# Patient Record
Sex: Male | Born: 1950 | Race: Black or African American | Hispanic: No | Marital: Married | Smoking: Former smoker
Health system: Southern US, Community
[De-identification: ages and names within clinical notes are randomized; demographics above are authoritative.]

## PROBLEM LIST (undated history)

## (undated) DIAGNOSIS — G959 Disease of spinal cord, unspecified: Secondary | ICD-10-CM

## (undated) DIAGNOSIS — I10 Essential (primary) hypertension: Secondary | ICD-10-CM

## (undated) DIAGNOSIS — N200 Calculus of kidney: Secondary | ICD-10-CM

## (undated) DIAGNOSIS — T783XXA Angioneurotic edema, initial encounter: Secondary | ICD-10-CM

## (undated) DIAGNOSIS — G245 Blepharospasm: Secondary | ICD-10-CM

## (undated) DIAGNOSIS — N400 Enlarged prostate without lower urinary tract symptoms: Secondary | ICD-10-CM

## (undated) DIAGNOSIS — H469 Unspecified optic neuritis: Secondary | ICD-10-CM

## (undated) DIAGNOSIS — E785 Hyperlipidemia, unspecified: Secondary | ICD-10-CM

## (undated) DIAGNOSIS — L509 Urticaria, unspecified: Secondary | ICD-10-CM

## (undated) HISTORY — DX: Disease of spinal cord, unspecified: G95.9

## (undated) HISTORY — DX: Benign prostatic hyperplasia without lower urinary tract symptoms: N40.0

## (undated) HISTORY — DX: Angioneurotic edema, initial encounter: T78.3XXA

## (undated) HISTORY — DX: Urticaria, unspecified: L50.9

## (undated) HISTORY — DX: Blepharospasm: G24.5

## (undated) HISTORY — DX: Calculus of kidney: N20.0

## (undated) HISTORY — DX: Unspecified optic neuritis: H46.9

## (undated) HISTORY — DX: Hyperlipidemia, unspecified: E78.5

## (undated) HISTORY — PX: TONSILLECTOMY: SUR1361

---

## 2013-04-08 HISTORY — PX: NECK SURGERY: SHX720

## 2013-06-03 ENCOUNTER — Ambulatory Visit (HOSPITAL_COMMUNITY): Payer: Self-pay | Admitting: Physical Therapy

## 2013-06-07 ENCOUNTER — Ambulatory Visit (HOSPITAL_COMMUNITY)
Admission: RE | Admit: 2013-06-07 | Discharge: 2013-06-07 | Disposition: A | Discharge status: Home / Self Care | Payer: Non-veteran care | Source: Ambulatory Visit | Attending: Nurse Practitioner | Admitting: Nurse Practitioner

## 2013-06-07 DIAGNOSIS — IMO0001 Reserved for inherently not codable concepts without codable children: Secondary | ICD-10-CM | POA: Insufficient documentation

## 2013-06-07 DIAGNOSIS — M2569 Stiffness of other specified joint, not elsewhere classified: Secondary | ICD-10-CM | POA: Insufficient documentation

## 2013-06-07 DIAGNOSIS — M62838 Other muscle spasm: Secondary | ICD-10-CM

## 2013-06-07 DIAGNOSIS — M5380 Other specified dorsopathies, site unspecified: Secondary | ICD-10-CM | POA: Insufficient documentation

## 2013-06-07 DIAGNOSIS — M2560 Stiffness of unspecified joint, not elsewhere classified: Secondary | ICD-10-CM

## 2013-06-07 DIAGNOSIS — M542 Cervicalgia: Secondary | ICD-10-CM | POA: Insufficient documentation

## 2013-06-07 DIAGNOSIS — R209 Unspecified disturbances of skin sensation: Secondary | ICD-10-CM | POA: Insufficient documentation

## 2013-06-07 NOTE — Evaluation (Signed)
Physical Therapy Evaluation  Patient Details  Name: Randy [name] MRN: [id number] Date of Birth: 1950/04/18  Today's Date: 06/07/2013 Time: 1015-1100 PT Time Calculation (min): 45 min Charge: Evaluation 1015-1100             Visit#: 1 of 9  Re-eval: 07/07/13 Assessment Diagnosis: cervical laminectomy Surgical Date: 04/15/13 Next MD Visit: 06/16/2013  [facility]      Authorization Visit#: 1 of 9 (8 visits after initial evaluation approved.)   Past Medical History: No past medical history on file. Past Surgical History: No past surgical history on file.  Subjective Symptoms/Limitations Symptoms: Randy [name] states that he initially had numbness in his fingers and they thought he had carpal tunnel.  He was not improving so they completed an MRI which showed severe spinal stenosis.  He had surgery on 1/8/205 for this and is now being referred for theapy.  Randy [name] states that he fells like his neck is stiff and his hands feel like hard plastic.  He is taking pain medication and mm relaxors on a daily basis  when he does not take his medication the pain is severe but with the medication the pain is tolerable.  He states he has quite a bit of restriction in his arms as far as motion and strength the right being greater than the left.  (Pt does have a referral for OT for B UE ROM and strengthening.).  He staes he has shooting pains going down his arms a couple of times a day.   Pertinent History: C3-C7 posterior laminectomy on 04/15/2013.  Pt is to have only gentle ROM for Cervical spine to prevent further loss of ROM.    How long can you sit comfortably?: Pt begins to shift his weight after about 20-30 minutes. How long can you stand comfortably?: Pt has increased pain after standing for 5-10 minutes  How long can you walk comfortably?: Pt states he is walking ten minutes three times a day.  Pain Assessment Currently in Pain?: Yes Pain Score: 2  (worst pain is as high as  5/10) Pain Location: Neck Pain Orientation: Right Pain Type: Surgical pain Pain Radiating Towards: B UE Pain Relieving Factors: lying down  Effect of Pain on Daily Activities: increases pain    Balance Screening Balance Screen Has the patient fallen in the past 6 months: No  Prior Functioning  Prior Function Driving: Yes (limited) Vocation: Retired Leisure: Hobbies-yes (Comment) Comments: dancing,shopping    Sensation/Coordination/Flexibility/Functional Tests Functional Tests Functional Tests: FS 41  Assessment Cervical AROM Cervical Flexion: 35 degrees Cervical Extension: 55 degrees Cervical - Right Side Bend: 18 Cervical - Left Side Bend: 20 Cervical - Right Rotation: 40 Cervical - Left Rotation: 35 Palpation Palpation: mm spasm  B mid trap area   Exercise/Treatments Mobility/Balance  Posture/Postural Control Posture/Postural Control: Postural limitations Postural Limitations: Pt stands with 20 degrees forward bend; forward head and rounded shoulders   Seated Exercises Shoulder Shrugs: 5 reps Other Seated Exercise: Cervical ROM x 5 for all; scapular retraction x 10     Physical Therapy Assessment and Plan PT Assessment and Plan Clinical Impression Statement: Randy [name] is a 63 yo male who had cervical laminectomies C 3-C7 on 04/15/2013.  He is being referred for gentle ROM for his cevical spine.  Pt eval demonstrates decreased ROM, decreased posture, and marked mm spasms in the mid trapezius B.  Mr Sampson [name] will benefit from skilled PT to educate on the importance of posture; improve his ROM and pain  and decrease his mm spasm to allow him to decrease the use of pain medication amd mm relaxors.   Pt will benefit from skilled therapeutic intervention in order to improve on the following deficits: Decreased activity tolerance;Decreased strength;Pain;Decreased range of motion;Increased fascial restricitons;Improper spinal/pelvic alignment Rehab Potential: Good PT  Frequency: Min 2X/week PT Duration: 4 weeks PT Treatment/Interventions: Therapeutic exercise;Patient/family education;Manual techniques;Modalities PT Plan: Pt to continue ROM exercises as well as postural stabillity exercises; manual and modalities to cervical area to reduce mm spasm and pain.      Goals Home Exercise Program Pt/caregiver will Perform Home Exercise Program: For increased ROM PT Goal: Perform Home Exercise Program - Progress: Goal set today PT Short Term Goals Time to Complete Short Term Goals: 2 weeks PT Short Term Goal 1: Pt to be comfortable sitting x 30 minutes in order to eat a meal  PT Short Term Goal 2: Pt to be able to stand for 20 minutes to be able to make a small meal and/or socialize. PT Short Term Goal 3: Pt mm spasm to be moderate to allow improved ROM in cervical spine PT Short Term Goal 4: Pt increase rotation to 45 degree to allow safer driving PT Long Term Goals Time to Complete Long Term Goals: 4 weeks PT Long Term Goal 1: I in advance HEP PT Long Term Goal 2: Pt to be able to stand for 40 mintues to make an indepth meal/socialize Long Term Goal 3: Pt standing posture to not to be forward bent any greater than five degrees Long Term Goal 4: Pt to be able to complete functional tasks with UE while stabilizing his spine to allow pain level to decrease to the point pt is not taking pain medication on a regular basis. PT Long Term Goal 5: ROM of cervical spine to be to 50 degrees to allow safer driving.  Problem List Patient Active Problem List   Diagnosis Date Noted  . Stiffness of joints, not elsewhere classified, multiple sites 06/07/2013  . Cervical pain (neck) 06/07/2013  . Spasm of muscle 06/07/2013    PT Plan of Care PT Home Exercise Plan: given  GP Functional Assessment Tool Used: foto Functional Limitation: Other PT primary Other PT Primary Current Status (R6045): At least 40 percent but less than 60 percent impaired, limited or  restricted Other PT Primary Goal Status (W0981): At least 20 percent but less than 40 percent impaired, limited or restricted  [name],CINDY 06/07/2013, 1:27 PM  Physician Documentation Your signature is required to indicate approval of the treatment plan as stated above.  Please sign and either send electronically or make a copy of this report for your files and return this physician signed original.   Please mark one 1.__approve of plan  2. ___approve of plan with the following conditions.   ______________________________                                                          _____________________ Physician Signature  Date  

## 2013-06-09 ENCOUNTER — Ambulatory Visit (HOSPITAL_COMMUNITY)
Admission: RE | Admit: 2013-06-09 | Discharge: 2013-06-09 | Disposition: A | Discharge status: Home / Self Care | Payer: Non-veteran care | Source: Ambulatory Visit | Attending: Nurse Practitioner | Admitting: Nurse Practitioner

## 2013-06-09 NOTE — Progress Notes (Signed)
Physical Therapy Treatment Patient Details  Name: Randy [name] MRN: [id number] Date of Birth: [birth year]  Today's Date: 06/09/2013 Time: 0932-1018 PT Time Calculation (min): 46 min Charges: 30min, 9:32- 10:02, 2 units Therapeutic exercise; 16min, 10:03-10:18, 1 unit Manual therapy  Visit#: 2 of 9  Re-eval: 07/07/13  [facility]   Authorization Visit#: 2 of 9 (8 visits after initial evaluation approved.)   Subjective: Pain Assessment Pain Score: 5  Pain Location: Neck Pain Type: Surgical pain Pain Radiating Towards: B UE, Numbness and tingling  Exercise/Treatments Stretches Upper Trapezius Stretch: 3 reps;30 seconds (pain at end range) Standing Exercises Neck Retraction: 10 reps;5 secs Other Standing Exercises: At wall: Cervical retraction in to towel, with abdominal contraction, and Bilateral UE flexion x10 Seated Exercises Other Seated Exercise: Cervical ROM x 5 for all; scapular retraction x 10 (pain with c-xpine extension)  Manual Therapy Manual Therapy: Myofascial release Myofascial Release: MFR completed to bilateral mid and upper trapezius to decrease fascial restrictions; scar massage completed to incision area to improve mobility and decrease pain.  Physical Therapy Assessment and Plan PT Assessment and Plan Clinical Impression Statement: PTA facilitated strengthening and stretching activities to improve postural strength and cervical mobility. Pt requires multimodal cueing to improve form with scapular strengthening. Significant tightness and fascial restrictions noted throughout bilateral mid and upper trap, right > left. Restrictions also noted throughout scalenes. Pt reports pain decrease to 3-4/10 at end of session. Pt will benefit from skilled therapeutic intervention in order to improve on the following deficits: Decreased activity tolerance;Decreased strength;Pain;Decreased range of motion;Increased fascial restricitons;Improper spinal/pelvic  alignment Rehab Potential: Good PT Frequency: Min 2X/week PT Duration: 4 weeks PT Treatment/Interventions: Therapeutic exercise;Patient/family education;Manual techniques;Modalities PT Plan: Pt to continue ROM exercises as well as postural stabillity exercises specifically addre4ssing limited Pectoral, upper trapezius and scalene muscle mobility; manual and modalities to cervical area to reduce mm spasm and pain. Attempt ground up AROM exercises to imrpove pain free cervical ROM.      Problem List Patient Active Problem List   Diagnosis Date Noted  . Stiffness of joints, not elsewhere classified, multiple sites 06/07/2013  . Cervical pain (neck) 06/07/2013  . Spasm of muscle 06/07/2013    PT - End of Session Activity Tolerance: Patient tolerated treatment well General Behavior During Therapy: [facility] SWFL for tasks assessed/performed  DeWitt, Cash DPT, PT  Seth Bakeebekah Carnisha Feltz, PTA 06/09/2013, 10:29 AM

## 2013-06-14 ENCOUNTER — Ambulatory Visit (HOSPITAL_COMMUNITY)
Admission: RE | Admit: 2013-06-14 | Discharge: 2013-06-14 | Disposition: A | Discharge status: Home / Self Care | Payer: Non-veteran care | Source: Ambulatory Visit | Attending: Nurse Practitioner | Admitting: Nurse Practitioner

## 2013-06-14 NOTE — Progress Notes (Signed)
Physical Therapy Treatment Patient Details  Name: Randy [name] MRN: [id number] Date of Birth: 01-27-1951  Today's Date: 06/14/2013 Time: 0930-1015 PT Time Calculation (min): 45 min Charges: Therex x 40'([id number]'(0934-1004) Manual x 10'(1005-1010)  Visit#: 3 of 9  Re-eval: 07/07/13  [facility]   Authorization Visit#: 3 of 9 (8 visits after initial evaluation approved.)   Subjective: Symptoms/Limitations Symptoms: Pt reports HEP compliance. Pt states that he is having more pain in right hip that anywhere. Pain Assessment Currently in Pain?: Yes Pain Score: 5  Pain Location: Neck Pain Orientation: Right Pain Radiating Towards: R hand Multiple Pain Sites: Yes   Exercise/Treatments Stretches Corner Stretch: 3 reps;Limitations Corner Stretch Limitations: 15" holds Standing Exercises Upper Extremity Flexion with Stabilization: 10 reps Other Standing Exercises: Angel wings x 10 Seated Exercises Cervical Rotation: 10 reps Lateral Flexion: 10 reps X to V: 15 reps W Back: 10 reps Other Seated Exercise: Cervical extension/flexion x 10   Manual Therapy Manual Therapy: Myofascial release Myofascial Release: MFR completed to bilateral mid and upper trapezius to decrease fascial restrictions; scar massage completed to incision area to improve mobility and decrease pain.  Physical Therapy Assessment and Plan PT Assessment and Plan Clinical Impression Statement: Pt completes therex well after initial cueing and demo. Pt displays improved cervical and shoulder mobility this session. Continued with manual techniques to decrease fascial restrictions and pain. Pt reports decrease cervical pain at end of session. Pt will benefit from skilled therapeutic intervention in order to improve on the following deficits: Decreased activity tolerance;Decreased strength;Pain;Decreased range of motion;Increased fascial restricitons;Improper spinal/pelvic alignment Rehab Potential: Good PT Frequency:  Min 2X/week PT Duration: 4 weeks PT Treatment/Interventions: Therapeutic exercise;Patient/family education;Manual techniques;Modalities PT Plan: Continue to progress ROM exercises as well as postural stability exercises specifically addressing limited pectoral, upper trapezius and scalene muscle mobility.     Problem List Patient Active Problem List   Diagnosis Date Noted  . Stiffness of joints, not elsewhere classified, multiple sites 06/07/2013  . Cervical pain (neck) 06/07/2013  . Spasm of muscle 06/07/2013    PT - End of Session Activity Tolerance: Patient tolerated treatment well General Behavior During Therapy: [facility] tasks assessed/performed  Seth Bakeebekah Keison Glendinning, PTA  06/14/2013, 10:30 AM

## 2013-06-16 ENCOUNTER — Ambulatory Visit (HOSPITAL_COMMUNITY): Payer: Non-veteran care | Admitting: *Deleted

## 2013-06-18 ENCOUNTER — Ambulatory Visit (HOSPITAL_COMMUNITY)
Admission: RE | Admit: 2013-06-18 | Discharge: 2013-06-18 | Disposition: A | Discharge status: Home / Self Care | Payer: Non-veteran care | Source: Ambulatory Visit | Attending: Nurse Practitioner | Admitting: Nurse Practitioner

## 2013-06-18 DIAGNOSIS — M2560 Stiffness of unspecified joint, not elsewhere classified: Secondary | ICD-10-CM

## 2013-06-18 DIAGNOSIS — M542 Cervicalgia: Secondary | ICD-10-CM

## 2013-06-18 DIAGNOSIS — M62838 Other muscle spasm: Secondary | ICD-10-CM

## 2013-06-18 NOTE — Progress Notes (Signed)
Physical Therapy Treatment Patient Details  Name: Randy [name] MRN: [id number] Date of Birth: 28-Mar-1951  Today's Date: 06/18/2013 Time: 1515-1600 PT Time Calculation (min): 45 min 1515- 1545 manual  0174 - 1600 TE  Visit#: 4 of 9  Re-eval: 07/07/13 Assessment Diagnosis: cervical laminectomy Surgical Date: 04/15/13 Next MD Visit: 06/16/2013  [facility]: [facility]   Authorization Time Period:    Authorization Visit#: 4 of 9   Subjective: Symptoms/Limitations Symptoms: denies neck pain but reports ongoing right hip pain, reports UE use is improving  Pertinent History: C3-C7 posterior laminectomy on 04/15/2013.  Pt is to have only gentle ROM for Cervical spine to prevent further loss of ROM.        Exercise/Treatment    Environmental education officer: 3 reps;Limitations Warehouse manager Limitations: 15" holds, verbal cues for form  Machines for Strengthening UBE (Upper Arm Bike): 3 min forward, 3 min retro    Standing Exercises Other Standing Exercises: Angel wings x 10 Other Standing Exercises: standing shoulder extension at wall with scap retraction 10x  Seated Exercises Cervical Rotation: 10 reps Lateral Flexion: 10 reps W Back: 10 reps Shoulder Shrugs: 10 reps;Limitations Shoulder Shrugs Limitations: supine scap elevation and depression  Other Seated Exercise: Cervical extension/flexion x 10  Supine Exercises Other Supine Exercise: supine shoulder protraction/retractions 10x,      Manual Therapy Manual Therapy: Myofascial release Myofascial Release: MFR to Bil upper trap to decrease fascial restrictions, scar massage, gentle MFR to pectoral muscles, PROM supine rotations, gentle PROM side bending x 30 min   Physical Therapy Assessment and Plan PT Assessment and Plan Clinical Impression Statement: patient has notable spasm in B UE , however cervical ROM  Is mprovnig along with functional use of his UE, he did need min assist to doff shirt, abel to place hands behind and on  top of head , PT Plan: Continue to progress ROM exercises as well as postural stability exercises specifically addressing limited pectoral, upper trapezius and scalene muscle mobility.    Goals PT Short Term Goals Time to Complete Short Term Goals: 2 weeks PT Short Term Goal 1: Pt to be comfortable sitting x 30 minutes in order to eat a meal  PT Short Term Goal 1 - Progress: Progressing toward goal PT Short Term Goal 2: Pt to be able to stand for 20 minutes to be able to make a small meal and/or socialize. PT Short Term Goal 2 - Progress: Progressing toward goal PT Short Term Goal 3: Pt mm spasm to be moderate to allow improved ROM in cervical spine PT Short Term Goal 3 - Progress: Progressing toward goal PT Short Term Goal 4 - Progress: Met PT Long Term Goals Time to Complete Long Term Goals: 4 weeks PT Long Term Goal 1: I in advance HEP PT Long Term Goal 1 - Progress: Progressing toward goal PT Long Term Goal 2: Pt to be able to stand for 40 mintues to make an indepth meal/socialize PT Long Term Goal 2 - Progress: Progressing toward goal Long Term Goal 3: Pt standing posture to not to be forward bent any greater than five degrees Long Term Goal 3 Progress: Progressing toward goal Long Term Goal 4: Pt to be able to complete functional tasks with UE while stabilizing his spine to allow pain level to decrease to the point pt is not taking pain medication on a regular basis. Long Term Goal 4 Progress: Progressing toward goal PT Long Term Goal 5: ROM of cervical spine to be to 50 degrees  to allow safer driving. Long Term Goal 5 Progress: Progressing toward goal  Problem List Patient Active Problem List   Diagnosis Date Noted  . Stiffness of joints, not elsewhere classified, multiple sites 06/07/2013  . Cervical pain (neck) 06/07/2013  . Spasm of muscle 06/07/2013    PT - End of Session Activity Tolerance: Patient tolerated treatment well PT Plan of Care Consulted and Agree with Plan  of Care: Patient  GP    Yakir Wenke 06/18/2013, 5:05 PM

## 2013-06-21 ENCOUNTER — Ambulatory Visit (HOSPITAL_COMMUNITY)
Admission: RE | Admit: 2013-06-21 | Discharge: 2013-06-21 | Disposition: A | Discharge status: Home / Self Care | Payer: Non-veteran care | Source: Ambulatory Visit | Attending: Nurse Practitioner | Admitting: Nurse Practitioner

## 2013-06-21 NOTE — Progress Notes (Signed)
Physical Therapy Treatment Patient Details  Name: Randy MarkerJohn [name] MRN: [id number] Date of Birth: 09/19/1950  Today's Date: 06/21/2013 Time: 1020-1102 PT Time Calculation (min): 42 min  Visit#: 5 of 9  Re-eval: 07/07/13 [facility]ization[facility]   Authorization Visit#: 5 of 9  Charges:  therex 1020-1048 (28') manual 1050-1102 (12')  Subjective: Symptoms/Limitations Symptoms: Pt states his Rt hip hurts but hasn't gotten treatment approved for that yet.  States his Rt shoulder hurts and has stiffness in his neck. Pain Assessment Currently in Pain?: Yes Pain Score: 3  Pain Location: Shoulder Pain Orientation: Right   Exercise/Treatments Stretches Corner Stretch: 3 reps;Limitations Research officer, political partyCorner Stretch Limitations: 15" holds Machines for Strengthening UBE (Upper Arm Bike): 6' total: 3 min forward, 3 min retro  Standing Exercises Neck Retraction: 10 reps;5 secs Upper Extremity Flexion with Stabilization: 10 reps Other Standing Exercises: cerv retr against towel on wall 10X5" holds Other Standing Exercises: elbow pressess in corner 10X5" holds Seated Exercises W Back: 10 reps Shoulder Shrugs: 10 reps;Limitations Other Seated Exercise: Cervical extension/flexion x 10    Manual Therapy Manual Therapy: Myofascial release Myofascial Release: MFR to Bil upper trap to decrease fascial restrictions, scar massage, gentle MFR to pectoral muscles, PROM supine rotations, gentle PROM side bending  Physical Therapy Assessment and Plan PT Assessment and Plan Clinical Impression Statement: Pt continues to have extreme tightness around cervical scar extending into bilateral upper traps.  Able to decrease fascial restrictions with manual techniques and maintain good posture with minimal cues during therex.  Major concern continues to be his hip/lumbar regions, which he is still awaiting approval to begin therapy for. PT Plan: Continue to progress ROM exercises as well as postural stability exercises  specifically addressing limited pectoral, upper trapezius and scalene muscle mobility.  Add wall push ups and wall wash/thumb tacks next visit.     Problem List Patient Active Problem List   Diagnosis Date Noted  . Stiffness of joints, not elsewhere classified, multiple sites 06/07/2013  . Cervical pain (neck) 06/07/2013  . Spasm of muscle 06/07/2013    PT - End of Session Activity Tolerance: Patient tolerated treatment well General Behavior During Therapy: [facility]WFL for tasks assessed/performed PT Plan of Care Consulted and Agree with Plan of Care: Patient   Lurena Nidamy B Amiah Frohlich, PTA/CLT 06/21/2013, 12:08 PM

## 2013-06-23 ENCOUNTER — Ambulatory Visit (HOSPITAL_COMMUNITY)
Admission: RE | Admit: 2013-06-23 | Discharge: 2013-06-23 | Disposition: A | Discharge status: Home / Self Care | Payer: Non-veteran care | Source: Ambulatory Visit | Attending: Nurse Practitioner | Admitting: Nurse Practitioner

## 2013-06-23 NOTE — Progress Notes (Signed)
Physical Therapy Treatment Patient Details  Name: Randy [name] MRN: [id number] Date of Birth: 06-09-1950  Today's Date: 06/23/2013 Time: [id number] PT Time Calculation (min): 43 min  Visit#: 6 of 9  Re-eval: 07/07/13 [facility]ization: [facility]   Authorization Visit#: 6 of 9  Charges:  therex [phone] (28'), manual 1001-1015 (14')  Subjective: Symptoms/Limitations Symptoms: Pt states pain in his neck/shoulder is no greater than 3/10.  States he's trying to take less of his oxycotin.  Reports he went to MD yesterday regarding his hip and thinks may be coming from his back.  MD is ordering xrays prior to deciding to begin therapy on lumbar/hip. Pain Assessment Currently in Pain?: Yes Pain Score: 3  Pain Location: Shoulder   Exercise/Treatments Stretches Corner Stretch: 3 reps;Limitations Research officer, political partyCorner Stretch Limitations: 15" holds Machines for Strengthening UBE (Upper Arm Bike): 6' total: 3 min forward, 3 min retro  Standing Exercises Neck Retraction: 10 reps;5 secs Upper Extremity Flexion with Stabilization: 10 reps Wall Wash: 1 minute cw/ccw Thumb Tacks: 1 minute Other Standing Exercises: elbow pressess in corner 10X5" holds Seated Exercises W Back: 10 reps Shoulder Shrugs: 10 reps;Limitations Other Seated Exercise: Cervical extension/flexion x 10      Manual Therapy Manual Therapy: Myofascial release Myofascial Release: MFR to Bil upper trap to decrease fascial restrictions, scar massage, gentle MFR to pectoral muscles, PROM supine rotations, gentle PROM side bending  Physical Therapy Assessment and Plan PT Assessment and Plan Clinical Impression Statement: Began Rt UE stab exercises including wall wash and thumb tack activity.  Pt able to tolerate 1 minute without c/o pain or difficulty.  Continued fascial restrictions and large spasm in Rt deltoid decreased with MFR techniques.  No pain at end of session. PT Plan: Continue to progress ROM exercises as well as postural stability  exercises specifically addressing limited pectoral, upper trapezius and scalene muscle mobility.       Problem List Patient Active Problem List   Diagnosis Date Noted  . Stiffness of joints, not elsewhere classified, multiple sites 06/07/2013  . Cervical pain (neck) 06/07/2013  . Spasm of muscle 06/07/2013    PT - End of Session Activity Tolerance: Patient tolerated treatment well General Behavior During Therapy: WFL for tasks assessed/performed   Lurena NidaAmy B Frazier, PTA/CLT 06/23/2013, 10:18 AM

## 2013-06-28 ENCOUNTER — Ambulatory Visit (HOSPITAL_COMMUNITY)
Admission: RE | Admit: 2013-06-28 | Discharge: 2013-06-28 | Disposition: A | Discharge status: Home / Self Care | Payer: Non-veteran care | Source: Ambulatory Visit | Attending: Nurse Practitioner | Admitting: Nurse Practitioner

## 2013-06-28 NOTE — Progress Notes (Signed)
Physical Therapy Re-evaluation/ Discharge  Patient Details  Name: Jeanluc Wegman MRN: [id number] Date of Birth: 1950/12/13  Today's Date: 06/28/2013 Time: 8182-9937 PT Time Calculation (min): 44 min              Visit#: 7 of 9  Re-eval: 07/07/13 Diagnosis: cervical laminectomy Surgical Date: 04/15/13 Next MD Visit: 06/16/2013 [facility]ization: [facility]     Authorization Visit#: 7 of 9  Charges:  Therex [phone] (23'), ROM testing [phone], self care [phone] (12')   Subjective Symptoms/Limitations Symptoms: Pt states that the pain in his neck is minimal. He is most limited by radicular hip pain. He states that he is scheduled for an MRI regarding nerve pain. Pain Assessment Currently in Pain?: Yes Pain Score: 1  Pain Location: Neck Pain Orientation: Posterior   Objective: Sensation/Coordination/Flexibility/Functional Tests Functional Tests Functional Tests: FS 56 (was 41)  Cervical AROM Cervical Flexion: 35 degrees (was 35) Cervical Extension: 55 degrees (was 55) Cervical - Right Side Bend: 25 (was 18 degrees) Cervical - Left Side Bend: 25 (was 20 degrees) Cervical - Right Rotation: 55 (was 40 degrees) Cervical - Left Rotation: 55 (was 35 degrees)  Exercise/Treatments Stretches Corner Stretch: 3 reps;Limitations Warehouse manager Limitations: 15" holds Machines for Strengthening UBE (Upper Arm Bike): 6' total: 3 min forward, 3 min retro  Standing Exercises Neck Retraction: 10 reps;5 secs Upper Extremity Flexion with Stabilization: 10 reps Wall Wash: 2 minute cw/ccw Thumb Tacks: 2 minutes Other Standing Exercises: wall push ups 10 reps Other Standing Exercises: elbow pressess in corner 10X5" holds    Physical Therapy Assessment and Plan PT Assessment:  Pt has completed 7 visits following cervical laminectomy.  Pt has progressed well overall, meeting all STG's and 4/5 LTG's.  Lumbar radiculopathy is mostly limiting patient function with none related to cervical.  Pt is  independent with HEP and feels he is ready for discharge. Pt hopes to begin therapy for his back as soon as xrays are completed.  PT Plan: Recommend discharge to HEP.    Goals Home Exercise Program Pt/caregiver will Perform Home Exercise Program: For increased ROM PT Goal: Perform Home Exercise Program - Progress: Met  PT Short Term Goals Time to Complete Short Term Goals: 2 weeks PT Short Term Goal 1: Pt to be comfortable sitting x 30 minutes in order to eat a meal  PT Short Term Goal 1 - Progress: Met PT Short Term Goal 2: Pt to be able to stand for 20 minutes to be able to make a small meal and/or socialize. PT Short Term Goal 2 - Progress: Met  PT Short Term Goal 3: Pt mm spasm to be moderate to allow improved ROM in cervical spine PT Short Term Goal 3 - Progress: Met PT Short Term Goal 4 - Progress: Met  PT Long Term Goals Time to Complete Long Term Goals: 4 weeks PT Long Term Goal 1: I in advance HEP PT Long Term Goal 1 - Progress: Met PT Long Term Goal 2: Pt to be able to stand for 40 mintues to make an indepth meal/socialize PT Long Term Goal 2 - Progress: not Met (unable due to lumbar/Rt hip pain) Long Term Goal 3: Pt standing posture to not to be forward bent any greater than five degrees Long Term Goal 3 Progress: Met Long Term Goal 4: Pt to be able to complete functional tasks with UE while stabilizing his spine to allow pain level to decrease to the point pt is not taking pain medication on a regular  basis. Long Term Goal 4 Progress: Met PT Long Term Goal 5: ROM of cervical spine rotation to be to 50 degrees to allow safer driving. Long Term Goal 5 Progress: Met  Problem List Patient Active Problem List   Diagnosis Date Noted  . Stiffness of joints, not elsewhere classified, multiple sites 06/07/2013  . Cervical pain (neck) 06/07/2013  . Spasm of muscle 06/07/2013    PT - End of Session Activity Tolerance: Patient tolerated treatment well General Behavior During  Therapy: WFL for tasks assessed/performed  GP Functional Assessment Tool Used: FOTO 56 (was 41) Functional Limitation: Other PT primary Other PT Primary Goal Status (Z1696): At least 20 percent but less than 40 percent impaired, limited or restricted Other PT Primary Discharge Status [phone]): At least 20 percent but less than 40 percent impaired, limited or restricted  Teena Irani, PTA/CLT 06/28/2013, 10:18 AM

## 2013-08-31 NOTE — Addendum Note (Signed)
Encounter addended by: Jeanene Erb, OT on: 08/31/2013 12:16 PM<BR>     Documentation filed: Episodes

## 2016-06-04 ENCOUNTER — Encounter (HOSPITAL_COMMUNITY): Payer: Self-pay | Admitting: Physical Therapy

## 2016-06-04 ENCOUNTER — Ambulatory Visit (HOSPITAL_COMMUNITY): Payer: Non-veteran care | Attending: Nurse Practitioner | Admitting: Physical Therapy

## 2016-06-04 DIAGNOSIS — R2681 Unsteadiness on feet: Secondary | ICD-10-CM | POA: Diagnosis present

## 2016-06-04 NOTE — Therapy (Signed)
[facility] [address] [place], [place], [address] Phone: [phone]   Fax:  [phone]  Physical Therapy Evaluation (one time visit)  Patient Details  Name: Arlester MarkerJohn Muralles MRN: [id number] Date of Birth: September 21, 1950 Referring Provider: Steward RosPeggy Cassade, MD  Encounter Date: 06/04/2016      PT End of Session - 06/04/16 1728    Visit Number 1   Number of Visits 1   [facility]ization Type [facility]   Authorization - Visit Number 1   Authorization - Number of Visits 14   PT Start Time 1402   PT Stop Time 1434   PT Time Calculation (min) 32 min   Activity Tolerance Patient tolerated treatment well;No increased pain   Behavior During Therapy [facility] for tasks assessed/performed      History reviewed. No pertinent past medical history.  History reviewed. No pertinent surgical history.  There were no vitals filed for this visit.       Subjective Assessment - 06/04/16 1404    Subjective Pt reports that he has arthritis throughout his body. Sometimes he has numbness in his legs and his buttock will ache so bad that he can't sleep. He does not feel that he has many issues with his balance, but he was sent because he failed a balance test with his physical therapist.    Pertinent History HTN, high cholesterol, Cx fusion surgery 2015,    How long can you sit comfortably? unlimited    How long can you stand comfortably? unlimited   How long can you walk comfortably? unlimited   Currently in Pain? No/denies            [facility] - 06/04/16 0001      Assessment   Medical Diagnosis Impaired balance    Referring Provider Steward RosPeggy Cassade, MD   Next MD Visit April   Prior Therapy OPPT for his neck      Balance Screen   Has the patient fallen in the past 6 months No  1 close fall    Has the patient had a decrease in activity level because of a fear of falling?  No   Is the patient reluctant to leave their home because of a fear of falling?   No     Prior Function   Level of Independence Independent     Cognition   Overall Cognitive Status Within Functional Limits for tasks assessed     Sensation   Additional Comments Reports residual numbness in his RUE, BLE numbness along anterior thigh     ROM / Strength   AROM / PROM / Strength Strength     Strength   Overall Strength Within functional limits for tasks performed     Transfers   Five time sit to stand comments  8 sec, no UE     Standardized Balance Assessment   Standardized Balance Assessment Berg Balance Test;Dynamic Gait Index     Berg Balance Test   Sit to Stand Able to stand without using hands and stabilize independently   Standing Unsupported Able to stand safely 2 minutes   Sitting with Back Unsupported but Feet Supported on Floor or Stool Able to sit safely and securely 2 minutes   Stand to Sit Sits safely with minimal use of hands   Transfers Able to transfer safely, minor use of hands   Standing Unsupported with Eyes Closed Able to stand 10 seconds safely   Standing Ubsupported with Feet Together Able to place  feet together independently and stand 1 minute safely   From Standing, Reach Forward with Outstretched Arm Can reach confidently >25 cm (10")   From Standing Position, Pick up Object from Floor Able to pick up shoe safely and easily   From Standing Position, Turn to Look Behind Over each Shoulder Looks behind from both sides and weight shifts well   Turn 360 Degrees Able to turn 360 degrees safely in 4 seconds or less   Standing Unsupported, Alternately Place Feet on Step/Stool Able to stand independently and safely and complete 8 steps in 20 seconds   Standing Unsupported, One Foot in Front Able to place foot tandem independently and hold 30 seconds   Standing on One Leg Able to lift leg independently and hold equal to or more than 3 seconds  Rt 3 sec, Lt 9 sec    Total Score 54     High Level Balance   High Level Balance Comments Portions of  DGI completed and within normal limits: x50 ft trials each with head turns Lt/Rt/center, Up/Down/center, stop and pivot. No signs of change in gait speed, LOB or unsteadiness                           PT Education - 06/04/16 1727    Education provided Yes   Education Details eval findings/POC; results of balance testing and encouraged him to pick back up with his daily walking and working on SLS at home.    Person(s) Educated Patient   Methods Explanation;Demonstration;Handout   Comprehension Verbalized understanding;Returned demonstration          PT Short Term Goals - 06/04/16 1737      PT SHORT TERM GOAL #1   Title Pt will verbalize understanding of his HEP to allow him to address minor limitations independently at home.    Time 1   Period Days   Status Achieved           PT Long Term Goals - 06/04/16 1738      PT LONG TERM GOAL #1   Title N/A due to one time visit                Plan - 06/04/16 1729    Clinical Impression Statement Pt is a 66 yo M referred to OPPT for evaluation and management of impaired balance. He arrived late to his appointment and was agreeable with the fact that his evaluation would be limited due to this. He reports history of cervical surgery and low back pain however he denies any unsteadiness or LOB for the past several months. With time allowed, he was able to score 54/56 on the BERG balance test and was able to complete dynamic balance with cognitive challenges without unsteadiness or change in gait speed. Functionally, there are no signs of significant LE weakness. At this time, he shows no significant signs of unsteadiness, per standardized testing, and does not require skilled PT to address impaired balance. Eval findings were discussed and the pt was encouraged to resume daily routines of walking and the provided HEP. He verbalized understanding and agreement with one time visit at this time.    Rehab Potential Good    PT Frequency One time visit   PT Treatment/Interventions ADLs/Self Care Home Management;Therapeutic activities;Balance training   PT Home Exercise Plan Single leg stance, resume walking several days a week    Recommended Other Services none    Consulted and Agree  with Plan of Care Patient      Patient will benefit from skilled therapeutic intervention in order to improve the following deficits and impairments:  Decreased balance  Visit Diagnosis: Unsteadiness on feet     Problem List Patient Active Problem List   Diagnosis Date Noted  . Stiffness of joints, not elsewhere classified, multiple sites 06/07/2013  . Cervical pain (neck) 06/07/2013  . Spasm of muscle 06/07/2013   5:43 PM,06/04/16 Marylyn Ishihara PT, DPT [facility] [phone]  [facility] [address] [place], [place], [address] Phone: [phone]   Fax:  [phone]  Name: Kevyn Boquet MRN: [id number] Date of Birth: [birth year]

## 2016-10-01 ENCOUNTER — Ambulatory Visit (INDEPENDENT_AMBULATORY_CARE_PROVIDER_SITE_OTHER): Payer: Non-veteran care | Admitting: Allergy & Immunology

## 2016-10-01 ENCOUNTER — Encounter (INDEPENDENT_AMBULATORY_CARE_PROVIDER_SITE_OTHER): Payer: Self-pay

## 2016-10-01 ENCOUNTER — Encounter: Payer: Self-pay | Admitting: Allergy & Immunology

## 2016-10-01 VITALS — BP 110/70 | HR 96 | Temp 98.3°F | Resp 16 | Ht 69.88 in | Wt 213.4 lb

## 2016-10-01 DIAGNOSIS — J3089 Other allergic rhinitis: Secondary | ICD-10-CM | POA: Diagnosis not present

## 2016-10-01 DIAGNOSIS — L500 Allergic urticaria: Secondary | ICD-10-CM

## 2016-10-01 DIAGNOSIS — T63481D Toxic effect of venom of other arthropod, accidental (unintentional), subsequent encounter: Secondary | ICD-10-CM

## 2016-10-01 DIAGNOSIS — L508 Other urticaria: Secondary | ICD-10-CM | POA: Diagnosis not present

## 2016-10-01 MED ORDER — RANITIDINE HCL 300 MG PO TABS: 300.0000 mg | ORAL_TABLET | Freq: Every day | ORAL | 5 refills | Status: DC

## 2016-10-01 MED ORDER — CETIRIZINE HCL 10 MG PO TABS: 20.0000 mg | ORAL_TABLET | Freq: Two times a day (BID) | ORAL | 5 refills | Status: DC

## 2016-10-01 NOTE — Patient Instructions (Addendum)
1. Chronic urticaria - Testing today was negative to the most common food allergies (peanut, tree nut, soy, fish mix, shellfish mix, wheat, milk, egg), except for soy which was mildly reactive. - We will get blood work to confirm the soy result.  - Testing for environmental allergies was positive for molds, dust mites, cat and dog  - Your history does not have any "red flags" such as fevers, joint pains, or permanent skin changes that would be concerning for a more serious cause of hives.  - We will get some labs to rule out serious causes of hives: complete blood count, tryptase level, chronic urticaria panel, and a complete metabolic panel. - Chronic hives are often times a self limited process and will "burn themselves out" over 6-12 months, although this is not always the case.  - In the meantime, start suppressive dosing of antihistamines:   - Morning: Zyrtec (cetirizine) 20mg  (two tablets)  - Evening: Zyrtec (cetirizine) 20mg  (two tablets)  - If the above is not working, try adding: Zantac (ranitidine) 300mg  (two tablets) - You can change this dosing at home, decreasing the dose as needed or increasing the dosing as needed.  - If you are not tolerating the medications or are tired of taking them every day, we can start treatment with a monthly injectable medication called Xolair.   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 - EpiPen is up to date.   3. Return in about 3 months (around 01/01/2017).   Please inform us of any Emergency Department visits, hospitalizations, or changes in symptoms. Call us before going to the ED for breathing or allergy symptoms since we might be able to fit you in for a sick visit. Feel free to contact us anytime with any questions, problems, or concerns.  It was a pleasure to meet you today! Happy summer!   Websites that have reliable patient information: 1. American Academy of Asthma, Allergy,  and Immunology: www.aaaai.org 2. Food Allergy Research and Education (FARE): foodallergy.org 3. Mothers of Asthmatics: http://www.asthmacommunitynetwork.org 4. American College of Allergy, Asthma, and Immunology: www.acaai.org  Control of Mold Allergen  Mold and fungi can grow on a variety of surfaces provided certain temperature and moisture conditions exist.  Outdoor molds grow on plants, decaying vegetation and soil.  The major outdoor mold, Alternaria and Cladosporium, are found in very high numbers during hot and dry conditions.  Generally, a late Summer - Fall peak is seen for common outdoor fungal spores.  Rain will temporarily lower outdoor mold spore count, but counts rise rapidly when the rainy period ends.  The most important indoor molds are Aspergillus and Penicillium.  Dark, humid and poorly ventilated basements are ideal sites for mold growth.  The next most common sites of mold growth are the bathroom and the kitchen.  Outdoor [employer] 1. Use air conditioning and keep windows closed 2. Avoid exposure to decaying vegetation. 3. Avoid leaf raking. 4. Avoid grain handling. 5. Consider wearing a face mask if working in moldy areas.  Indoor Mold Control 1. Maintain humidity below 50%. 2. Clean washable surfaces with 5% bleach solution. 3. Remove sources e.g. contaminated carpets.  Control of House Dust Mite Allergen    House dust mites play a major role in allergic asthma and rhinitis.  They occur in environments with high humidity wherever human skin, the food for dust mites is found. High levels have been detected in dust obtained  from mattresses, pillows, carpets, upholstered furniture, bed covers, clothes and soft toys.  The principal allergen of the house dust mite is found in its feces.  A gram of dust may contain 1,000 mites and 250,000 fecal particles.  Mite antigen is easily measured in the air during house cleaning activities.    1. Encase mattresses, including  the box spring, and pillow, in an air tight cover.  Seal the zipper end of the encased mattresses with wide adhesive tape. 2. Wash the bedding in water of 130 degrees Farenheit weekly.  Avoid cotton comforters/quilts and flannel bedding: the most ideal bed covering is the dacron comforter. 3. Remove all upholstered furniture from the bedroom. 4. Remove carpets, carpet padding, rugs, and non-washable window drapes from the bedroom.  Wash drapes weekly or use plastic window coverings. 5. Remove all non-washable stuffed toys from the bedroom.  Wash stuffed toys weekly. 6. Have the room cleaned frequently with a vacuum cleaner and a damp dust-mop.  The patient should not be in a room which is being cleaned and should wait 1 hour after cleaning before going into the room. 7. Close and seal all heating outlets in the bedroom.  Otherwise, the room will become filled with dust-laden air.  An electric heater can be used to heat the room. 8. Reduce indoor humidity to less than 50%.  Do not use a humidifier.  Control of Dog or Cat Allergen  Avoidance is the best way to manage a dog or cat allergy. If you have a dog or cat and are allergic to dog or cats, consider removing the dog or cat from the home. If you have a dog or cat but don't want to find it a new home, or if your family wants a pet even though someone in the household is allergic, here are some strategies that may help keep symptoms at bay:  1. Keep the pet out of your bedroom and restrict it to only a few rooms. Be advised that keeping the dog or cat in only one room will not limit the allergens to that room. 2. Don't pet, hug or kiss the dog or cat; if you do, wash your hands with soap and water. 3. High-efficiency particulate air (HEPA) cleaners run continuously in a bedroom or living room can reduce allergen levels over time. 4. Regular use of a high-efficiency vacuum cleaner or a central vacuum can reduce allergen levels. 5. Giving your dog or  cat a bath at least once a week can reduce airborne allergen.

## 2016-10-01 NOTE — Progress Notes (Signed)
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 Chronic urticaria - Testing today was negative to the most common food allergies (peanut, tree nut, soy, fish mix, shellfish mix, wheat, milk, egg), except for soy which was mildly reactive. - We will get blood work to confirm the soy result.  - Testing for environmental allergies was positive for molds, dust mites, cat and dog  - Your history does not have any "red flags" such as fevers, joint pains, or permanent skin changes that would be concerning for a more serious cause of hives.  - We will get some labs to rule out serious causes of hives: complete blood count, tryptase level, chronic urticaria panel, and a complete metabolic panel. - Chronic hives are often times a self limited process and will "burn themselves out" over 6-12 months, although this is not always the case.  - In the meantime, start suppressive dosing of antihistamines:   - Morning: Zyrtec (cetirizine) 20mg  (two tablets)  - Evening: Zyrtec (cetirizine) 20mg  (two tablets)  - If the above is not working, try adding: Zantac (ranitidine) 300mg  (two tablets) - You can change this dosing at home, decreasing the dose as needed or increasing the dosing as needed.  - If you are not tolerating the medications or are tired of taking them every day, we can start treatment with a monthly injectable medication called Xolair.   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 - EpiPen is up to date.   3. Return in about 3 months (around 01/01/2017).   Subjective:   Randy [name] is a 66 y.o. male presenting today for evaluation of  Chief Complaint  Patient presents with  . Urticaria    Randy MarkerJohn  [name] has a history of the following: Patient Active Problem List   Diagnosis Date Noted  . Stiffness of joints, not elsewhere classified, multiple sites 06/07/2013  . Cervical pain (neck) 06/07/2013  . Spasm of muscle 06/07/2013    History obtained from: chart review and patient.  Randy [name] was referred by Garey [name], Peggy, MD.     Randy [name] is a 66 y.o. male presenting for evaluation of hives. He reports that around three months ago, he started having "whelps" on his hands. Then they started coming out on his head. He was given a prescription for EpiPen and told to use the EpiPen if his lips or tongue swell. The hives have been all over. They are very pruritic. They tend to stick around very briefly. They leave normal skin upon resolution. He has not had involvement of his lip or tongue. eE took some Benadryl and it went away quickly. Total duration has been three months. He has not had any kind of workup. He does not think that it is associated with any type of food. There are no new exposures including animals. He lives in an apartment complex with pets all over the place. He has tolerated all of the major food allergens without a problem.    Around 20 years ago, he had a bout of hives. The details are unclear, but he was given a steroid taper with improvement in the symptoms. He had hives on the top of his head at that time. He will have  occasional watery eyes and runny nose, but otherwise no problems. He does not get sinus infections very often. He does have a history of smoking, and has smoked for around 30 years.   He was stung by two yellow jackets when he was 40 and had a anaphylactic reaction to this. He has not carried any EpiPen during that time. He has never been tested for stinging insects. He has just avoided them since that episode.   Otherwise, there is no history of other atopic diseases, including asthma or drug allergies. There is no significant infectious history.  Vaccinations are up to date.    Past Medical History: Patient Active Problem List   Diagnosis Date Noted  . Stiffness of joints, not elsewhere classified, multiple sites 06/07/2013  . Cervical pain (neck) 06/07/2013  . Spasm of muscle 06/07/2013    Medication List:  Allergies as of 10/01/2016   No Known Allergies     Medication List       Accurate as of 10/01/16  3:14 PM. Always use your most recent med list.          atorvastatin 80 MG tablet Commonly known as:  LIPITOR Take 80 mg by mouth daily.   cetirizine 10 MG tablet Commonly known as:  ZYRTEC Take 2 tablets (20 mg total) by mouth 2 (two) times daily.   EPIPEN 2-PAK 0.3 mg/0.3 mL Soaj injection Generic drug:  EPINEPHrine Inject 0.3 mg into the muscle once.   lisinopril 40 MG tablet Commonly known as:  PRINIVIL,ZESTRIL Take 40 mg by mouth daily.   naproxen sodium 275 MG tablet Commonly known as:  ANAPROX Take 275 mg by mouth 2 (two) times daily with a meal.   omeprazole 20 MG capsule Commonly known as:  PRILOSEC Take 20 mg by mouth daily.   ranitidine 300 MG tablet Commonly known as:  ZANTAC Take 1 tablet (300 mg total) by mouth at bedtime.   sildenafil 100 MG tablet Commonly known as:  VIAGRA Take 100 mg by mouth daily as needed for erectile dysfunction.   terazosin 2 MG capsule Commonly known as:  HYTRIN Take 2 mg by mouth at bedtime.       Birth History: non-contributory.   Developmental History: non-contributory.   Past Surgical History: Past Surgical History:  Procedure Laterality Date  . NECK SURGERY  2015  . TONSILLECTOMY       Family History: Family History  Problem Relation Age of Onset  . Allergic rhinitis Neg Hx   . Angioedema Neg Hx   . Asthma Neg Hx   . Atopy Neg Hx   . Eczema Neg Hx   . Immunodeficiency Neg Hx   . Urticaria Neg Hx      Social History: Randy [name] lives at home.      Review of Systems: a 14-point review of systems is pertinent for what is mentioned  in HPI.  Otherwise, all other systems were negative. Constitutional: negative other than that listed in the HPI Eyes: negative other than that listed in the HPI Ears, nose, mouth, throat, and face: negative other than that listed in the HPI Respiratory: negative other than that listed in the HPI Cardiovascular: negative other than that listed in the HPI Gastrointestinal: negative other than that listed in the HPI Genitourinary: negative other than that listed in the HPI Integument: negative other than that listed in the HPI Hematologic: negative other than that listed in the HPI Musculoskeletal: negative other than that listed in the HPI Neurological: negative  other than that listed in the HPI Allergy/Immunologic: negative other than that listed in the HPI    Objective:   Blood pressure 110/70, pulse 96, temperature 98.3 F (36.8 C), temperature source Oral, resp. rate 16, height 5' 9.88" (1.775 m), weight 213 lb 6.4 oz (96.8 kg), SpO2 95 %. Body mass index is 30.72 kg/m.   Physical Exam:  General: Alert, interactive, in no acute distress. Pleasant.  Eyes: No conjunctival injection present on the right, No conjunctival injection present on the left, PERRL bilaterally, No discharge on the right, No discharge on the left and No Horner-Trantas dots present Ears: Right TM pearly gray with normal light reflex, Left TM pearly gray with normal light reflex, Right TM intact without perforation and Left TM intact without perforation.  Nose/Throat: External nose within normal limits and septum midline, turbinates edematous and pale with clear discharge, post-pharynx erythematous with cobblestoning in the posterior oropharynx. Tonsils 2+ without exudates Neck: Supple without thyromegaly.  Adenopathy: No enlarged lymph nodes appreciated in the anterior cervical, occipital, axillary, epitrochlear, inguinal, or popliteal regions. Lungs: Clear to auscultation without wheezing, rhonchi or rales. No  increased work of breathing. CV: Normal S1/S2, no murmurs. Capillary refill <2 seconds.  Abdomen: Nondistended, nontender. No guarding or rebound tenderness. Bowel sounds faint and present in all fields  Skin: Warm and dry, without lesions or rashes. Extremities:  No clubbing, cyanosis or edema. Neuro:   Grossly intact. No focal deficits appreciated. Responsive to questions.  Diagnostic studies:  Allergy Studies:   Indoor/Outdoor Percutaneous Adult Environmental Panel: positive to Candida, Df mite, Dp mites, cat and dog. Otherwise negative with adequate controls.  Most Common Foods Panel (peanut, cashew, soy, fish mix, shellfish mix, wheat, milk, egg): mildly reactive to soy, otherwise negative with adequate controls     Malachi Bonds, MD FAAAAI [facility]

## 2016-10-02 ENCOUNTER — Telehealth: Payer: Self-pay | Admitting: Allergy & Immunology

## 2016-10-02 NOTE — Telephone Encounter (Signed)
Pt called and said that [facility] and they said it was very expensive and need to talk with someone about it is for. [phone]

## 2016-10-02 NOTE — Telephone Encounter (Signed)
Call[place] patient. [place]. Patient wanted to know where to go and get it done at. I informed patient that he can go to the [facility] in [place] across from [facility] and I gave him the address.

## 2016-10-07 LAB — CBC WITH DIFFERENTIAL/PLATELET
BASOS ABS: 0 {cells}/uL (ref 0–200)
Basophils Relative: 0 %
EOS ABS: 168 {cells}/uL (ref 15–500)
Eosinophils Relative: 3 %
HCT: 50.8 % — ABNORMAL HIGH (ref 38.5–50.0)
Hemoglobin: 17.1 g/dL (ref 13.2–17.1)
LYMPHS PCT: 34 %
Lymphs Abs: 1904 cells/uL (ref 850–3900)
MCH: 31.5 pg (ref 27.0–33.0)
MCHC: 33.7 g/dL (ref 32.0–36.0)
MCV: 93.6 fL (ref 80.0–100.0)
MONOS PCT: 9 %
MPV: 9.8 fL (ref 7.5–12.5)
Monocytes Absolute: 504 cells/uL (ref 200–950)
NEUTROS ABS: 3024 {cells}/uL (ref 1500–7800)
Neutrophils Relative %: 54 %
PLATELETS: 216 10*3/uL (ref 140–400)
RBC: 5.43 MIL/uL (ref 4.20–5.80)
RDW: 16.5 % — AB (ref 11.0–15.0)
WBC: 5.6 10*3/uL (ref 3.8–10.8)

## 2016-10-08 LAB — COMPLETE METABOLIC PANEL WITH GFR
ALT: 14 U/L (ref 9–46)
AST: 15 U/L (ref 10–35)
Albumin: 4.2 g/dL (ref 3.6–5.1)
Alkaline Phosphatase: 84 U/L (ref 40–115)
BILIRUBIN TOTAL: 0.5 mg/dL (ref 0.2–1.2)
BUN: 19 mg/dL (ref 7–25)
CHLORIDE: 100 mmol/L (ref 98–110)
CO2: 26 mmol/L (ref 20–31)
Calcium: 9.9 mg/dL (ref 8.6–10.3)
Creat: 1 mg/dL (ref 0.70–1.25)
GFR, Est Non African American: 78 mL/min (ref >=60)
GLUCOSE: 130 mg/dL — AB (ref 65–99)
POTASSIUM: 4.5 mmol/L (ref 3.5–5.3)
SODIUM: 135 mmol/L (ref 135–146)
TOTAL PROTEIN: 7.2 g/dL (ref 6.1–8.1)

## 2016-10-08 LAB — ALLERGEN HYMENOPTERA PANEL
ALLERGEN WHITE HORNET: 0.37 kU/L — AB
Paper Wasp IgE: 0.36 kU/L — ABNORMAL HIGH
Yellow Hornet IgE: 0.2 kU/L — ABNORMAL HIGH
Yellow Jacket IgE: 0.56 kU/L — ABNORMAL HIGH

## 2016-10-08 LAB — TRYPTASE: Tryptase: 8.1 ug/L (ref <11)

## 2016-10-08 LAB — ALLERGEN SOYBEAN

## 2016-10-11 ENCOUNTER — Encounter (HOSPITAL_COMMUNITY): Payer: Self-pay | Admitting: Physical Therapy

## 2016-10-11 ENCOUNTER — Ambulatory Visit (HOSPITAL_COMMUNITY): Payer: Non-veteran care | Attending: Nurse Practitioner | Admitting: Physical Therapy

## 2016-10-11 DIAGNOSIS — R29898 Other symptoms and signs involving the musculoskeletal system: Secondary | ICD-10-CM | POA: Insufficient documentation

## 2016-10-11 DIAGNOSIS — M6281 Muscle weakness (generalized): Secondary | ICD-10-CM | POA: Diagnosis present

## 2016-10-11 DIAGNOSIS — M25511 Pain in right shoulder: Secondary | ICD-10-CM

## 2016-10-11 DIAGNOSIS — R2681 Unsteadiness on feet: Secondary | ICD-10-CM | POA: Diagnosis present

## 2016-10-11 DIAGNOSIS — M25512 Pain in left shoulder: Secondary | ICD-10-CM | POA: Insufficient documentation

## 2016-10-11 DIAGNOSIS — M25612 Stiffness of left shoulder, not elsewhere classified: Secondary | ICD-10-CM | POA: Insufficient documentation

## 2016-10-11 NOTE — Therapy (Signed)
[facility] [address] [place], [place], [address] Phone: [phone]   Fax:  [phone]  Physical Therapy Evaluation  Patient Details  Name: Randy [name] MRN: [id number] Date of Birth: 10-13-1950 Referring Provider: Steward Ros, MD   Encounter Date: 10/11/2016      PT End of Session - 10/11/16 1409    Visit Number 1   Number of Visits 13   Date for PT Re-Evaluation 11/01/16   Authorization Type [facility]   Authorization Time Period 10/11/16 to 11/22/16   PT Start Time 1134  Due to pt late arrival   PT Stop Time 1204   PT Time Calculation (min) 30 min   Activity Tolerance Patient tolerated treatment well;No increased pain   Behavior During Therapy [facility] for tasks assessed/performed      History reviewed. No pertinent past medical history.  Past Surgical History:  Procedure Laterality Date  . NECK SURGERY  2015  . TONSILLECTOMY      There were no vitals filed for this visit.       Subjective Assessment - 10/11/16 1135    Subjective Pt reports that his shoulder has been bothering him for close to 6 months now. He states that the pain could've come on while trying to help lift a TV. Overall, he feels that his shoulder symptoms have remained about the same, with his biggest issue with grip and elevating his arm.    Pertinent History neck surgery 2015   Limitations Lifting   Patient Stated Goals improve strength    Currently in Pain? No/denies            [facility] PT Assessment - 10/11/16 0001      Assessment   Medical Diagnosis Lt bicep tendonitis   Referring Provider Steward Ros, MD    Onset Date/Surgical Date --  ~6 months ago   Hand Dominance Right   Prior Therapy none      Precautions   Precautions None     Balance Screen   Has the patient fallen in the past 6 months No   Has the patient had a decrease in activity level because of a fear of falling?  No   Is the patient reluctant to leave their home because of  a fear of falling?  No     Prior Function   Level of Independence Independent     Observation/Other Assessments   Observations noted atrophy on the Lt shoulder complex compared to the Rt   Other Surveys  Other Surveys   Upper Extremity Functional Index  39/80     Sensation   Additional Comments Rt hand numbness reported for a while      Posture/Postural Control   Posture Comments forward head, rounded shoulders      ROM / Strength   AROM / PROM / Strength AROM;Strength;PROM     AROM   AROM Assessment Site Shoulder   Right/Left Shoulder Right;Left   Right Shoulder Flexion 140 Degrees   Right Shoulder ABduction 140 Degrees   Left Shoulder Flexion 130 Degrees   Left Shoulder ABduction 110 Degrees     PROM   Overall PROM  Within functional limits for tasks performed   Overall PROM Comments pain with end range ABD/Flexion on the Lt      Strength   Overall Strength Comments Lt shoulder scaption 3+/5 (+) pain    Strength Assessment Site Elbow;Shoulder;Hand   Right/Left Shoulder Right;Left   Right Shoulder Flexion 5/5  Right Shoulder ABduction 5/5   Right Shoulder Internal Rotation 5/5   Right Shoulder External Rotation 5/5   Left Shoulder Flexion 3+/5  pain reported    Left Shoulder ABduction 4-/5   Left Shoulder Internal Rotation 4/5   Left Shoulder External Rotation 4/5   Right/Left Elbow Right;Left   Right Elbow Flexion 5/5   Right Elbow Extension 5/5   Left Elbow Flexion 5/5   Left Elbow Extension 5/5   Right/Left hand Right;Left   Right Hand Grip (lbs) 84.3  avg of 3    Left Hand Grip (lbs) 71.3  avg of 3      Palpation   Palpation comment tenderness along biceps tendon, head of biceps       Objective measurements completed on examination: See above findings.          [facility] Adult PT Treatment/Exercise - 10/11/16 0001      Exercises   Exercises Shoulder     Shoulder Exercises: Seated   Retraction 5 reps;Both   Retraction Limitations HEP demo       Shoulder Exercises: Lawyertretch   Corner Stretch 1 rep;30 seconds   Armed forces logistics/support/administrative officerCorner Stretch Limitations HEP demo                 PT Education - 10/11/16 1408    Education provided Yes   Education Details eval findings/POC; implemented and reviewed HEP   Person(s) Educated Patient   Methods Explanation;Demonstration;Handout   Comprehension Verbalized understanding;Returned demonstration          PT Short Term Goals - 10/11/16 1419      PT SHORT TERM GOAL #1   Title Pt will demonstrate consistency and independence with his HEP to decrease pain and improve shoulder ROM.    Time 3   Period Weeks   Status New     PT SHORT TERM GOAL #2   Title Pt will demo improved postural awareness, evident by his ability to self-correct posture during a session, with therapist cuing no more than 50% of the time.    Time 3   Period Weeks   Status New     PT SHORT TERM GOAL #3   Title Pt will demo improved Lt shoulder AROM to WNL of the RLE, to improve his ability to reach overhead during tasks.    Time 3   Period Weeks   Status New           PT Long Term Goals - 10/11/16 1420      PT LONG TERM GOAL #1   Title Pt will demo improved Lt shoulder strength to 5/5 MMT, which will increase his ability to complete daily household activities such as vacuuming and sweeping.    Time 6   Period Weeks   Status New     PT LONG TERM GOAL #2   Title Pt will demo atleast a 9 point improvement on the UEFS, to indicate a significant improvement in functional strength of the UEs.   Time 6   Period Weeks   Status New     PT LONG TERM GOAL #3   Title Pt will demo improved Lt grip strength to within 5lb of the RLE, to improve his ability to open a jar at home.    Time 6   Period Weeks   Status New                Plan - 10/11/16 1410    Clinical Impression Statement Pt is a  pleasant 66 y.o M referred to OPPT for evaluation and management of Lt shoulder pain ongoing for several months after  pt reportedly attempted to help lift a large TV. Pt was late to today's evaluation, so time was limited. He demonstrates limited Lt shoulder AROM, decreased Lt shoulder strength and poor postural control evident by his rounded shoulders and forward head posture. Pt scored 39/80 on the upper extremity functional index, indicating a significantly limited perception of his UE function throughout the day. He would benefit from skilled PT to address his postural limitations and improve his shoulder ROM and strength, to allow for full activity participation at home and in the community. Therapist initiated pt's HEP and pt was able to verbalize understanding.    History and Personal Factors relevant to plan of care: history of Lt and Rt shoulder pain.    Clinical Presentation Stable   Clinical Presentation due to: no change from onset    Clinical Decision Making Low   Rehab Potential Good   Clinical Impairments Affecting Rehab Potential (+) age and motivation to improve    PT Frequency 2x / week   PT Duration 6 weeks   PT Treatment/Interventions ADLs/Self Care Home Management;Cryotherapy;Moist Heat;Therapeutic exercise;Therapeutic activities;Neuromuscular re-education;Patient/family education;Manual techniques;Dry needling;Passive range of motion   PT Next Visit Plan Scapular control; pec stretch; thoracic mobility assess and treat; begin shoulder strengthening progression   PT Home Exercise Plan scap retraction x15 reps; doorway pec stretch 3x30 sec    Consulted and Agree with Plan of Care Patient      Patient will benefit from skilled therapeutic intervention in order to improve the following deficits and impairments:  Decreased range of motion, Decreased strength, Hypomobility, Impaired flexibility, Postural dysfunction, Increased muscle spasms, Improper body mechanics, Pain, Impaired perceived functional ability  Visit Diagnosis: Left shoulder pain, unspecified chronicity  Right shoulder pain,  unspecified chronicity  Muscle weakness (generalized)  Other symptoms and signs involving the musculoskeletal system  Stiffness of left shoulder, not elsewhere classified      G-Codes - 10/20/16 1425    Functional Assessment Tool Used (Outpatient Only) Clinical judgement based on assessment of ROM, strength and UEFS.   Functional Limitation Carrying, moving and handling objects   Carrying, Moving and Handling Objects Current Status [phone]) At least 40 percent but less than 60 percent impaired, limited or restricted   Carrying, Moving and Handling Objects Goal Status (U0454) At least 20 percent but less than 40 percent impaired, limited or restricted       Problem List Patient Active Problem List   Diagnosis Date Noted  . Perennial allergic rhinitis 10/01/2016  . Chronic urticaria 10/01/2016  . Stiffness of joints, not elsewhere classified, multiple sites 06/07/2013  . Cervical pain (neck) 06/07/2013  . Spasm of muscle 06/07/2013    3:39 PM,2016-10-20 Marylyn Ishihara PT, DPT [facility] [phone]  [facility] [address] [place], [place], [address] Phone: [phone]   Fax:  [phone]  Name: Cayman Kielbasa MRN: [id number] Date of Birth: 06/11/1950

## 2016-10-15 ENCOUNTER — Ambulatory Visit (HOSPITAL_COMMUNITY): Payer: Non-veteran care | Admitting: Physical Therapy

## 2016-10-15 DIAGNOSIS — M25511 Pain in right shoulder: Secondary | ICD-10-CM

## 2016-10-15 DIAGNOSIS — M6281 Muscle weakness (generalized): Secondary | ICD-10-CM

## 2016-10-15 DIAGNOSIS — M25512 Pain in left shoulder: Secondary | ICD-10-CM

## 2016-10-15 DIAGNOSIS — M25612 Stiffness of left shoulder, not elsewhere classified: Secondary | ICD-10-CM

## 2016-10-15 DIAGNOSIS — R29898 Other symptoms and signs involving the musculoskeletal system: Secondary | ICD-10-CM

## 2016-10-15 NOTE — Therapy (Signed)
[facility] [address] [place], [place], [address] Phone: [phone]   Fax:  [phone]  Physical Therapy Treatment  Patient Details  Name: Randy [name] MRN: [id number] Date of Birth: 01/31/1951 Referring Provider: Steward Ros, MD   Encounter Date: 10/15/2016      PT End of Session - 10/15/16 0943    Visit Number 2   Number of Visits 13   Date for PT Re-Evaluation 11/01/16   Authorization Type VA   Authorization Time Period 10/11/16 to 11/22/16   PT Start Time 0902   PT Stop Time 0941   PT Time Calculation (min) 39 min   Activity Tolerance Patient tolerated treatment well   Behavior During Therapy [facility] for tasks assessed/performed      No past medical history on file.  Past Surgical History:  Procedure Laterality Date  . NECK SURGERY  2015  . TONSILLECTOMY      There were no vitals filed for this visit.      Subjective Assessment - 10/15/16 0905    Subjective Patient arrives stating he has been working on HEP, no major changes since eval   Pertinent History neck surgery 2015   Patient Stated Goals improve strength    Currently in Pain? Yes   Pain Score 3    Pain Location Shoulder   Pain Orientation Left   Pain Descriptors / Indicators Sharp   Pain Type Chronic pain   Pain Radiating Towards none    Pain Onset More than a month ago   Pain Frequency Intermittent   Aggravating Factors  certain movements like reaching up or putting on shirt    Pain Relieving Factors stop doing aggravating motion   Effect of Pain on Daily Activities moderate impact, high dependence on R UE             [facility] PT Assessment - 10/15/16 0001      AROM   Thoracic Flexion moderate limitaion   Thoracic Extension severe limitation    Thoracic - Right Side Bend [facility]    Thoracic - Left Side Bend [facility]    Thoracic - Right Rotation moderate limitation    Thoracic - Left Rotation moderate limitation                       [facility] Adult PT Treatment/Exercise - 10/15/16 0001      Shoulder Exercises: Seated   Retraction Both;15 reps   Other Seated Exercises seated throacic excursions 1x15    Other Seated Exercises shoulder rolls- up, down back- x15      Shoulder Exercises: Prone   Other Prone Exercises [place] 6 with min assist all directions 1x10      Shoulder Exercises: Standing   Extension Both;15 reps   Theraband Level (Shoulder Extension) Level 2 (Red)   Row Limitations attempted, painful so stopped exercise    Retraction Both;15 reps   Theraband Level (Shoulder Retraction) Level 2 (Red)     Shoulder Exercises: Stretch   Corner Stretch 3 reps;30 seconds     Manual Therapy   Manual Therapy Passive ROM;Joint mobilization   Manual therapy comments performed separately from all other skilled services    Joint Mobilization shoulder depression grade III x3 rounds    Passive ROM shoulder flexion/ABD in scap plane, education provided during                 PT Education - 10/15/16 0942    Education provided Yes  Education Details throacic excurision HEP, ok to reschedule appointments as long as he lets [place] know ahead of time insetead of no-shownig; benefits of shoulder mobilizations/ROM work    [employer]) Educated Patient   Methods Explanation;Handout;Demonstration   Comprehension Verbalized understanding;Need further instruction;Returned demonstration          PT Short Term Goals - 10/11/16 1419      PT SHORT TERM GOAL #1   Title Pt will demonstrate consistency and independence with his HEP to decrease pain and improve shoulder ROM.    Time 3   Period Weeks   Status New     PT SHORT TERM GOAL #2   Title Pt will demo improved postural awareness, evident by his ability to self-correct posture during a session, with therapist cuing no more than 50% of the time.    Time 3   Period Weeks   Status New     PT SHORT TERM GOAL #3   Title Pt will demo improved Lt shoulder AROM to WNL of  the RLE, to improve his ability to reach overhead during tasks.    Time 3   Period Weeks   Status New           PT Long Term Goals - 10/11/16 1420      PT LONG TERM GOAL #1   Title Pt will demo improved Lt shoulder strength to 5/5 MMT, which will increase his ability to complete daily household activities such as vacuuming and sweeping.    Time 6   Period Weeks   Status New     PT LONG TERM GOAL #2   Title Pt will demo atleast a 9 point improvement on the UEFS, to indicate a significant improvement in functional strength of the UEs.   Time 6   Period Weeks   Status New     PT LONG TERM GOAL #3   Title Pt will demo improved Lt grip strength to within 5lb of the RLE, to improve his ability to open a jar at home.    Time 6   Period Weeks   Status New               Plan - 10/15/16 4098    Clinical Impression Statement Began session by reviewing initial eval/goals, then proceeded to perform shoulder ROM for flexion and ABD as well as grade III glenohumeral head depression mobilizations. Assessed and worked with thoracic mobility per POC and also introduced [place] 6 exercises for improve shoulder and scapular control. Assigned thoracic excursions as addition to HEP.    Rehab Potential Good   Clinical Impairments Affecting Rehab Potential (+) age and motivation to improve    PT Frequency 2x / week   PT Duration 6 weeks   PT Treatment/Interventions ADLs/Self Care Home Management;Cryotherapy;Moist Heat;Therapeutic exercise;Therapeutic activities;Neuromuscular re-education;Patient/family education;Manual techniques;Dry needling;Passive range of motion   PT Next Visit Plan continue work on scapular control, shoulder strength and ROM. Contine to work on thoracic ROM. Shoulder strength progression.    PT Home Exercise Plan scap retraction x15 reps; doorway pec stretch 3x30 sec; thoracic 3D excursions    Consulted and Agree with Plan of Care Patient      Patient will benefit  from skilled therapeutic intervention in order to improve the following deficits and impairments:  Decreased range of motion, Decreased strength, Hypomobility, Impaired flexibility, Postural dysfunction, Increased muscle spasms, Improper body mechanics, Pain, Impaired perceived functional ability  Visit Diagnosis: Left shoulder pain, unspecified chronicity  Right shoulder  pain, unspecified chronicity  Muscle weakness (generalized)  Other symptoms and signs involving the musculoskeletal system  Stiffness of left shoulder, not elsewhere classified     Problem List Patient Active Problem List   Diagnosis Date Noted  . Perennial allergic rhinitis 10/01/2016  . Chronic urticaria 10/01/2016  . Stiffness of joints, not elsewhere classified, multiple sites 06/07/2013  . Cervical pain (neck) 06/07/2013  . Spasm of muscle 06/07/2013    Nedra HaiKristen Timberlynn Kizziah PT, DPT [phone]  [facility] [address] [place], [place], [address] Phone: [phone]   Fax:  [phone]  Name: Randy MarkerJohn [name] MRN: [id number] Date of Birth: November 21, 1950

## 2016-10-16 ENCOUNTER — Ambulatory Visit (HOSPITAL_COMMUNITY): Payer: Non-veteran care

## 2016-10-16 DIAGNOSIS — M6281 Muscle weakness (generalized): Secondary | ICD-10-CM

## 2016-10-16 DIAGNOSIS — M25511 Pain in right shoulder: Secondary | ICD-10-CM

## 2016-10-16 DIAGNOSIS — M25512 Pain in left shoulder: Secondary | ICD-10-CM

## 2016-10-16 DIAGNOSIS — M25612 Stiffness of left shoulder, not elsewhere classified: Secondary | ICD-10-CM

## 2016-10-16 DIAGNOSIS — R29898 Other symptoms and signs involving the musculoskeletal system: Secondary | ICD-10-CM

## 2016-10-16 LAB — CP CHRONIC URTICARIA INDEX PANEL
Histamine Release: <16 % (ref <16)
TSH: 0.73 m[IU]/L (ref 0.40–4.50)

## 2016-10-16 NOTE — Therapy (Signed)
[facility] [address] [place], [place], [address] Phone: [phone]   Fax:  [phone]  Physical Therapy Treatment  Patient Details  Name: Randy [name] MRN: [id number] Date of Birth: June 08, 1950 Referring Provider: Steward Ros, MD   Encounter Date: 10/16/2016      PT End of Session - 10/16/16 1745    Visit Number 3   Number of Visits 13   Date for PT Re-Evaluation 11/01/16   Authorization Type VA   Authorization Time Period 10/11/16 to 11/22/16   PT Start Time 1741   PT Stop Time 1824   PT Time Calculation (min) 43 min   Activity Tolerance Patient tolerated treatment well;No increased pain   Behavior During Therapy [facility] for tasks assessed/performed      No past medical history on file.  Past Surgical History:  Procedure Laterality Date  . NECK SURGERY  2015  . TONSILLECTOMY      There were no vitals filed for this visit.      Subjective Assessment - 10/16/16 1734    Subjective Pt stated shoulder is feeling better today, reports complaince iwht HEP.  Reports shoulder pain can increase to 3/10 with certain movements   Pertinent History neck surgery 2015   Patient Stated Goals improve strength    Currently in Pain? Yes   Pain Location Shoulder   Pain Orientation Left   Pain Descriptors / Indicators Sharp   Pain Type Chronic pain   Pain Onset More than a month ago   Pain Frequency Intermittent   Aggravating Factors  certain movements like reaching up or putting on shirt   Pain Relieving Factors stop doing aggravating motion   Effect of Pain on Daily Activities moderate impact, high dependence on Rt UE                         [facility] Adult PT Treatment/Exercise - 10/16/16 0001      Shoulder Exercises: Seated   Retraction Both;15 reps   Other Seated Exercises 3D thoracic excursion with arms crossed on chest 10x each; Thoracic extension with towel at T8 5x 10"   Other Seated Exercises Educated on  importance of posture, lumbar roll; cervical and scapular retraction 15x; Wback 15x     Shoulder Exercises: Standing   Extension Both;15 reps;Theraband   Theraband Level (Shoulder Extension) Level 2 (Red)   Extension Limitations verbal/tactile cueing for form/tech   Row 10 reps;Theraband   Theraband Level (Shoulder Row) Level 2 (Red)   Row Limitations verbal/tactile cueing for form/tech   Retraction 10 reps;Both;Theraband   Theraband Level (Shoulder Retraction) Level 2 (Red)   Retraction Limitations verbal/tactile cueing for form/tech     Shoulder Exercises: Stretch   Corner Stretch 3 reps;30 seconds     Manual Therapy   Manual Therapy Passive ROM;Myofascial release   Manual therapy comments performed separately from all other skilled services    Myofascial Release anterior shoulder   Passive ROM shoulder flexion/ABD in scap plane, education provided during                 PT Education - 10/16/16 1831    Education provided Yes   Education Details Reviewed form wiht thoracic excursion.  Educated on importance of posture and lumbar roll for assistance   Person(s) Educated Patient   Methods Explanation;Tactile cues;Verbal cues   Comprehension Verbalized understanding;Returned demonstration;Verbal cues required;Tactile cues required;Need further instruction  PT Short Term Goals - 10/11/16 1419      PT SHORT TERM GOAL #1   Title Pt will demonstrate consistency and independence with his HEP to decrease pain and improve shoulder ROM.    Time 3   Period Weeks   Status New     PT SHORT TERM GOAL #2   Title Pt will demo improved postural awareness, evident by his ability to self-correct posture during a session, with therapist cuing no more than 50% of the time.    Time 3   Period Weeks   Status New     PT SHORT TERM GOAL #3   Title Pt will demo improved Lt shoulder AROM to WNL of the RLE, to improve his ability to reach overhead during tasks.    Time 3    Period Weeks   Status New           PT Long Term Goals - 10/11/16 1420      PT LONG TERM GOAL #1   Title Pt will demo improved Lt shoulder strength to 5/5 MMT, which will increase his ability to complete daily household activities such as vacuuming and sweeping.    Time 6   Period Weeks   Status New     PT LONG TERM GOAL #2   Title Pt will demo atleast a 9 point improvement on the UEFS, to indicate a significant improvement in functional strength of the UEs.   Time 6   Period Weeks   Status New     PT LONG TERM GOAL #3   Title Pt will demo improved Lt grip strength to within 5lb of the RLE, to improve his ability to open a jar at home.    Time 6   Period Weeks   Status New               Plan - 10/16/16 1826    Clinical Impression Statement Session focus on improving thoracic mobility and scapular musculature strengtheing.  Pt educated on importance of posture to improve shoulder mobiltiy and reduce pain, cueing through session to reduce forward head and rolled shoulders.  Educated benefits with towel lumbar roll to improve awareness of pain.  Noted increased tenderness anterior shoulder with palpation.  No reoprts of increased pain through session.     Rehab Potential Good   Clinical Impairments Affecting Rehab Potential (+) age and motivation to improve    PT Frequency 2x / week   PT Duration 6 weeks   PT Treatment/Interventions ADLs/Self Care Home Management;Cryotherapy;Moist Heat;Therapeutic exercise;Therapeutic activities;Neuromuscular re-education;Patient/family education;Manual techniques;Dry needling;Passive range of motion   PT Next Visit Plan Continue thoracic mobilty and posture education/strengthening to improve scapular control, shoulder strengthening and ROM.  Progress shoulder strength as able.     PT Home Exercise Plan scap retraction x15 reps; doorway pec stretch 3x30 sec; thoracic 3D excursions       Patient will benefit from skilled therapeutic  intervention in order to improve the following deficits and impairments:  Decreased range of motion, Decreased strength, Hypomobility, Impaired flexibility, Postural dysfunction, Increased muscle spasms, Improper body mechanics, Pain, Impaired perceived functional ability  Visit Diagnosis: Left shoulder pain, unspecified chronicity  Right shoulder pain, unspecified chronicity  Muscle weakness (generalized)  Other symptoms and signs involving the musculoskeletal system  Stiffness of left shoulder, not elsewhere classified     Problem List Patient Active Problem List   Diagnosis Date Noted  . Perennial allergic rhinitis 10/01/2016  . Chronic urticaria 10/01/2016  .  Stiffness of joints, not elsewhere classified, multiple sites 06/07/2013  . Cervical pain (neck) 06/07/2013  . Spasm of muscle 06/07/2013   Becky Sax, LPTA; CBIS [phone]  Juel Burrow 10/16/2016, 6:34 PM  [facility] [address] [place], [place], [address] Phone: [phone]   Fax:  [phone]  Name: Randy [name] MRN: [id number] Date of Birth: [birth year]

## 2016-10-22 ENCOUNTER — Ambulatory Visit (HOSPITAL_COMMUNITY): Payer: Non-veteran care

## 2016-10-22 DIAGNOSIS — R29898 Other symptoms and signs involving the musculoskeletal system: Secondary | ICD-10-CM

## 2016-10-22 DIAGNOSIS — R2681 Unsteadiness on feet: Secondary | ICD-10-CM

## 2016-10-22 DIAGNOSIS — M25512 Pain in left shoulder: Secondary | ICD-10-CM

## 2016-10-22 DIAGNOSIS — M25511 Pain in right shoulder: Secondary | ICD-10-CM

## 2016-10-22 DIAGNOSIS — M25612 Stiffness of left shoulder, not elsewhere classified: Secondary | ICD-10-CM

## 2016-10-22 DIAGNOSIS — M6281 Muscle weakness (generalized): Secondary | ICD-10-CM

## 2016-10-22 NOTE — Therapy (Signed)
[facility] [address] [place], [place], [address] Phone: [phone]   Fax:  [phone]  Physical Therapy Treatment  Patient Details  Name: Randy [name] MRN: [id number] Date of Birth: [birth year] Referring Provider: Steward Ros, MD   Encounter Date: 10/22/2016      PT End of Session - 10/22/16 0916    Visit Number 4   Number of Visits 13   Date for PT Re-Evaluation 11/01/16   Authorization Type VA   Authorization Time Period 10/11/16 to 11/22/16   PT Start Time 0905   PT Stop Time 0944   PT Time Calculation (min) 39 min   Activity Tolerance Patient tolerated treatment well;No increased pain   Behavior During Therapy [facility] for tasks assessed/performed      No past medical history on file.  Past Surgical History:  Procedure Laterality Date  . NECK SURGERY  2015  . TONSILLECTOMY      There were no vitals filed for this visit.      Subjective Assessment - 10/22/16 0907    Subjective Pt stated shoulder is feeling better today, has been compliant with some of HEP throughout the day.  Current pain scale 2/10 with certain movements   Pertinent History neck surgery 2015   Patient Stated Goals improve strength    Currently in Pain? Yes   Pain Score 2    Pain Location Shoulder   Pain Orientation Left   Pain Descriptors / Indicators Sharp  intermittent sharp pain wiht certain movements   Pain Type Chronic pain   Pain Onset More than a month ago   Pain Frequency Intermittent   Aggravating Factors  certain movements like reaching up or putting on shirt   Pain Relieving Factors stop doing aggravating motion   Effect of Pain on Daily Activities moderate impact, high dependence of Rt UE                         [facility] Adult PT Treatment/Exercise - 10/22/16 0001      Shoulder Exercises: Seated   Retraction Both;15 reps   Retraction Limitations cervical and scapula retraction   Other Seated Exercises 3D thoracic  excursion with arms crossed on chest 10x each; Thoracic extension with towel at T8 5x 10"   Other Seated Exercises posterior rolls up, back and down), wback x 15     Shoulder Exercises: Standing   Extension Both;15 reps;Theraband   Theraband Level (Shoulder Extension) Level 2 (Red)   Row 15 reps;Theraband   Theraband Level (Shoulder Row) Level 2 (Red)   Retraction 10 reps;Both;Theraband   Theraband Level (Shoulder Retraction) Level 2 (Red)   Retraction Limitations verbal/tactile cueing for form/tech   Other Standing Exercises wall walk with lift off 5x   Other Standing Exercises back against wall with shoulder flexion 10x 3     Shoulder Exercises: Stretch   Corner Stretch 3 reps;30 seconds     Manual Therapy   Manual Therapy Passive ROM;Myofascial release   Manual therapy comments performed separately from all other skilled services    Myofascial Release anterior shoulder   Passive ROM shoulder flexion/ABD in scap plane, education provided during                   PT Short Term Goals - 10/11/16 1419      PT SHORT TERM GOAL #1   Title Pt will demonstrate consistency and independence with his HEP to decrease pain and  improve shoulder ROM.    Time 3   Period Weeks   Status New     PT SHORT TERM GOAL #2   Title Pt will demo improved postural awareness, evident by his ability to self-correct posture during a session, with therapist cuing no more than 50% of the time.    Time 3   Period Weeks   Status New     PT SHORT TERM GOAL #3   Title Pt will demo improved Lt shoulder AROM to WNL of the RLE, to improve his ability to reach overhead during tasks.    Time 3   Period Weeks   Status New           PT Long Term Goals - 10/11/16 1420      PT LONG TERM GOAL #1   Title Pt will demo improved Lt shoulder strength to 5/5 MMT, which will increase his ability to complete daily household activities such as vacuuming and sweeping.    Time 6   Period Weeks   Status New      PT LONG TERM GOAL #2   Title Pt will demo atleast a 9 point improvement on the UEFS, to indicate a significant improvement in functional strength of the UEs.   Time 6   Period Weeks   Status New     PT LONG TERM GOAL #3   Title Pt will demo improved Lt grip strength to within 5lb of the RLE, to improve his ability to open a jar at home.    Time 6   Period Weeks   Status New               Plan - 10/22/16 1255    Clinical Impression Statement Pt stated he is making good progress with pain less intensity and frequency.  Continued session focus with shoulder mobility and posture/scapular strengthening.  Added wall walks lift off and shoulder flexion therex infront of wall for posture awarness and strengthening.  Less cueing required for form wtih theraband exercises this session.  Noted increased tenderness wiht palpation anterior shoulder, reoprts of relief following manual at [facility].     Rehab Potential Good   Clinical Impairments Affecting Rehab Potential (+) age and motivation to improve    PT Frequency 2x / week   PT Duration 6 weeks   PT Treatment/Interventions ADLs/Self Care Home Management;Cryotherapy;Moist Heat;Therapeutic exercise;Therapeutic activities;Neuromuscular re-education;Patient/family education;Manual techniques;Dry needling;Passive range of motion   PT Next Visit Plan Resume prone exercises for scapular mobility and continue thoracic mobilty and posture education/strengthening to improve scapular control, shoulder strengthening and ROM.  Progress shoulder strength as able.     PT Home Exercise Plan scap retraction x15 reps; doorway pec stretch 3x30 sec; thoracic 3D excursions       Patient will benefit from skilled therapeutic intervention in order to improve the following deficits and impairments:  Decreased range of motion, Decreased strength, Hypomobility, Impaired flexibility, Postural dysfunction, Increased muscle spasms, Improper body mechanics, Pain,  Impaired perceived functional ability  Visit Diagnosis: Left shoulder pain, unspecified chronicity  Right shoulder pain, unspecified chronicity  Muscle weakness (generalized)  Other symptoms and signs involving the musculoskeletal system  Stiffness of left shoulder, not elsewhere classified  Unsteadiness on feet     Problem List Patient Active Problem List   Diagnosis Date Noted  . Perennial allergic rhinitis 10/01/2016  . Chronic urticaria 10/01/2016  . Stiffness of joints, not elsewhere classified, multiple sites 06/07/2013  . Cervical pain (neck) 06/07/2013  .  Spasm of muscle 06/07/2013   Becky Sax, LPTA; CBIS [phone]  Juel Burrow 10/22/2016, 12:59 PM  [facility] [address] [place], [place], [address] Phone: [phone]   Fax:  [phone]  Name: Randy [name] MRN: [id number] Date of Birth: January 05, 1951

## 2016-10-25 ENCOUNTER — Ambulatory Visit (HOSPITAL_COMMUNITY): Payer: Non-veteran care | Admitting: Physical Therapy

## 2016-10-25 DIAGNOSIS — M25612 Stiffness of left shoulder, not elsewhere classified: Secondary | ICD-10-CM

## 2016-10-25 DIAGNOSIS — M25512 Pain in left shoulder: Secondary | ICD-10-CM

## 2016-10-25 DIAGNOSIS — M25511 Pain in right shoulder: Secondary | ICD-10-CM

## 2016-10-25 DIAGNOSIS — M6281 Muscle weakness (generalized): Secondary | ICD-10-CM

## 2016-10-25 DIAGNOSIS — R29898 Other symptoms and signs involving the musculoskeletal system: Secondary | ICD-10-CM

## 2016-10-25 NOTE — Therapy (Signed)
[facility] [facility] [address] [place], [place], [address] Phone: [phone]   Fax:  [phone]  Physical Therapy Treatment  Patient Details  Name: Randy MarkerJohn [name] MRN: [id number] Date of Birth: 07-15-1950 Referring Provider: Steward RosPeggy Cassade, MD   Encounter Date: 10/25/2016      PT End of Session - 10/25/16 1204    Visit Number 5   Number of Visits 13   Date for PT Re-Evaluation 11/01/16   Authorization Type [facility]   Authorization Time Period 10/11/16 to 11/22/16   PT Start Time 0945   PT Stop Time 1029   PT Time Calculation (min) 44 min   Activity Tolerance Patient tolerated treatment well;No increased pain   Behavior [facility] Therapy [facility] for tasks assessed/performed      No past medical history on file.  Past Surgical History:  Procedure Laterality Date  . NECK SURGERY  2015  . TONSILLECTOMY      There were no vitals filed for this visit.      Subjective Assessment - 10/25/16 0946    Subjective Pt reports that things are going ok today. He has been using the warm water from the shower to improve his pain at times.    Pertinent History neck surgery 2015   Patient Stated Goals improve strength    Currently in Pain? No/denies   Pain Onset More than a month ago                         [facility] - 10/25/16 0001      Shoulder Exercises: Supine   Other Supine Exercises serratus punches with horizontal abduction, using red TB 2x10 reps each      Shoulder Exercises: Seated   Other Seated Exercises UBE prior to start of session 2" forward/2" backward    Other Seated Exercises shoulder ER with red TB 2x10 reps      Shoulder Exercises: Standing   Other Standing Exercises plank, banded wall clock with yellow TB x10 reps each    Other Standing Exercises standing snow angels 2x5 reps with red TB      Shoulder Exercises: ROM/Strengthening   Modified Plank Limitations straight arm plank with UE reach  in various directions x10 reps each     Shoulder Exercises: Stretch   Corner Stretch 2 reps;30 seconds   Corner Stretch Limitations in doorway, single UE at a time                 PT Education - 10/25/16 1202    Education provided Yes   Education Details importance of full HEP adherence; technique with therex   Person(s) Educated Patient   Methods Explanation;Tactile cues;Handout   Comprehension Verbalized understanding;Returned demonstration          PT Short Term Goals - 10/11/16 1419      PT SHORT TERM GOAL #1   Title Pt will demonstrate consistency and independence with his HEP to decrease pain and improve shoulder ROM.    Time 3   Period Weeks   Status New     PT SHORT TERM GOAL #2   Title Pt will demo improved postural awareness, evident by his ability to self-correct posture during a session, with therapist cuing no more than 50% of the time.    Time 3   Period Weeks   Status New     PT SHORT TERM GOAL #3   Title Pt will demo improved  Lt shoulder AROM to WNL of the RLE, to improve his ability to reach overhead during tasks.    Time 3   Period Weeks   Status New           PT Long Term Goals - 10/11/16 1420      PT LONG TERM GOAL #1   Title Pt will demo improved Lt shoulder strength to 5/5 MMT, which will increase his ability to complete daily household activities such as vacuuming and sweeping.    Time 6   Period Weeks   Status New     PT LONG TERM GOAL #2   Title Pt will demo atleast a 9 point improvement on the UEFS, to indicate a significant improvement in functional strength of the UEs.   Time 6   Period Weeks   Status New     PT LONG TERM GOAL #3   Title Pt will demo improved Lt grip strength to within 5lb of the RLE, to improve his ability to open a jar at home.    Time 6   Period Weeks   Status New               Plan - 10/25/16 1205    Clinical Impression Statement Pt continues to demonstrate BUE weakness, specifically  throughout the posterior shoulder. Focused on exercise to improve his shoulder strength and endurance. Pt was able to complete exercises with increased verbal/tactile cues to improve his technique, specifically during shoulder external rotator strengthening. Pt's HEP was updated to address noted areas of weakness and he demonstrated full understanding. Fatigue was reported by the end of today's session.    Rehab Potential Good   Clinical Impairments Affecting Rehab Potential (+) age and motivation to improve    PT Frequency 2x / week   PT Duration 6 weeks   PT Treatment/Interventions ADLs/Self Care Home Management;Cryotherapy;Moist Heat;Therapeutic exercise;Therapeutic activities;Neuromuscular re-education;Patient/family education;Manual techniques;Dry needling;Passive range of motion   PT Next Visit Plan thoracic mobility, focus on shoulder ER strength in sitting/standing positions, serratus pushups in incline position   PT Home Exercise Plan seated shoulder ER with red TB, standing tricep extension with red TB   Consulted and Agree with Plan of Care Patient      Patient will benefit from skilled therapeutic intervention in order to improve the following deficits and impairments:  Decreased range of motion, Decreased strength, Hypomobility, Impaired flexibility, Postural dysfunction, Increased muscle spasms, Improper body mechanics, Pain, Impaired perceived functional ability  Visit Diagnosis: Left shoulder pain, unspecified chronicity  Right shoulder pain, unspecified chronicity  Muscle weakness (generalized)  Other symptoms and signs involving the musculoskeletal system  Stiffness of left shoulder, not elsewhere classified     Problem List Patient Active Problem List   Diagnosis Date Noted  . Perennial allergic rhinitis 10/01/2016  . Chronic urticaria 10/01/2016  . Stiffness of joints, not elsewhere classified, multiple sites 06/07/2013  . Cervical pain (neck) 06/07/2013  .  Spasm of muscle 06/07/2013     12:13 PM,10/25/16 Marylyn Ishihara PT, DPT [facility] [phone]  [facility] [address] [place], [place], [address] Phone: [phone]   Fax:  [phone]  Name: Joby Richart MRN: [id number] Date of Birth: [birth year]

## 2016-10-29 ENCOUNTER — Ambulatory Visit (HOSPITAL_COMMUNITY): Payer: Non-veteran care | Admitting: Physical Therapy

## 2016-10-29 DIAGNOSIS — M25612 Stiffness of left shoulder, not elsewhere classified: Secondary | ICD-10-CM

## 2016-10-29 DIAGNOSIS — M6281 Muscle weakness (generalized): Secondary | ICD-10-CM

## 2016-10-29 DIAGNOSIS — M25512 Pain in left shoulder: Secondary | ICD-10-CM

## 2016-10-29 DIAGNOSIS — R29898 Other symptoms and signs involving the musculoskeletal system: Secondary | ICD-10-CM

## 2016-10-29 DIAGNOSIS — M25511 Pain in right shoulder: Secondary | ICD-10-CM

## 2016-10-29 NOTE — Therapy (Signed)
[facility] [address] [place], [place], [address] Phone: [phone]   Fax:  [phone]  Physical Therapy Treatment  Patient Details  Name: Randy [name] MRN: [id number] Date of Birth: [birth year] Referring Provider: Steward Ros, MD   Encounter Date: 10/29/2016      PT End of Session - 10/29/16 1034    Visit Number 5   Number of Visits 13   Date for PT Re-Evaluation 11/01/16   Authorization Type [facility]   Authorization Time Period 10/11/16 to 11/22/16   PT Start Time 0902   PT Stop Time 0945   PT Time Calculation (min) 43 min   Activity Tolerance Patient tolerated treatment well;No increased pain   Behavior During Therapy [facility] for tasks assessed/performed      No past medical history on file.  Past Surgical History:  Procedure Laterality Date  . NECK SURGERY  2015  . TONSILLECTOMY      There were no vitals filed for this visit.      Subjective Assessment - 10/29/16 0907    Subjective Pt reports that he wasn't too sore after his last session. He got up this morning and worked on some of his exercises.    Pertinent History neck surgery 2015   Patient Stated Goals improve strength    Currently in Pain? No/denies   Pain Onset More than a month ago                         [facility] Adult PT Treatment/Exercise - 10/29/16 0001      Shoulder Exercises: Supine   Other Supine Exercises supine snow angels x10 reps      Shoulder Exercises: Seated   Other Seated Exercises UBE prior to start of session 2" forward/2" backward      Shoulder Exercises: Sidelying   External Rotation Strengthening;Left;10 reps  1 set of each on Rt    External Rotation Limitations x2 sets, 1st set with 3#, 2nd set with 1#; alternating with shoulder ER hold during elevation x10 reps each      Shoulder Exercises: Standing   Other Standing Exercises Plank UE walks Lt/Rt along elevated mat table, elbows straight with red TB around wrist x4  RT with rest breaks in between; plank hold with contralateral UE reach varied directions per therapist discretion.    Other Standing Exercises tricep extension with green TB 2x15 reps, therapist providing cues for full elbow extension      Manual Therapy   Manual Therapy Muscle Energy Technique   Manual therapy comments performed separately from all other skilled services    Muscle Energy Technique B pec minor contract, relax, stretch x6 reps                 PT Education - 10/29/16 1034    Education provided Yes   Education Details technique with therex; briefly discussed dry needling treatment and benefits    Person(s) Educated Patient   Methods Explanation;Verbal cues   Comprehension Verbalized understanding;Returned demonstration          PT Short Term Goals - 10/11/16 1419      PT SHORT TERM GOAL #1   Title Pt will demonstrate consistency and independence with his HEP to decrease pain and improve shoulder ROM.    Time 3   Period Weeks   Status New     PT SHORT TERM GOAL #2   Title Pt will demo improved postural awareness, evident  by his ability to self-correct posture during a session, with therapist cuing no more than 50% of the time.    Time 3   Period Weeks   Status New     PT SHORT TERM GOAL #3   Title Pt will demo improved Lt shoulder AROM to WNL of the RLE, to improve his ability to reach overhead during tasks.    Time 3   Period Weeks   Status New           PT Long Term Goals - 10/11/16 1420      PT LONG TERM GOAL #1   Title Pt will demo improved Lt shoulder strength to 5/5 MMT, which will increase his ability to complete daily household activities such as vacuuming and sweeping.    Time 6   Period Weeks   Status New     PT LONG TERM GOAL #2   Title Pt will demo atleast a 9 point improvement on the UEFS, to indicate a significant improvement in functional strength of the UEs.   Time 6   Period Weeks   Status New     PT LONG TERM GOAL #3    Title Pt will demo improved Lt grip strength to within 5lb of the RLE, to improve his ability to open a jar at home.    Time 6   Period Weeks   Status New               Plan - 10/29/16 1127    Clinical Impression Statement Continued this session with therex to improve pt's shoulder strength and mobility. Pt was able to complete exercises with heavy cues for technique. Noted fatigue again this session, however there was no report of increased pain by the end of today's session. Pt would benefit from dry needling treatment to address secondary and primary trigger points throughout the shoulder, and therapist discussed this with him. Pt verbalized agreement with this.    Rehab Potential Good   Clinical Impairments Affecting Rehab Potential (+) age and motivation to improve    PT Frequency 2x / week   PT Duration 6 weeks   PT Treatment/Interventions ADLs/Self Care Home Management;Cryotherapy;Moist Heat;Therapeutic exercise;Therapeutic activities;Neuromuscular re-education;Patient/family education;Manual techniques;Dry needling;Passive range of motion   PT Next Visit Plan dry needling (lats/pec major); thoracic mobility next session, focus on shoulder ER strength in sitting/standing positions, serratus pushups in incline position   PT Home Exercise Plan seated shoulder ER with red TB, standing tricep extension with red TB   Consulted and Agree with Plan of Care Patient      Patient will benefit from skilled therapeutic intervention in order to improve the following deficits and impairments:  Decreased range of motion, Decreased strength, Hypomobility, Impaired flexibility, Postural dysfunction, Increased muscle spasms, Improper body mechanics, Pain, Impaired perceived functional ability  Visit Diagnosis: Left shoulder pain, unspecified chronicity  Right shoulder pain, unspecified chronicity  Muscle weakness (generalized)  Other symptoms and signs involving the musculoskeletal  system  Stiffness of left shoulder, not elsewhere classified     Problem List Patient Active Problem List   Diagnosis Date Noted  . Perennial allergic rhinitis 10/01/2016  . Chronic urticaria 10/01/2016  . Stiffness of joints, not elsewhere classified, multiple sites 06/07/2013  . Cervical pain (neck) 06/07/2013  . Spasm of muscle 06/07/2013    12:09 PM,10/29/16 Marylyn IshiharaSara Kiser PT, DPT [facility] [phone]  [facility] [address] [place], [place], [address]  Phone: [phone]   Fax:  [phone]  Name: Randy [name] MRN: [id number] Date of Birth: [birth year]

## 2016-11-01 ENCOUNTER — Ambulatory Visit (HOSPITAL_COMMUNITY): Payer: Non-veteran care | Admitting: Physical Therapy

## 2016-11-01 DIAGNOSIS — M25511 Pain in right shoulder: Secondary | ICD-10-CM

## 2016-11-01 DIAGNOSIS — M25512 Pain in left shoulder: Secondary | ICD-10-CM

## 2016-11-01 DIAGNOSIS — R29898 Other symptoms and signs involving the musculoskeletal system: Secondary | ICD-10-CM

## 2016-11-01 DIAGNOSIS — M25612 Stiffness of left shoulder, not elsewhere classified: Secondary | ICD-10-CM

## 2016-11-01 DIAGNOSIS — M6281 Muscle weakness (generalized): Secondary | ICD-10-CM

## 2016-11-01 NOTE — Patient Instructions (Signed)

## 2016-11-01 NOTE — Therapy (Signed)
[facility] [facility] [address] [place], [place], [address] Phone: [phone]   Fax:  [phone]  Physical Therapy Treatment  Patient Details  Name: Randy [name] MRN: [id number] Date of Birth: 1950/12/20 Referring Provider: Steward RosPeggy Cassade, MD   Encounter Date: 11/01/2016      PT End of Session - 11/01/16 0933    Visit Number 6   Number of Visits 13   Date for PT Re-Evaluation 11/01/16   Authorization Type [facility]   Authorization Time Period 10/11/16 to 11/22/16   PT Start Time 0905   PT Stop Time 0945   PT Time Calculation (min) 40 min   Activity Tolerance Patient tolerated treatment well;No increased pain   Behavior During Therapy [facility] for tasks assessed/performed      No past medical history on file.  Past Surgical History:  Procedure Laterality Date  . NECK SURGERY  2015  . TONSILLECTOMY      There were no vitals filed for this visit.      Subjective Assessment - 11/01/16 0908    Subjective Pt reports that he thinks his arm is improving. He is not sore currently and states he has been trying to complete his exercises at home.    Pertinent History neck surgery 2015   Patient Stated Goals improve strength    Pain Onset More than a month ago                 UBE L2 x2 min forward, x2 min backward prior to start of session        [facility] Adult PT Treatment/Exercise - 11/01/16 0001      Shoulder Exercises: Supine   Other Supine Exercises shoulder elevation with Abd hold with red TB around wrist, x10 reps      Shoulder Exercises: Seated   Other Seated Exercises seated chin tucks x15 reps      Shoulder Exercises: Standing   Other Standing Exercises BUE ER slide on wall with lift off hold x5", 2x5 reps      Shoulder Exercises: Stretch   Cross Chest Stretch 5 reps;20 seconds     Manual Therapy   Manual Therapy --   Manual therapy comments performed separately from all other skilled services    Soft tissue  mobilization Lt latissimus and pec major intermittently during dry needling treatment    Myofascial Release Lt upper trap with shoulder ER hold 8x5"           Trigger Point Dry Needling - 11/01/16 0938    Consent Given? Yes   Education Handout Provided Yes   Muscles Treated Upper Body Pectoralis major  latissimus    Pectoralis Major Response Palpable increased muscle length;Twitch response elicited  also noted in Lt lattisimus (proximal)              PT Education - 11/01/16 0929    Education provided Yes   Education Details implications/technique/side effects of dry needling treatment; technique with therex    Person(s) Educated Patient   Methods Explanation;Tactile cues;Verbal cues;Handout   Comprehension Returned demonstration;Verbalized understanding          PT Short Term Goals - 10/11/16 1419      PT SHORT TERM GOAL #1   Title Pt will demonstrate consistency and independence with his HEP to decrease pain and improve shoulder ROM.    Time 3   Period Weeks   Status New     PT SHORT TERM GOAL #2   Title  Pt will demo improved postural awareness, evident by his ability to self-correct posture during a session, with therapist cuing no more than 50% of the time.    Time 3   Period Weeks   Status New     PT SHORT TERM GOAL #3   Title Pt will demo improved Lt shoulder AROM to WNL of the RLE, to improve his ability to reach overhead during tasks.    Time 3   Period Weeks   Status New           PT Long Term Goals - 10/11/16 1420      PT LONG TERM GOAL #1   Title Pt will demo improved Lt shoulder strength to 5/5 MMT, which will increase his ability to complete daily household activities such as vacuuming and sweeping.    Time 6   Period Weeks   Status New     PT LONG TERM GOAL #2   Title Pt will demo atleast a 9 point improvement on the UEFS, to indicate a significant improvement in functional strength of the UEs.   Time 6   Period Weeks   Status New      PT LONG TERM GOAL #3   Title Pt will demo improved Lt grip strength to within 5lb of the RLE, to improve his ability to open a jar at home.    Time 6   Period Weeks   Status New               Plan - 11/01/16 1015    Clinical Impression Statement Today's session continued with exercise to improve shoulder abduction and external rotation strength. Therapist also addressed noted limitations in Lt Latissimus and pectoralis tightness contributing to his poor scapular position. Completed myofascial release techniques along with trigger point dry needling technique with noted twitch response and resulting palpable increase in muscle length. Ended session without reported increase in his pain. Will continue with current POC.   Rehab Potential Good   Clinical Impairments Affecting Rehab Potential (+) age and motivation to improve    PT Frequency 2x / week   PT Duration 6 weeks   PT Treatment/Interventions ADLs/Self Care Home Management;Cryotherapy;Moist Heat;Therapeutic exercise;Therapeutic activities;Neuromuscular re-education;Patient/family education;Manual techniques;Dry needling;Passive range of motion   PT Next Visit Plan seated chin tucks; manual to address pec minor/major and lattisimus; focus on shoulder ER/ABD strength in supine/sitting positions and progress as able, serratus pushups in incline position   PT Home Exercise Plan seated shoulder ER with red TB, standing tricep extension with red TB   Consulted and Agree with Plan of Care Patient      Patient will benefit from skilled therapeutic intervention in order to improve the following deficits and impairments:  Decreased range of motion, Decreased strength, Hypomobility, Impaired flexibility, Postural dysfunction, Increased muscle spasms, Improper body mechanics, Pain, Impaired perceived functional ability  Visit Diagnosis: Left shoulder pain, unspecified chronicity  Right shoulder pain, unspecified chronicity  Muscle  weakness (generalized)  Other symptoms and signs involving the musculoskeletal system  Stiffness of left shoulder, not elsewhere classified     Problem List Patient Active Problem List   Diagnosis Date Noted  . Perennial allergic rhinitis 10/01/2016  . Chronic urticaria 10/01/2016  . Stiffness of joints, not elsewhere classified, multiple sites 06/07/2013  . Cervical pain (neck) 06/07/2013  . Spasm of muscle 06/07/2013    10:55 AM,11/01/16 Marylyn Ishihara PT, DPT [facility] [phone]  [facility]  [address] [place], [place], [address] Phone: [phone]   Fax:  [phone]  Name: Randy [name] MRN: [id number] Date of Birth: [birth year]

## 2016-11-05 ENCOUNTER — Ambulatory Visit (HOSPITAL_COMMUNITY): Payer: Non-veteran care

## 2016-11-05 DIAGNOSIS — M25512 Pain in left shoulder: Secondary | ICD-10-CM

## 2016-11-05 DIAGNOSIS — M25511 Pain in right shoulder: Secondary | ICD-10-CM

## 2016-11-05 DIAGNOSIS — M6281 Muscle weakness (generalized): Secondary | ICD-10-CM

## 2016-11-05 DIAGNOSIS — R29898 Other symptoms and signs involving the musculoskeletal system: Secondary | ICD-10-CM

## 2016-11-05 DIAGNOSIS — M25612 Stiffness of left shoulder, not elsewhere classified: Secondary | ICD-10-CM

## 2016-11-05 NOTE — Therapy (Signed)
[facility] [address] [place], [place], [address] Phone: [phone]   Fax:  [phone]  Physical Therapy Treatment/Reassessment and G-Codes  Patient Details  Name: Randy [name] MRN: [id number] Date of Birth: 07-Sep-1950 Referring Provider: Cottie Banda, MD  Encounter Date: 11/05/2016      PT End of Session - 11/05/16 0910    Visit Number 7   Number of Visits 13   Date for PT Re-Evaluation 11/22/16   Authorization Type [facility]   Authorization Time Period 10/11/16 to 11/22/16   PT Start Time 0905   PT Stop Time 0948   PT Time Calculation (min) 43 min   Activity Tolerance Patient tolerated treatment well;No increased pain   Behavior During Therapy [facility] for tasks assessed/performed      No past medical history on file.  Past Surgical History:  Procedure Laterality Date  . NECK SURGERY  2015  . TONSILLECTOMY      There were no vitals filed for this visit.      Subjective Assessment - 11/05/16 0901    Subjective Pt reports his arm is improving with ROM, continues to have some pain with certain movements.   Pertinent History neck surgery 2015   Patient Stated Goals improve strength    Currently in Pain? No/denies  9/37 with certain movements            [facility] PT Assessment - 11/05/16 0001      Assessment   Medical Diagnosis Lt bicep tendonitis   Referring Provider Cottie Banda, MD   Onset Date/Surgical Date --  ~6 months ago   Hand Dominance Right   Prior Therapy none      Observation/Other Assessments   Upper Extremity Functional Index  49/80  was 39/80     AROM   AROM Assessment Site Shoulder   Right/Left Shoulder Right;Left   Right Shoulder Flexion 156 Degrees  was 140   Right Shoulder ABduction 158 Degrees  was 140   Left Shoulder Flexion 130 Degrees  was 130   Left Shoulder ABduction 125 Degrees  was 110   Thoracic Flexion moderate limitaion  was limited moderate   Thoracic Extension severe  limitation   was severe limitation   Thoracic - Right [facility]    Thoracic - Left [place] [facility]    Thoracic - Right Rotation moderate limitation   was moderate limitation   Thoracic - Left Rotation moderate limitation   was limited moderate                     [facility] Adult PT Treatment/Exercise - 11/05/16 0001      Shoulder Exercises: Supine   Other Supine Exercises shoulder elevation with Abd hold with red TB around wrist, 2x10 reps      Shoulder Exercises: Seated   Retraction Both;15 reps   Retraction Limitations cervical and scapula retraction   Other Seated Exercises seated chin tucks x15 reps      Shoulder Exercises: Standing   Other Standing Exercises BUE ER slide on wall with lift off hold x5", 2x5 reps      Manual Therapy   Manual Therapy Soft tissue mobilization   Manual therapy comments performed separately from all other skilled services    Soft tissue mobilization Lt pec major, Lt Latissimus and anterior deltiod/bicep region                  PT Short Term Goals - 11/05/16 1502  PT SHORT TERM GOAL #1   Title Pt will demonstrate consistency and independence with his HEP to decrease pain and improve shoulder ROM.    Time 3   Period Weeks   Status On-going     PT SHORT TERM GOAL #2   Title Pt will demo improved postural awareness, evident by his ability to self-correct posture during a session, with therapist cuing no more than 50% of the time.    Time 3   Period Weeks   Status New     PT SHORT TERM GOAL #3   Title Pt will demo improved Lt shoulder AROM to WNL of the RLE, to improve his ability to reach overhead during tasks.    Time 3   Period Weeks   Status Partially Met           PT Long Term Goals - 11/05/16 1503      PT LONG TERM GOAL #1   Title Pt will demo improved Lt shoulder strength to 5/5 MMT, which will increase his ability to complete daily household activities such as vacuuming and sweeping.    Time 6    Period Weeks   Status Deferred     PT LONG TERM GOAL #2   Title Pt will demo atleast a 9 point improvement on the UEFS, to indicate a significant improvement in functional strength of the UEs.   Baseline 10 point improvement   Time 6   Period Weeks   Status On-going     PT LONG TERM GOAL #3   Title Pt will demo improved Lt grip strength to within 5lb of the RLE, to improve his ability to open a jar at home.    Time 6   Period Weeks   Status Deferred               Plan - 11/05/16 1449    Clinical Impression Statement Session foucs on improving shoulder mobility and strengthening primarly for abduciton and ER.  ROM measurement complete with improved range every direciton though does continue to be limited.  Pt continued to exhibit latissimus dorsi and pectoralis tightness contributing to his poor postural position and scapular alignment.  Manual techniques complete to address restrictions.   Pt with improved self perceived functional abilities noted by improved score with UEFI from 39/80 to 49/80.  No reports of increased pain through session.     Rehab Potential Good   Clinical Impairments Affecting Rehab Potential (+) age and motivation to improve    PT Frequency 2x / week   PT Duration 6 weeks   PT Treatment/Interventions ADLs/Self Care Home Management;Cryotherapy;Moist Heat;Therapeutic exercise;Therapeutic activities;Neuromuscular re-education;Patient/family education;Manual techniques;Dry needling;Passive range of motion   PT Next Visit Plan Continue wiht seated chin tucks; manual to address pec minor/major and lattisimus; focus on shoulder ER/ABD strength in supine/sitting positions and progress as able, serratus pushups in incline position   PT Home Exercise Plan seated shoulder ER with red TB, standing tricep extension with red TB      Patient will benefit from skilled therapeutic intervention in order to improve the following deficits and impairments:  Decreased range of  motion, Decreased strength, Hypomobility, Impaired flexibility, Postural dysfunction, Increased muscle spasms, Improper body mechanics, Pain, Impaired perceived functional ability  Visit Diagnosis: Left shoulder pain, unspecified chronicity  Right shoulder pain, unspecified chronicity  Muscle weakness (generalized)  Other symptoms and signs involving the musculoskeletal system  Stiffness of left shoulder, not elsewhere classified  G-Codes - 11/05/16 1504    Functional Assessment Tool Used (Outpatient Only) Clinical judgement based on assessment of ROM, strength and UEFS.   Functional Limitation Carrying, moving and handling objects   Carrying, Moving and Handling Objects Current Status [phone]) At least 40 percent but less than 60 percent impaired, limited or restricted   Carrying, Moving and Handling Objects Goal Status (N5583) At least 20 percent but less than 40 percent impaired, limited or restricted      Problem List Patient Active Problem List   Diagnosis Date Noted  . Perennial allergic rhinitis 10/01/2016  . Chronic urticaria 10/01/2016  . Stiffness of joints, not elsewhere classified, multiple sites 06/07/2013  . Cervical pain (neck) 06/07/2013  . Spasm of muscle 06/07/2013   Ihor Austin, LPTA; CBIS [phone]  G-codes updated and several goals addressed this session per measurements taken by Ihor Austin, PTA.   3:07 PM,11/05/16 [name] PT, DPT [facility] [place] [address] [place], [place], [address] Phone: [phone]   Fax:  [phone]  Name: Randy [name] MRN: [id number] Date of Birth: 04-08-1951

## 2016-11-08 ENCOUNTER — Ambulatory Visit (HOSPITAL_COMMUNITY): Payer: Non-veteran care | Attending: Nurse Practitioner | Admitting: Physical Therapy

## 2016-11-08 DIAGNOSIS — M25612 Stiffness of left shoulder, not elsewhere classified: Secondary | ICD-10-CM | POA: Diagnosis present

## 2016-11-08 DIAGNOSIS — M25512 Pain in left shoulder: Secondary | ICD-10-CM | POA: Insufficient documentation

## 2016-11-08 DIAGNOSIS — R29898 Other symptoms and signs involving the musculoskeletal system: Secondary | ICD-10-CM | POA: Insufficient documentation

## 2016-11-08 DIAGNOSIS — M25511 Pain in right shoulder: Secondary | ICD-10-CM | POA: Diagnosis present

## 2016-11-08 DIAGNOSIS — M6281 Muscle weakness (generalized): Secondary | ICD-10-CM | POA: Insufficient documentation

## 2016-11-08 NOTE — Therapy (Signed)
[facility] [address] [place], [place], [address] Phone: [phone]   Fax:  [phone]  Physical Therapy Treatment  Patient Details  Name: Randy [name] MRN: [id number] Date of Birth: July 22, 1950 Referring Provider: Cottie Banda, MD  Encounter Date: 11/08/2016      PT End of Session - 11/08/16 0924    Visit Number 8   Number of Visits 13   Date for PT Re-Evaluation 11/22/16   Authorization Type [facility]   Authorization Time Period 10/11/16 to 11/22/16   PT Start Time 0905   PT Stop Time 0946   PT Time Calculation (min) 41 min   Activity Tolerance Patient tolerated treatment well;No increased pain   Behavior During Therapy [facility] for tasks assessed/performed      No past medical history on file.  Past Surgical History:  Procedure Laterality Date  . NECK SURGERY  2015  . TONSILLECTOMY      There were no vitals filed for this visit.      Subjective Assessment - 11/08/16 0906    Subjective Pt reports that his arm is doing ok today. He has no complaints at this time.    Pertinent History neck surgery 2015   Patient Stated Goals improve strength    Currently in Pain? No/denies                         [facility] Adult PT Treatment/Exercise - 11/08/16 0001      Shoulder Exercises: Supine   Other Supine Exercises shoulder elevation with Abd hold with red TB around wrist, 1x15 reps first set, x10 reps second set      Shoulder Exercises: Seated   External Rotation Both;20 reps;Theraband   External Rotation Limitations red TB, limited Lt shoulder ER      Shoulder Exercises: Prone   Other Prone Exercises Blackburn 5 and 6 2x10, LLE only     Shoulder Exercises: ROM/Strengthening   Other ROM/Strengthening Exercises plank walk on countertop Lt and Rt with yellow TB x4 RT     Manual Therapy   Manual therapy comments performed separately from all other skilled services    Soft tissue mobilization Lt posterior shoulder,  teres/latissimus and infraspinatus   Myofascial Release TrP release Lt latissimus (proximal)  and Lt infraspinatus                 PT Education - 11/08/16 0923    Education provided Yes   Education Details discussed improvements in ROM and performance during session; benefits of needling treatment to address restrictions along the shoulder    Person(s) Educated Patient   Methods Explanation;Verbal cues   Comprehension Verbalized understanding;Returned demonstration          PT Short Term Goals - 11/05/16 1502      PT SHORT TERM GOAL #1   Title Pt will demonstrate consistency and independence with his HEP to decrease pain and improve shoulder ROM.    Time 3   Period Weeks   Status On-going     PT SHORT TERM GOAL #2   Title Pt will demo improved postural awareness, evident by his ability to self-correct posture during a session, with therapist cuing no more than 50% of the time.    Time 3   Period Weeks   Status New     PT SHORT TERM GOAL #3   Title Pt will demo improved Lt shoulder AROM to WNL of the RLE, to improve his  ability to reach overhead during tasks.    Time 3   Period Weeks   Status Partially Met           PT Long Term Goals - 11/05/16 1503      PT LONG TERM GOAL #1   Title Pt will demo improved Lt shoulder strength to 5/5 MMT, which will increase his ability to complete daily household activities such as vacuuming and sweeping.    Time 6   Period Weeks   Status Deferred     PT LONG TERM GOAL #2   Title Pt will demo atleast a 9 point improvement on the UEFS, to indicate a significant improvement in functional strength of the UEs.   Baseline 10 point improvement   Time 6   Period Weeks   Status On-going     PT LONG TERM GOAL #3   Title Pt will demo improved Lt grip strength to within 5lb of the RLE, to improve his ability to open a jar at home.    Time 6   Period Weeks   Status Deferred               Plan - 11/08/16 0949     Clinical Impression Statement Pt continues to make progress with near full and pain free AROM upon arrival this session. Continued with therex to progress posterior shoulder strength and pt demonstrated signs of fatigue with shaking and muscle burning. Also completed manual techniques to decrease muscle spasm and improve  activation of the shoulder. Pt reporting no increase in pain by the end of the session.    Rehab Potential Good   Clinical Impairments Affecting Rehab Potential (+) age and motivation to improve    PT Frequency 2x / week   PT Duration 6 weeks   PT Treatment/Interventions ADLs/Self Care Home Management;Cryotherapy;Moist Heat;Therapeutic exercise;Therapeutic activities;Neuromuscular re-education;Patient/family education;Manual techniques;Dry needling;Passive range of motion   PT Next Visit Plan Continue with manual to address pec minor/major and lattisimus; tricep/shoulder ER strength and update HEP   PT Home Exercise Plan seated shoulder ER with red TB, standing tricep extension with red TB   Consulted and Agree with Plan of Care Patient      Patient will benefit from skilled therapeutic intervention in order to improve the following deficits and impairments:  Decreased range of motion, Decreased strength, Hypomobility, Impaired flexibility, Postural dysfunction, Increased muscle spasms, Improper body mechanics, Pain, Impaired perceived functional ability  Visit Diagnosis: Left shoulder pain, unspecified chronicity  Right shoulder pain, unspecified chronicity  Muscle weakness (generalized)  Other symptoms and signs involving the musculoskeletal system  Stiffness of left shoulder, not elsewhere classified     Problem List Patient Active Problem List   Diagnosis Date Noted  . Perennial allergic rhinitis 10/01/2016  . Chronic urticaria 10/01/2016  . Stiffness of joints, not elsewhere classified, multiple sites 06/07/2013  . Cervical pain (neck) 06/07/2013  . Spasm of  muscle 06/07/2013     9:53 AM,11/08/16 [name] PT, DPT [facility] [address] [place], [place], [address] Phone: [phone]   Fax:  [phone]  Name: Randy [name] MRN: [id number] Date of Birth: 1950/07/07

## 2016-11-11 ENCOUNTER — Ambulatory Visit (HOSPITAL_COMMUNITY): Payer: Non-veteran care

## 2016-11-11 DIAGNOSIS — M25512 Pain in left shoulder: Secondary | ICD-10-CM

## 2016-11-11 DIAGNOSIS — M6281 Muscle weakness (generalized): Secondary | ICD-10-CM

## 2016-11-11 DIAGNOSIS — R29898 Other symptoms and signs involving the musculoskeletal system: Secondary | ICD-10-CM

## 2016-11-11 DIAGNOSIS — M25511 Pain in right shoulder: Secondary | ICD-10-CM

## 2016-11-11 DIAGNOSIS — M25612 Stiffness of left shoulder, not elsewhere classified: Secondary | ICD-10-CM

## 2016-11-11 NOTE — Therapy (Signed)
[facility] [place], [place], [address] Phone: [phone]   Fax:  [phone]  Physical Therapy Treatment  Patient Details  Name: Randy [name] MRN: [id number] Date of Birth: 07-06-1950 Referring Provider: Cottie Banda, MD  Encounter Date: 11/11/2016      PT End of Session - 11/11/16 1038    Visit Number 9   Number of Visits 13   Date for PT Re-Evaluation 11/22/16   Authorization Type [facility]   Authorization Time Period 10/11/16 to 11/22/16   PT Start Time 1033   PT Stop Time 1114   PT Time Calculation (min) 41 min   Activity Tolerance Patient tolerated treatment well;No increased pain   Behavior During Therapy [facility] for tasks assessed/performed      No past medical history on file.  Past Surgical History:  Procedure Laterality Date  . NECK SURGERY  2015  . TONSILLECTOMY      There were no vitals filed for this visit.      Subjective Assessment - 11/11/16 1038    Subjective Pt stated his arm is doing good today, minimal pain with certain movements.   Pertinent History neck surgery 2015   Patient Stated Goals improve strength    Currently in Pain? No/denies                         [facility] Adult PT Treatment/Exercise - 11/11/16 0001      Shoulder Exercises: Supine   Other Supine Exercises shoulder flexion with Abd hold with red TB around wrist, 1x15 reps first set, x10 reps second set      Shoulder Exercises: Seated   External Rotation Both;20 reps;Theraband   External Rotation Limitations red TB, limited Lt shoulder ER      Shoulder Exercises: Prone   Other Prone Exercises Blackburn 5 and 6 2x10, LLE only     Shoulder Exercises: Standing   Other Standing Exercises BUE ER slide on wall with lift off hold x5", 2x5 reps    Other Standing Exercises tricep extension with green TB 2x15 reps, therapist providing cues for full elbow extension      Manual Therapy   Manual Therapy Soft tissue  mobilization   Manual therapy comments performed separately from all other skilled services    Soft tissue mobilization Lt posterior shoulder, teres/latissimus and infraspinatus   Myofascial Release TrP release Lt latissimus (proximal)  and Lt infraspinatus                   PT Short Term Goals - 11/05/16 1502      PT SHORT TERM GOAL #1   Title Pt will demonstrate consistency and independence with his HEP to decrease pain and improve shoulder ROM.    Time 3   Period Weeks   Status On-going     PT SHORT TERM GOAL #2   Title Pt will demo improved postural awareness, evident by his ability to self-correct posture during a session, with therapist cuing no more than 50% of the time.    Time 3   Period Weeks   Status New     PT SHORT TERM GOAL #3   Title Pt will demo improved Lt shoulder AROM to WNL of the RLE, to improve his ability to reach overhead during tasks.    Time 3   Period Weeks   Status Partially Met           PT Long Term  Goals - 11/05/16 1503      PT LONG TERM GOAL #1   Title Pt will demo improved Lt shoulder strength to 5/5 MMT, which will increase his ability to complete daily household activities such as vacuuming and sweeping.    Time 6   Period Weeks   Status Deferred     PT LONG TERM GOAL #2   Title Pt will demo atleast a 9 point improvement on the UEFS, to indicate a significant improvement in functional strength of the UEs.   Baseline 10 point improvement   Time 6   Period Weeks   Status On-going     PT LONG TERM GOAL #3   Title Pt will demo improved Lt grip strength to within 5lb of the RLE, to improve his ability to open a jar at home.    Time 6   Period Weeks   Status Deferred               Plan - 11/11/16 1119    Clinical Impression Statement Pt making good progress with improved ARAOM pain free.  Continued session focus on posterior shoulder strengthening and end range with abduction and ER.  Therapist facilitation to  reduce compensation with wrist and overall UE movements for maximal benefits.  Pt continues to demonstrate visible signs of verbal reports of fatigue due to weakness.  No reports of increased pain through session.  Continues with manual soft tissue mobilization and trigger point release to Lt pecs major improve musculature lengthening for ROM   Rehab Potential Good   Clinical Impairments Affecting Rehab Potential (+) age and motivation to improve    PT Frequency 2x / week   PT Duration 6 weeks   PT Treatment/Interventions ADLs/Self Care Home Management;Cryotherapy;Moist Heat;Therapeutic exercise;Therapeutic activities;Neuromuscular re-education;Patient/family education;Manual techniques;Dry needling;Passive range of motion   PT Next Visit Plan MD apt on 11/14/16, reassess next session.  Continue with manual to address pec minor/major and lattisimus; tricep/shoulder ER strength and update HEP   PT Home Exercise Plan seated shoulder ER with red TB, standing tricep extension with red TB      Patient will benefit from skilled therapeutic intervention in order to improve the following deficits and impairments:  Decreased range of motion, Decreased strength, Hypomobility, Impaired flexibility, Postural dysfunction, Increased muscle spasms, Improper body mechanics, Pain, Impaired perceived functional ability  Visit Diagnosis: Left shoulder pain, unspecified chronicity  Right shoulder pain, unspecified chronicity  Muscle weakness (generalized)  Other symptoms and signs involving the musculoskeletal system  Stiffness of left shoulder, not elsewhere classified     Problem List Patient Active Problem List   Diagnosis Date Noted  . Perennial allergic rhinitis 10/01/2016  . Chronic urticaria 10/01/2016  . Stiffness of joints, not elsewhere classified, multiple sites 06/07/2013  . Cervical pain (neck) 06/07/2013  . Spasm of muscle 06/07/2013   Ihor Austin, LPTA;  CBIS [phone]  Aldona Lento 11/11/2016, 11:30 AM  [facility], [place], [address] Phone: [phone]   Fax:  [phone]  Name: Randy [name] MRN: [id number] Date of Birth: 1950-10-22

## 2016-11-13 ENCOUNTER — Ambulatory Visit (HOSPITAL_COMMUNITY): Payer: Non-veteran care | Admitting: Physical Therapy

## 2016-11-13 DIAGNOSIS — M25512 Pain in left shoulder: Secondary | ICD-10-CM

## 2016-11-13 DIAGNOSIS — M25511 Pain in right shoulder: Secondary | ICD-10-CM

## 2016-11-13 DIAGNOSIS — R29898 Other symptoms and signs involving the musculoskeletal system: Secondary | ICD-10-CM

## 2016-11-13 DIAGNOSIS — M6281 Muscle weakness (generalized): Secondary | ICD-10-CM

## 2016-11-13 DIAGNOSIS — M25612 Stiffness of left shoulder, not elsewhere classified: Secondary | ICD-10-CM

## 2016-11-13 NOTE — Therapy (Signed)
[place], [place], [address] Phone: [phone]   Fax:  [phone]  Physical Therapy Treatment  Patient Details  Name: Randy [name] MRN: [id number] Date of Birth: April 13, 1950 Referring Provider: Cottie Banda, MD  Encounter Date: 11/13/2016      PT End of Session - 11/13/16 1128    Visit Number 10   Number of Visits 13   Date for PT Re-Evaluation 11/22/16   Authorization Type VA   Authorization Time Period 10/11/16 to 11/22/16   PT Start Time 0902   PT Stop Time 0945   PT Time Calculation (min) 43 min   Activity Tolerance Patient tolerated treatment well;No increased pain   Behavior During Therapy [facility] for tasks assessed/performed      No past medical history on file.  Past Surgical History:  Procedure Laterality Date  . NECK SURGERY  2015  . TONSILLECTOMY      There were no vitals filed for this visit.      Subjective Assessment - 11/13/16 0908    Subjective Pt reports that things are going well. He has his MD appointment tomorrow and is hoping that they will not decide on surgery.    Pertinent History neck surgery 2015   Patient Stated Goals improve strength    Currently in Pain? No/denies                         [facility] Adult PT Treatment/Exercise - 11/13/16 0001      Shoulder Exercises: Seated   Other Seated Exercises seated thoracic extension x10 reps mid to upper thoracic spine      Shoulder Exercises: Sidelying   ABduction Left;20 reps;Theraband   Theraband Level (Shoulder ABduction) Level 1 (Yellow)     Shoulder Exercises: Standing   Row Both;15 reps   Theraband Level (Shoulder Row) Level 3 (Green)   Row Limitations x2 sets    Other Standing Exercises BUE ER slide on wall (yellow TB around wrist) with lift off hold x5", x10 reps   Other Standing Exercises tricep extension with blue TB 2x15 reps      Manual Therapy   Manual therapy comments performed separately from  all other skilled services    Myofascial Release TrP release Lt bicep, Lt proximal latissimus and pec minor   Muscle Energy Technique Lt pic minor contract, relax, stretch                 PT Education - 11/13/16 0944    Education provided Yes   Education Details discussed progress and upcoming re-evaluation; technique with therex    Person(s) Educated Patient   Methods Explanation;Verbal cues   Comprehension Verbalized understanding          PT Short Term Goals - 11/05/16 1502      PT SHORT TERM GOAL #1   Title Pt will demonstrate consistency and independence with his HEP to decrease pain and improve shoulder ROM.    Time 3   Period Weeks   Status On-going     PT SHORT TERM GOAL #2   Title Pt will demo improved postural awareness, evident by his ability to self-correct posture during a session, with therapist cuing no more than 50% of the time.    Time 3   Period Weeks   Status New     PT SHORT TERM GOAL #3   Title Pt will demo improved Lt shoulder AROM to WNL of the  RLE, to improve his ability to reach overhead during tasks.    Time 3   Period Weeks   Status Partially Met           PT Long Term Goals - 11/05/16 1503      PT LONG TERM GOAL #1   Title Pt will demo improved Lt shoulder strength to 5/5 MMT, which will increase his ability to complete daily household activities such as vacuuming and sweeping.    Time 6   Period Weeks   Status Deferred     PT LONG TERM GOAL #2   Title Pt will demo atleast a 9 point improvement on the UEFS, to indicate a significant improvement in functional strength of the UEs.   Baseline 10 point improvement   Time 6   Period Weeks   Status On-going     PT LONG TERM GOAL #3   Title Pt will demo improved Lt grip strength to within 5lb of the RLE, to improve his ability to open a jar at home.    Time 6   Period Weeks   Status Deferred               Plan - 11/13/16 1128    Clinical Impression Statement  Continued this session with exercise to improve shoulder strength and endurance. Pt was able to complete all exercises with improved technique, however continue to note shoulder weakness during elevation into end ranges of abduction. Also addressed muscle restrictions along the pectoral region with pt reporting improved flexibility afterwards. Will continue with current POC, and complete re-evaluation at next session.     Rehab Potential Good   Clinical Impairments Affecting Rehab Potential (+) age and motivation to improve    PT Frequency 2x / week   PT Duration 6 weeks   PT Treatment/Interventions ADLs/Self Care Home Management;Cryotherapy;Moist Heat;Therapeutic exercise;Therapeutic activities;Neuromuscular re-education;Patient/family education;Manual techniques;Dry needling;Passive range of motion   PT Next Visit Plan Re-evaluation next session     PT Home Exercise Plan seated shoulder ER with red TB, standing tricep extension with red TB      Patient will benefit from skilled therapeutic intervention in order to improve the following deficits and impairments:  Decreased range of motion, Decreased strength, Hypomobility, Impaired flexibility, Postural dysfunction, Increased muscle spasms, Improper body mechanics, Pain, Impaired perceived functional ability  Visit Diagnosis: Left shoulder pain, unspecified chronicity  Muscle weakness (generalized)  Other symptoms and signs involving the musculoskeletal system  Stiffness of left shoulder, not elsewhere classified  Right shoulder pain, unspecified chronicity     Problem List Patient Active Problem List   Diagnosis Date Noted  . Perennial allergic rhinitis 10/01/2016  . Chronic urticaria 10/01/2016  . Stiffness of joints, not elsewhere classified, multiple sites 06/07/2013  . Cervical pain (neck) 06/07/2013  . Spasm of muscle 06/07/2013   12:04 PM,11/13/16 [name] PT, DPT [facility] [place] [address] [place], [place], [address] Phone: [phone]   Fax:  [phone]  Name: Randy [name] MRN: [id number] Date of Birth: 06/04/1950

## 2016-11-18 ENCOUNTER — Ambulatory Visit (HOSPITAL_COMMUNITY): Payer: Non-veteran care | Admitting: Physical Therapy

## 2016-11-18 DIAGNOSIS — R29898 Other symptoms and signs involving the musculoskeletal system: Secondary | ICD-10-CM

## 2016-11-18 DIAGNOSIS — M25512 Pain in left shoulder: Secondary | ICD-10-CM | POA: Diagnosis not present

## 2016-11-18 DIAGNOSIS — M25511 Pain in right shoulder: Secondary | ICD-10-CM

## 2016-11-18 DIAGNOSIS — M25612 Stiffness of left shoulder, not elsewhere classified: Secondary | ICD-10-CM

## 2016-11-18 DIAGNOSIS — M6281 Muscle weakness (generalized): Secondary | ICD-10-CM

## 2016-11-18 NOTE — Therapy (Addendum)
[name], [place], [address] Phone: [phone]   Fax:  [phone]  Physical Therapy Treatment/Re-evaluation  Patient Details  Name: Randy [name] MRN: [id number] Date of Birth: Feb 19, 1951 Referring Provider: Cottie Banda, MD   Encounter Date: 11/18/2016      PT End of Session - 11/18/16 0945    Visit Number 11   Number of Visits 19   Date for PT Re-Evaluation 12/20/16   Authorization Type VA   Authorization Time Period 10/11/16 to 11/22/16 NEW: 11/23/16 to 12/20/16   PT Start Time 0905   PT Stop Time 0945   PT Time Calculation (min) 40 min   Activity Tolerance Patient tolerated treatment well;No increased pain   Behavior During Therapy [facility] for tasks assessed/performed      No past medical history on file.  Past Surgical History:  Procedure Laterality Date  . NECK SURGERY  2015  . TONSILLECTOMY      There were no vitals filed for this visit.      Subjective Assessment - 11/18/16 0909    Subjective Pt reports that he got his MD appointment mixed up and doesn't go back until sometime later this month. He feels that overall his shoulder is about 50% improved since the start of rehab. He is trying to complete his exercises at home when he "gets bored with the TV". He has even been trying to walk more for exercise.    Pertinent History neck surgery 2015   Limitations Lifting   Patient Stated Goals improve strength    Currently in Pain? No/denies            [facility] PT Assessment - 11/18/16 0001      Assessment   Medical Diagnosis Lt bicep tendonitis   Referring Provider Randy Banda, MD    Onset Date/Surgical Date --  ~6 months ago   Hand Dominance Right   Next MD Visit 11/25/16   Prior Therapy none      Precautions   Precautions None     Balance Screen   Has the patient fallen in the past 6 months No   Has the patient had a decrease in activity level because of a fear of falling?  No   Is  the patient reluctant to leave their home because of a fear of falling?  No     Prior Function   Level of Independence Independent     Cognition   Overall Cognitive Status Within Functional Limits for tasks assessed     Observation/Other Assessments   Observations noted atrophy on the posterior Lt shoulder complex compared to the Rt   Other Surveys  Other Surveys   Upper Extremity Functional Index  50/80     Sensation   Additional Comments Rt hand numbness reported       Posture/Postural Control   Posture Comments forward head, rounded shoulders      AROM   Right Shoulder Flexion 140 Degrees   Right Shoulder ABduction 140 Degrees   Left Shoulder Flexion 135 Degrees  seated   Left Shoulder ABduction 140 Degrees  seated   Thoracic Extension severe limitation     PROM   Overall PROM  Within functional limits for tasks performed   Overall PROM Comments full PROM, discomfort with end range flexion     Strength   Overall Strength Comments Lt shoulder scaption 4/5, pain free    Right Shoulder Flexion 5/5   Right Shoulder ABduction 5/5  Right Shoulder Internal Rotation 5/5   Right Shoulder External Rotation 5/5   Left Shoulder Flexion 3+/5  pain free   Left Shoulder ABduction 4-/5   Left Shoulder Internal Rotation 5/5   Left Shoulder External Rotation 4+/5   Left Shoulder Horizontal ADduction 5/5   Right Elbow Flexion 5/5   Right Elbow Extension 5/5   Left Elbow Flexion 5/5   Left Elbow Extension 5/5   Right Hand Grip (lbs) 85.3  avg of 3    Left Hand Grip (lbs) 80.3  avg of 3      Palpation   Palpation comment tenderness along biceps tendon, head of biceps                     [facility] Adult PT Treatment/Exercise - 11/18/16 0001      Shoulder Exercises: Sidelying   ABduction Left;5 reps   ABduction Limitations HEP demo      Shoulder Exercises: Standing   Other Standing Exercises Lt shoulder flexion against wall x5 reps for HEP demo                  PT Education - 11/18/16 1040    Education provided Yes   Education Details reviewed goals and progress made; updated and reviewed HEP; importance of addressing thoracic mobility    Person(s) Educated Patient   Methods Explanation;Verbal cues;Handout   Comprehension Returned demonstration;Verbalized understanding          PT Short Term Goals - 11/18/16 0929      PT SHORT TERM GOAL #1   Title Pt will demonstrate consistency and independence with his HEP to decrease pain and improve shoulder ROM.    Time 2   Period Weeks   Status On-going   Target Date 12/02/16     PT SHORT TERM GOAL #2   Title Pt will demo improved postural awareness, evident by his ability to self-correct posture during a session, with therapist cuing no more than 50% of the time.    Time 2   Period Weeks   Status Deferred     PT SHORT TERM GOAL #3   Title Pt will demo improved Lt shoulder AROM to WNL of the RLE, to improve his ability to reach overhead during tasks.    Baseline almost 10 deg improved from initial eval   Time 2   Period Weeks   Status On-going           PT Long Term Goals - 11/18/16 0931      PT LONG TERM GOAL #1   Title Pt will demo improved Lt shoulder strength to 5/5 MMT, which will increase his ability to complete daily household activities such as vacuuming and sweeping.    Baseline up to 4/5 MMT in several muscle groups    Time 6   Period Weeks   Status Partially Met   Target Date 12/16/16     PT LONG TERM GOAL #2   Title Pt will demo atleast a 9 point improvement on the UEFS, to indicate a significant improvement in functional strength of the UEs.   Baseline 11 point improvement from initial eval    Time 4   Period Weeks   Status On-going     PT LONG TERM GOAL #3   Title Pt will demo improved Lt grip strength to within 5lb of the RLE, to improve his ability to open a jar at home.    Time 4   Period Weeks  Status Achieved                Plan - November 20, 2016 0945    Clinical Impression Statement Pt has made steady progress towards his goals since beginning PT, demonstrating close to 10 deg of improvement with Lt shoulder AROM, improved Lt shoulder strength and overall decrease in his report of pain since the start of PT. Pt scored 11 points higher on the upper extremity functional index, which is statistically significant and demonstrates an improvement in his LUE function throughout the day. His grip strength has also improved significantly, to within 5lb of the Rt hand. Overall, the pt feels he has improved 50% since starting pt and has increasing become more adherent with his HEP. Due to the pt's positive results with PT intervention, he would benefit from an extension of his POC, to further address his remaining limitations in Lt shoulder flexion/abduction strength, thoracic mobility and improve his independence and use of the LUE during daily tasks.    History and Personal Factors relevant to plan of care: history of cervical surgery, Lt and Rt shoulder pain   Clinical Presentation Evolving   Clinical Presentation due to: improving    Clinical Decision Making Low   Rehab Potential Good   Clinical Impairments Affecting Rehab Potential (+) age and motivation to improve    PT Frequency 2x / week   PT Duration 4 weeks   PT Treatment/Interventions ADLs/Self Care Home Management;Cryotherapy;Moist Heat;Therapeutic exercise;Therapeutic activities;Neuromuscular re-education;Patient/family education;Manual techniques;Dry needling;Passive range of motion   PT Next Visit Plan Re-evaluation next session     PT Home Exercise Plan seated shoulder ER with red TB, shoulder flexion wall slides, sidelying shoulder abduction    Consulted and Agree with Plan of Care Patient      Patient will benefit from skilled therapeutic intervention in order to improve the following deficits and impairments:  Decreased range of motion, Decreased strength,  Hypomobility, Impaired flexibility, Postural dysfunction, Increased muscle spasms, Improper body mechanics, Pain, Impaired perceived functional ability  Visit Diagnosis: Left shoulder pain, unspecified chronicity  Muscle weakness (generalized)  Other symptoms and signs involving the musculoskeletal system  Stiffness of left shoulder, not elsewhere classified  Right shoulder pain, unspecified chronicity       G-Codes - 11-20-16 0949    Functional Assessment Tool Used (Outpatient Only) Clinical judgement based on assessment of ROM, strength and UEFI   Functional Limitation Carrying, moving and handling objects   Carrying, Moving and Handling Objects Current Status (M2500) At least 40 percent but less than 60 percent impaired, limited or restricted   Carrying, Moving and Handling Objects Goal Status (B7048) At least 20 percent but less than 40 percent impaired, limited or restricted      Problem List Patient Active Problem List   Diagnosis Date Noted  . Perennial allergic rhinitis 10/01/2016  . Chronic urticaria 10/01/2016  . Stiffness of joints, not elsewhere classified, multiple sites 06/07/2013  . Cervical pain (neck) 06/07/2013  . Spasm of muscle 06/07/2013    10:41 AM,11/20/2016 [name] PT, DPT [facility] [place] [address] [place], [place], [address] Phone: [phone]   Fax:  [phone]  Name: Randy [name] MRN: [id number] Date of Birth: [birth year]  *Addendum to resolve episode of care and d/c pt from skilled PT  PHYSICAL THERAPY DISCHARGE SUMMARY  Visits from Start of Care: 11  Current functional level related to goals / functional outcomes: See above for more  details    Remaining deficits: See above for more details    Education / Equipment: See above for more details   Plan: Patient agrees to discharge.  Patient goals were partially met. Patient is  being discharged due to being pleased with the current functional level.  ?????          10:39 AM,04/15/18 Sherol Dade PT, [place] [facility]

## 2016-12-20 ENCOUNTER — Telehealth (HOSPITAL_COMMUNITY): Payer: Self-pay | Admitting: Physical Therapy

## 2016-12-20 NOTE — Telephone Encounter (Signed)
Eber Jones called requested Eval notes from 10/11/2016 to be faxed to [phone]. Done NF

## 2016-12-31 ENCOUNTER — Ambulatory Visit (INDEPENDENT_AMBULATORY_CARE_PROVIDER_SITE_OTHER): Payer: Non-veteran care | Admitting: Allergy & Immunology

## 2016-12-31 ENCOUNTER — Encounter: Payer: Self-pay | Admitting: Allergy & Immunology

## 2016-12-31 VITALS — BP 102/70 | HR 103 | Temp 98.0°F | Resp 18

## 2016-12-31 DIAGNOSIS — J3089 Other allergic rhinitis: Secondary | ICD-10-CM

## 2016-12-31 DIAGNOSIS — L508 Other urticaria: Secondary | ICD-10-CM

## 2016-12-31 DIAGNOSIS — T63481D Toxic effect of venom of other arthropod, accidental (unintentional), subsequent encounter: Secondary | ICD-10-CM | POA: Diagnosis not present

## 2016-12-31 NOTE — Progress Notes (Signed)
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 Chronic urticaria - improved on antihistamines with sensizations to molds, dust mites, cat and dog - Continue with the antihistamines since these seem to be working.   - Morning: Zyrtec (cetirizine) 10-20mg  (one to two tablets)  - Evening: Zyrtec (cetirizine) 10-20mg  (one to two tablets)  - Evening: Zantac (ranitidine)  (two tablets) - You can change this dosing at home, decreasing the dose as needed or increasing the dosing as needed.   2. 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 - We will call you when we have everything in place.    - Process described and consent obtained today.  - EpiPen is up to date.   3. Return in about 6 months (around 06/30/2017).   Subjective:   Keyvin Rison is a 66 y.o. male presenting today for follow up of  Chief Complaint  Patient presents with  . Urticaria  . Allergic Rhinitis     Arad Burston has a history of the following: Patient Active Problem List   Diagnosis Date Noted  . Perennial allergic rhinitis 10/01/2016  . Chronic urticaria 10/01/2016  . Stiffness of joints, not elsewhere classified, multiple sites 06/07/2013  . Cervical pain (neck) 06/07/2013  . Spasm of muscle 06/07/2013    History obtained from: chart review and patient.  Rito Nimmons's Primary Care Provider is Garey Ham, MD.     Dugan is a 66 y.o. male presenting for a follow up visit. He was last seen in June 2018 for his first appointment with me. At that time, he was endorsing a history consistent with chronic  urticaria. He also was reporting some very mild allergic rhinitis symptoms. We did do testing to the environmentals and the most common foods, and this was positive to a few molds, dust mites, cats, and dog. Soy was also mildly reactive. There were no red flags in the history, therefore we did not perform an overly exhaustive workup. However, we did get a CBC, tryptase level, chronic urticaria panel, and a CMP, all of which were normal. We started him on cetirizine 10-20mg  BID as well as ranitidine  at night. He endorsed a history of anaphylaxis to an insect sting in his 59s, therefore we obtained a stinging insect panel which showed positives to white hornet, yellow jacket, paper wasp, and yellow hornet. An EpiPen was sent in and instructions on its use was described.   Since the last visit, he has had hives on two occasions. The first time was mild outbreak and then the second time was on his abdomen and was slightly more severe. He has had no outbreaks in six months. He remains on Zyrtec one tablet in the morning and one tablet in the evening. He is also on ranitidine which he takes at night.   He has had no accidental stings. He does have an EpiPen that is up to date. He keeps one inside of the car and one inside of the house. He does keep Benadryl with him at all times. He does not spend much time outdoors because of his history of insect anaphylaxis,  and he would spend more time if he did not need to worry about this. He had testing that was positive to white hornet, yellow jacket, paper wasp, and yellow hornet.  Reviewing his history of insect stings, it seems that he is truly afraid to spend too much time outdoors, and goes out of his way to change his lifestyle so that he remains indoors. He is interested in getting outside more, and would like to discuss immunotherapy.   Otherwise, there have been no changes to his past medical history, surgical history, family history, or social  history.    Review of Systems: a 14-point review of systems is pertinent for what is mentioned in HPI.  Otherwise, all other systems were negative. Constitutional: negative other than that listed in the HPI Eyes: negative other than that listed in the HPI Ears, nose, mouth, throat, and face: negative other than that listed in the HPI Respiratory: negative other than that listed in the HPI Cardiovascular: negative other than that listed in the HPI Gastrointestinal: negative other than that listed in the HPI Genitourinary: negative other than that listed in the HPI Integument: negative other than that listed in the HPI Hematologic: negative other than that listed in the HPI Musculoskeletal: negative other than that listed in the HPI Neurological: negative other than that listed in the HPI Allergy/Immunologic: negative other than that listed in the HPI    Objective:   Blood pressure 102/70, pulse (!) 103, temperature 98 F (36.7 C), temperature source Oral, resp. rate 18, SpO2 95 %. There is no height or weight on file to calculate BMI.   Physical Exam:  General: Alert, interactive, in no acute distress. Pleasant and very formal.  Eyes: No conjunctival injection present on the right, No conjunctival injection present on the left, PERRL bilaterally, No discharge on the right and No discharge on the left Ears: Right TM pearly gray with normal light reflex, Left TM pearly gray with normal light reflex, Right TM intact without perforation and Left TM intact without perforation.  Nose/Throat: External nose within normal limits and septum midline, turbinates edematous without discharge, post-pharynx mildly erythematous without cobblestoning in the posterior oropharynx. Tonsils 2+ without exudates Neck: Supple without thyromegaly. Lungs: Clear to auscultation without wheezing, rhonchi or rales. No increased work of breathing. CV: Normal S1/S2, no murmurs. Capillary refill <2 seconds.   Skin: Warm and dry, without lesions or rashes. Neuro:   Grossly intact. No focal deficits appreciated. Responsive to questions.   Diagnostic studies: none     Malachi Bonds, MD [facility]

## 2016-12-31 NOTE — Patient Instructions (Addendum)
1. Chronic urticaria - improved on antihistamines with sensizations to molds, dust mites, cat and dog - Continue with the antihistamines since these seem to be working.   - Morning: Zyrtec (cetirizine) 10-20mg  (one to two tablets)  - Evening: Zyrtec (cetirizine) 10-20mg  (one to two tablets)  - Evening: Zantac (ranitidine)  (two tablets) - You can change this dosing at home, decreasing the dose as needed or increasing the dosing as needed.   2. 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 - We will call you when we have everything in place.  - EpiPen is up to date.   3. Return in about 6 months (around 06/30/2017).   Please inform us of any Emergency Department visits, hospitalizations, or changes in symptoms. Call us before going to the ED for breathing or allergy symptoms since we might be able to fit you in for a sick visit. Feel free to contact us anytime with any questions, problems, or concerns.  It was a pleasure to see you again today! Enjoy the upcoming fall season!  Websites that have reliable patient information: 1. American Academy of Asthma, Allergy, and Immunology: www.aaaai.org 2. Food Allergy Research and Education (FARE): foodallergy.org 3. Mothers of Asthmatics: http://www.asthmacommunitynetwork.org 4. American College of Allergy, Asthma, and Immunology: www.acaai.org   Election Day is coming up on Tuesday, November 6th! Make your voice heard! Register to vote at vote.org!

## 2017-01-02 ENCOUNTER — Telehealth: Payer: Self-pay | Admitting: Allergy & Immunology

## 2017-01-02 NOTE — Telephone Encounter (Signed)
-----   Message from Alfonse Spruce, MD sent at 01/01/2017  4:08 PM EDT ----- Here it is!

## 2017-01-02 NOTE — Telephone Encounter (Signed)
Called the [facility] Faxed records requesting an authorization and additional visits and venom injections

## 2017-03-05 NOTE — Telephone Encounter (Signed)
Received Tyler Deisauth form [facility] [id number] 09/16/2016 thru 09/15/2017 OV and Allergy/venom injections

## 2017-07-01 ENCOUNTER — Ambulatory Visit (INDEPENDENT_AMBULATORY_CARE_PROVIDER_SITE_OTHER): Payer: Non-veteran care | Admitting: Allergy & Immunology

## 2017-07-01 ENCOUNTER — Encounter: Payer: Self-pay | Admitting: Allergy & Immunology

## 2017-07-01 VITALS — BP 140/82 | HR 88 | Resp 18

## 2017-07-01 DIAGNOSIS — T63481D Toxic effect of venom of other arthropod, accidental (unintentional), subsequent encounter: Secondary | ICD-10-CM | POA: Diagnosis not present

## 2017-07-01 DIAGNOSIS — T63481A Toxic effect of venom of other arthropod, accidental (unintentional), initial encounter: Secondary | ICD-10-CM | POA: Insufficient documentation

## 2017-07-01 DIAGNOSIS — L508 Other urticaria: Secondary | ICD-10-CM | POA: Diagnosis not present

## 2017-07-01 NOTE — Progress Notes (Signed)
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 Chronic urticaria - improved on antihistamines with sensizations to molds, dust mites, cat and dog - Continue with the antihistamines since these seem to be working.   - Morning: Zyrtec (cetirizine) 10-20mg  (one to two tablets)  - Evening: Zyrtec (cetirizine) 10-20mg  (one to two tablets)  - Evening: Zantac (ranitidine) 300mg  (two tablets) - You can change this dosing at home, decreasing the dose as needed or increasing the dosing as needed.  - We will start triamcinolone 0.1% ointment twice daily as needed for the rash on your chest. - I would recommend trying using a Free and Clear laundry detergent to see if that helps.   2. 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 - Forms signed for venom immunotherapy. - Orders for venom placed and routed to the IT Team.  - EpiPen is up to date.   3. Return in about 6 months (around 01/01/2018).  Subjective:   Bohden Dung is a 67 y.o. male presenting today for follow up of  Chief Complaint  Patient presents with  . Urticaria    since last seen he has had maybe 2 outbreaks, but they were very brief.     Azekiel Cremer has a history of the following: Patient Active Problem List   Diagnosis Date Noted  . Perennial allergic rhinitis 10/01/2016  . Chronic urticaria 10/01/2016  . Stiffness of joints, not elsewhere classified, multiple sites 06/07/2013  . Cervical pain (neck) 06/07/2013  . Spasm of muscle 06/07/2013    History obtained from: chart review and patient.  Zaydrian Pilgrim's Primary  Care Provider is Garey Ham, MD.     Nikolai is a 67 y.o. male presenting for a follow up visit. He was last seen in September 2018. At that time, urticaria was improved with H1 and H2 blockade. She has a history of anaphylaxis to stinging insect allergies with positive lab testing to hornets, yellow jacket, and wasp. He also has a history of allergic rhinitis to dust mites, molds, cat, and dog. We had discussed starting venom immunotherapy, but it does not seem that this was ever done.   Since the last visit, he has done well. He remains on the Zyrtec and Zantac for his hives. He is on cetirizine 10mg  in the morning and occasionally takes one in the evenings. He did have some hives on his abdomen but he is unsure what was causing them. It has not recurred since that time. He was having hives on his his head but this is no longer an issue.   He does report that he is having a rash on her upper chest. He is unsure of the trigger. This has been around for two months. He has not tried using anything for it. He is currently using some type of scented laundry detergent. It is not pruritic.   Otherwise, there have been no changes to his past medical history, surgical history, family history, or social history.    Review of Systems: a 14-point review of systems is pertinent for what is mentioned in HPI.  Otherwise, all other systems were  negative. Constitutional: negative other than that listed in the HPI Eyes: negative other than that listed in the HPI Ears, nose, mouth, throat, and face: negative other than that listed in the HPI Respiratory: negative other than that listed in the HPI Cardiovascular: negative other than that listed in the HPI Gastrointestinal: negative other than that listed in the HPI Genitourinary: negative other than that listed in the HPI Integument: negative other than that listed in the HPI Hematologic: negative other than that listed in the HPI Musculoskeletal: negative  other than that listed in the HPI Neurological: negative other than that listed in the HPI Allergy/Immunologic: negative other than that listed in the HPI    Objective:   Blood pressure 140/82, pulse 88, resp. rate 18, SpO2 98 %. There is no height or weight on file to calculate BMI.   Physical Exam:  General: Alert, interactive, in no acute distress. Pleasant male. Sounds like Fleet ContrasJames Earl Jones.  Eyes: No conjunctival injection bilaterally, no discharge on the right, no discharge on the left and no Horner-Trantas dots present. PERRL bilaterally. EOMI without pain. No photophobia.  Ears: Right TM pearly gray with normal light reflex, Left TM pearly gray with normal light reflex, Right TM intact without perforation and Left TM intact without perforation.  Nose/Throat: External nose within normal limits and septum midline. Turbinates edematous and pale with clear discharge. Posterior oropharynx erythematous without cobblestoning in the posterior oropharynx. Tonsils 2+ without exudates.  Tongue without thrush. Lungs: Clear to auscultation without wheezing, rhonchi or rales. No increased work of breathing. CV: Normal S1/S2. No murmurs. Capillary refill <2 seconds.  Skin: Dry, erythematous, excoriated patches on the upper anterior chest with small papules. Neuro:   Grossly intact. No focal deficits appreciated. Responsive to questions.  Diagnostic studies: none      Malachi BondsJoel Parisa Pinela, MD [facility]

## 2017-07-01 NOTE — Patient Instructions (Addendum)
1. Chronic urticaria - improved on antihistamines with sensizations to molds, dust mites, cat and dog - Continue with the antihistamines since these seem to be working.   - Morning: Zyrtec (cetirizine) 10-20mg  (one to two tablets)  - Evening: Zyrtec (cetirizine) 10-20mg  (one to two tablets)  - Evening: Zantac (ranitidine) 300mg  (two tablets) - You can change this dosing at home, decreasing the dose as needed or increasing the dosing as needed.  - We will start triamcinolone 0.1% ointment twice daily as needed for the rash on your chest. - I would recommend trying using a Free and Clear laundry detergent to see if that helps.   2. 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 - EpiPen is up to date.   3. Return in about 6 months (around 01/01/2018).   Please inform us of any Emergency Department visits, hospitalizations, or changes in symptoms. Call us before going to the ED for breathing or allergy symptoms since we might be able to fit you in for a sick visit. Feel free to contact us anytime with any questions, problems, or concerns.  It was a pleasure to see you again today!  Websites that have reliable patient information: 1. American Academy of Asthma, Allergy, and Immunology: www.aaaai.org 2. Food Allergy Research and Education (FARE): foodallergy.org 3. Mothers of Asthmatics: http://www.asthmacommunitynetwork.org 4. American College of Allergy, Asthma, and Immunology: www.acaai.org   Allergy Shots   Allergies are the result of a chain reaction that starts in the immune system. Your immune system controls how your body defends itself. For instance, if you have an allergy to pollen, your immune system identifies pollen as an invader or allergen. Your immune system overreacts by producing antibodies called Immunoglobulin E (IgE). These antibodies travel to cells  that release chemicals, causing an allergic reaction.  The concept behind allergy immunotherapy, whether it is received in the form of shots or tablets, is that the immune system can be desensitized to specific allergens that trigger allergy symptoms. Although it requires time and patience, the payback can be long-term relief.  How Do Allergy Shots Work?  Allergy shots work much like a vaccine. Your body responds to injected amounts of a particular allergen given in increasing doses, eventually developing a resistance and tolerance to it. Allergy shots can lead to decreased, minimal or no allergy symptoms.  There generally are two phases: build-up and maintenance. Build-up often ranges from three to six months and involves receiving injections with increasing amounts of the allergens. The shots are typically given once or twice a week, though more rapid build-up schedules are sometimes used.  The maintenance phase begins when the most effective dose is reached. This dose is different for each person, depending on how allergic you are and your response to the build-up injections. Once the maintenance dose is reached, there are longer periods between injections, typically two to four weeks.  Occasionally doctors give cortisone-type shots that can temporarily reduce allergy symptoms. These types of shots are different and should not be confused with allergy immunotherapy shots.  Who Can Be Treated with Allergy Shots?  Allergy shots may be a good treatment approach for people with allergic rhinitis (hay fever), allergic asthma, conjunctivitis (eye allergy) or stinging insect allergy.   Before deciding to begin allergy shots, you should consider:  . The length of allergy season and the severity of your symptoms . Whether  medications and/or changes to your environment can control your symptoms . Your desire to avoid long-term medication use . Time: allergy immunotherapy requires a major time  commitment . Cost: may vary depending on your insurance coverage  Allergy shots for children age 29 and older are effective and often well tolerated. They might prevent the onset of new allergen sensitivities or the progression to asthma.  Allergy shots are not started on patients who are pregnant but can be continued on patients who become pregnant while receiving them. In some patients with other medical conditions or who take certain common medications, allergy shots may be of risk. It is important to mention other medications you talk to your allergist.   When Will I Feel Better?  Some may experience decreased allergy symptoms during the build-up phase. For others, it may take as long as 12 months on the maintenance dose. If there is no improvement after a year of maintenance, your allergist will discuss other treatment options with you.  If you aren't responding to allergy shots, it may be because there is not enough dose of the allergen in your vaccine or there are missing allergens that were not identified during your allergy testing. Other reasons could be that there are high levels of the allergen in your environment or major exposure to non-allergic triggers like tobacco smoke.  What Is the Length of Treatment?  Once the maintenance dose is reached, allergy shots are generally continued for three to five years. The decision to stop should be discussed with your allergist at that time. Some people may experience a permanent reduction of allergy symptoms. Others may relapse and a longer course of allergy shots can be considered.  What Are the Possible Reactions?  The two types of adverse reactions that can occur with allergy shots are local and systemic. Common local reactions include very mild redness and swelling at the injection site, which can happen immediately or several hours after. A systemic reaction, which is less common, affects the entire body or a particular body system.  They are usually mild and typically respond quickly to medications. Signs include increased allergy symptoms such as sneezing, a stuffy nose or hives.  Rarely, a serious systemic reaction called anaphylaxis can develop. Symptoms include swelling in the throat, wheezing, a feeling of tightness in the chest, nausea or dizziness. Most serious systemic reactions develop within 30 minutes of allergy shots. This is why it is strongly recommended you wait in your doctor's office for 30 minutes after your injections. Your allergist is trained to watch for reactions, and his or her staff is trained and equipped with the proper medications to identify and treat them.  Who Should Administer Allergy Shots?  The preferred location for receiving shots is your prescribing allergist's office. Injections can sometimes be given at another facility where the physician and staff are trained to recognize and treat reactions, and have received instructions by your prescribing allergist.

## 2017-07-15 ENCOUNTER — Ambulatory Visit: Payer: Non-veteran care

## 2017-08-26 ENCOUNTER — Ambulatory Visit: Payer: Non-veteran care

## 2017-09-04 ENCOUNTER — Telehealth: Payer: Self-pay

## 2017-09-04 NOTE — Telephone Encounter (Signed)
New request has been faxed to [facility]  Exp 09/15/2017

## 2017-09-09 ENCOUNTER — Ambulatory Visit: Payer: Non-veteran care

## 2017-09-23 ENCOUNTER — Ambulatory Visit (INDEPENDENT_AMBULATORY_CARE_PROVIDER_SITE_OTHER): Payer: PRIVATE HEALTH INSURANCE | Admitting: *Deleted

## 2017-09-23 DIAGNOSIS — Z91030 Bee allergy status: Secondary | ICD-10-CM

## 2017-09-23 DIAGNOSIS — T63481D Toxic effect of venom of other arthropod, accidental (unintentional), subsequent encounter: Secondary | ICD-10-CM

## 2017-09-23 DIAGNOSIS — Z91038 Other insect allergy status: Secondary | ICD-10-CM

## 2017-09-24 NOTE — Progress Notes (Signed)
Immunotherapy   Patient Details  Name: Arlester MarkerJohn Hermida MRN: [id number] Date of Birth: Sep 21, 1950  09/23/2017  Patient started venom injections.  Patient will be coming weekly for buildup of Mixed Vespid and Wasp.  Patient received 0.05 cc of Mixed Vespid .003 and Wasp .001.    Patient does have Epi-Pen and has been instructed on proper use.  Consent signed and patient instructions given.  Patient waited 30 minutes after injections and no issues.     Shelba FlakeElizabeth Azaiah Licciardi 09/24/2017, 10:14 AM

## 2017-09-30 ENCOUNTER — Ambulatory Visit (INDEPENDENT_AMBULATORY_CARE_PROVIDER_SITE_OTHER): Payer: PRIVATE HEALTH INSURANCE

## 2017-09-30 DIAGNOSIS — T63481D Toxic effect of venom of other arthropod, accidental (unintentional), subsequent encounter: Secondary | ICD-10-CM | POA: Diagnosis not present

## 2017-10-07 ENCOUNTER — Ambulatory Visit (INDEPENDENT_AMBULATORY_CARE_PROVIDER_SITE_OTHER): Payer: PRIVATE HEALTH INSURANCE | Admitting: *Deleted

## 2017-10-07 DIAGNOSIS — Z91038 Other insect allergy status: Secondary | ICD-10-CM

## 2017-10-07 DIAGNOSIS — Z91030 Bee allergy status: Secondary | ICD-10-CM

## 2017-10-14 ENCOUNTER — Ambulatory Visit (INDEPENDENT_AMBULATORY_CARE_PROVIDER_SITE_OTHER): Payer: PRIVATE HEALTH INSURANCE

## 2017-10-14 DIAGNOSIS — T63481D Toxic effect of venom of other arthropod, accidental (unintentional), subsequent encounter: Secondary | ICD-10-CM | POA: Diagnosis not present

## 2017-10-21 ENCOUNTER — Ambulatory Visit (INDEPENDENT_AMBULATORY_CARE_PROVIDER_SITE_OTHER): Payer: PRIVATE HEALTH INSURANCE

## 2017-10-21 DIAGNOSIS — T63481D Toxic effect of venom of other arthropod, accidental (unintentional), subsequent encounter: Secondary | ICD-10-CM | POA: Diagnosis not present

## 2017-10-28 ENCOUNTER — Ambulatory Visit (INDEPENDENT_AMBULATORY_CARE_PROVIDER_SITE_OTHER): Payer: PRIVATE HEALTH INSURANCE | Admitting: *Deleted

## 2017-10-28 DIAGNOSIS — Z91030 Bee allergy status: Secondary | ICD-10-CM

## 2017-10-28 DIAGNOSIS — Z91038 Other insect allergy status: Secondary | ICD-10-CM

## 2017-11-04 ENCOUNTER — Ambulatory Visit (INDEPENDENT_AMBULATORY_CARE_PROVIDER_SITE_OTHER): Payer: PRIVATE HEALTH INSURANCE

## 2017-11-04 DIAGNOSIS — Z91038 Other insect allergy status: Secondary | ICD-10-CM

## 2017-11-04 DIAGNOSIS — Z91030 Bee allergy status: Secondary | ICD-10-CM

## 2017-11-11 ENCOUNTER — Ambulatory Visit: Payer: Self-pay

## 2017-11-18 ENCOUNTER — Ambulatory Visit (INDEPENDENT_AMBULATORY_CARE_PROVIDER_SITE_OTHER): Payer: PRIVATE HEALTH INSURANCE | Admitting: *Deleted

## 2017-11-18 DIAGNOSIS — Z91038 Other insect allergy status: Secondary | ICD-10-CM

## 2017-11-18 DIAGNOSIS — Z91030 Bee allergy status: Secondary | ICD-10-CM | POA: Diagnosis not present

## 2017-11-25 ENCOUNTER — Ambulatory Visit: Payer: PRIVATE HEALTH INSURANCE

## 2017-12-02 ENCOUNTER — Ambulatory Visit (INDEPENDENT_AMBULATORY_CARE_PROVIDER_SITE_OTHER): Payer: PRIVATE HEALTH INSURANCE | Admitting: *Deleted

## 2017-12-02 DIAGNOSIS — Z91038 Other insect allergy status: Secondary | ICD-10-CM

## 2017-12-02 DIAGNOSIS — Z91030 Bee allergy status: Secondary | ICD-10-CM | POA: Diagnosis not present

## 2017-12-09 ENCOUNTER — Ambulatory Visit (INDEPENDENT_AMBULATORY_CARE_PROVIDER_SITE_OTHER): Payer: PRIVATE HEALTH INSURANCE

## 2017-12-09 DIAGNOSIS — Z91038 Other insect allergy status: Secondary | ICD-10-CM

## 2017-12-09 DIAGNOSIS — Z91030 Bee allergy status: Secondary | ICD-10-CM

## 2017-12-16 ENCOUNTER — Ambulatory Visit (INDEPENDENT_AMBULATORY_CARE_PROVIDER_SITE_OTHER): Payer: PRIVATE HEALTH INSURANCE | Admitting: *Deleted

## 2017-12-16 DIAGNOSIS — Z91030 Bee allergy status: Secondary | ICD-10-CM | POA: Diagnosis not present

## 2017-12-16 DIAGNOSIS — Z91038 Other insect allergy status: Secondary | ICD-10-CM

## 2017-12-23 ENCOUNTER — Ambulatory Visit: Payer: Self-pay | Admitting: *Deleted

## 2017-12-23 ENCOUNTER — Ambulatory Visit: Payer: PRIVATE HEALTH INSURANCE | Admitting: *Deleted

## 2017-12-23 DIAGNOSIS — Z91030 Bee allergy status: Secondary | ICD-10-CM

## 2017-12-23 DIAGNOSIS — Z91038 Other insect allergy status: Secondary | ICD-10-CM

## 2017-12-30 ENCOUNTER — Ambulatory Visit (INDEPENDENT_AMBULATORY_CARE_PROVIDER_SITE_OTHER): Payer: PRIVATE HEALTH INSURANCE

## 2017-12-30 DIAGNOSIS — Z91030 Bee allergy status: Secondary | ICD-10-CM

## 2017-12-30 DIAGNOSIS — Z91038 Other insect allergy status: Secondary | ICD-10-CM

## 2018-01-06 ENCOUNTER — Ambulatory Visit: Payer: Self-pay

## 2018-01-06 ENCOUNTER — Ambulatory Visit: Payer: Non-veteran care | Admitting: Allergy & Immunology

## 2018-01-23 ENCOUNTER — Encounter: Payer: Self-pay | Admitting: Allergy & Immunology

## 2018-01-23 ENCOUNTER — Ambulatory Visit: Payer: Self-pay

## 2018-01-23 ENCOUNTER — Ambulatory Visit (INDEPENDENT_AMBULATORY_CARE_PROVIDER_SITE_OTHER): Payer: PRIVATE HEALTH INSURANCE | Admitting: Allergy & Immunology

## 2018-01-23 VITALS — BP 112/68 | HR 74 | Temp 97.9°F | Resp 20 | Ht 68.75 in | Wt 209.8 lb

## 2018-01-23 DIAGNOSIS — J3089 Other allergic rhinitis: Secondary | ICD-10-CM

## 2018-01-23 DIAGNOSIS — L508 Other urticaria: Secondary | ICD-10-CM

## 2018-01-23 DIAGNOSIS — Z91030 Bee allergy status: Secondary | ICD-10-CM

## 2018-01-23 DIAGNOSIS — Z91038 Other insect allergy status: Secondary | ICD-10-CM

## 2018-01-23 NOTE — Progress Notes (Signed)
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 Overall, he is doing very well on the venom immunotherapy.  His chronic urticaria have improved with suppressive doses of antihistamines and he has been able to wean to a very low level of antihistamines.  He does have the dust mite, mold, cat, and dog allergy, which could have been contributing to his chronic urticaria.  He has instituted dust mite avoidance measures in the house, which is likely contributing to the improvement in the hives as well.  His venom immunotherapy is going well.  We did discuss the natural course of venom immunotherapy and I emphasized that this would provide protection to 2-3 stings.  However, he still needs to keep his EpiPen with him at all times just in case of a worsening reaction.  Plan/Recommendations:   1. Chronic urticaria - improved on antihistamines with sensizations to molds, dust mites, cat and dog - Continue with the cetirizine and ranitidine as you are doing. - I am glad that you have been able to decrease these over time.   2. 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  - EpiPen is up to date.   3. Return in about 1 year (around 01/24/2019).   Subjective:   Randy [name] is a 67 y.o. male presenting today for follow up of  Chief Complaint  Patient presents with  . Follow-up    Randy [name] has a history of the following: Patient Active Problem List   Diagnosis Date Noted  . 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 Perennial allergic  rhinitis 10/01/2016  . Chronic urticaria 10/01/2016  . Stiffness of joints, not elsewhere classified, multiple sites 06/07/2013  . Cervical pain (neck) 06/07/2013  . Spasm of muscle 06/07/2013    History obtained from: chart review and patient.  Randy [name]'s Primary Care Provider is Randy [name], Randy [name].     Randy [name] is a 67 y.o. male presenting for a follow up visit.  He was last seen in March 2019.  At that time, we continued with his antihistamines for suppression of his urticaria.  He has a history of sensitizations to molds, dust mites, cat, and dog.  We also started triamcinolone 0.1% ointment twice daily as needed.  He has a history of venom hypersensitivity and he made the decision to start venom immunotherapy.  We did obtain coverage from the [place] and he has since started this without any adverse event.  Since the last visit, he has done well. He remains on cetirizine 10mg  in the morning and often time he does not take the night time dosing at all. He does not use the ranitidine every day. He has had two episodes of urticaria around his belly button and on his arm. The rash on the chest impropved from the last visit and he changed to the Free and Clear detergent.  He remains on venom immunotherapy. He does have an EpiPen available at all times.  He has had no reactions to the shots.  He has not been stung since the immunotherapy has begun.  He did  have some recent botox therapy for an ocular condition. This was done in [place].  He feels that this is helping.  He will get another injection in January and then regularly thereafter until he is clinically resolved.  Otherwise, there have been no changes to his past medical history, surgical history, family history, or social history.    Review of Systems: a 14-point review of systems is pertinent for what is mentioned in HPI.  Otherwise, all other systems were negative.  Constitutional: negative other than that listed in the HPI Eyes:  negative other than that listed in the HPI Ears, nose, mouth, throat, and face: negative other than that listed in the HPI Respiratory: negative other than that listed in the HPI Cardiovascular: negative other than that listed in the HPI Gastrointestinal: negative other than that listed in the HPI Genitourinary: negative other than that listed in the HPI Integument: negative other than that listed in the HPI Hematologic: negative other than that listed in the HPI Musculoskeletal: negative other than that listed in the HPI Neurological: negative other than that listed in the HPI Allergy/Immunologic: negative other than that listed in the HPI    Objective:   Blood pressure 112/68, pulse 74, temperature 97.9 F (36.6 C), temperature source Oral, resp. rate 20, height 5' 8.75" (1.746 m), weight 209 lb 12.8 oz (95.2 kg), SpO2 98 %. Body mass index is 31.21 kg/m.   Physical Exam:  General: Alert, interactive, in no acute distress.  Very talkative male. Eyes: Droopy eyes bilaterally, No conjunctival injection bilaterally, no discharge on the right, no discharge on the left and no Horner-Trantas dots present. PERRL bilaterally. EOMI without pain. No photophobia.  Ears: Right TM pearly gray with normal light reflex, Left TM pearly gray with normal light reflex, Right TM intact without perforation and Left TM intact without perforation.  Nose/Throat: External nose within normal limits and septum midline. Turbinates edematous and pale with clear discharge. Posterior oropharynx erythematous without cobblestoning in the posterior oropharynx. Tonsils 2+ without exudates.  Tongue without thrush. Lungs: Clear to auscultation without wheezing, rhonchi or rales. No increased work of breathing. CV: Normal S1/S2. No murmurs. Capillary refill <2 seconds.  Skin: Warm and dry, without lesions or rashes. Neuro:   Grossly intact. No focal deficits appreciated. Responsive to questions.  Diagnostic studies:  none    Randy [name], Randy [name]  [facility]

## 2018-01-23 NOTE — Patient Instructions (Addendum)
1. Chronic urticaria - improved on antihistamines with sensizations to molds, dust mites, cat and dog - Continue with the cetirizine and ranitidine as you are doing. - I am glad that you have been able to decrease these over time.   2. 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  - EpiPen is up to date.   3. Return in about 1 year (around 01/24/2019).    Please inform us of any Emergency Department visits, hospitalizations, or changes in symptoms. Call us before going to the ED for breathing or allergy symptoms since we might be able to fit you in for a sick visit. Feel free to contact us anytime with any questions, problems, or concerns.  It was a pleasure to see you again today!  Websites that have reliable patient information: 1. American Academy of Asthma, Allergy, and Immunology: www.aaaai.org 2. Food Allergy Research and Education (FARE): foodallergy.org 3. Mothers of Asthmatics: http://www.asthmacommunitynetwork.org 4. American College of Allergy, Asthma, and Immunology: MissingWeapons.ca   Make sure you are registered to vote! If you have moved or changed any of your contact information, you will need to get this updated before voting!

## 2018-01-30 ENCOUNTER — Ambulatory Visit (INDEPENDENT_AMBULATORY_CARE_PROVIDER_SITE_OTHER): Payer: PRIVATE HEALTH INSURANCE | Admitting: *Deleted

## 2018-01-30 DIAGNOSIS — Z91030 Bee allergy status: Secondary | ICD-10-CM

## 2018-01-30 DIAGNOSIS — Z91038 Other insect allergy status: Secondary | ICD-10-CM

## 2018-02-06 ENCOUNTER — Ambulatory Visit (INDEPENDENT_AMBULATORY_CARE_PROVIDER_SITE_OTHER): Payer: PRIVATE HEALTH INSURANCE

## 2018-02-06 DIAGNOSIS — Z91030 Bee allergy status: Secondary | ICD-10-CM | POA: Diagnosis not present

## 2018-02-06 DIAGNOSIS — Z91038 Other insect allergy status: Secondary | ICD-10-CM

## 2018-02-11 ENCOUNTER — Ambulatory Visit (INDEPENDENT_AMBULATORY_CARE_PROVIDER_SITE_OTHER): Payer: PRIVATE HEALTH INSURANCE

## 2018-02-11 DIAGNOSIS — Z91030 Bee allergy status: Secondary | ICD-10-CM | POA: Diagnosis not present

## 2018-02-11 DIAGNOSIS — Z91038 Other insect allergy status: Secondary | ICD-10-CM

## 2018-02-18 ENCOUNTER — Ambulatory Visit (INDEPENDENT_AMBULATORY_CARE_PROVIDER_SITE_OTHER): Payer: PRIVATE HEALTH INSURANCE | Admitting: *Deleted

## 2018-02-18 DIAGNOSIS — Z91030 Bee allergy status: Secondary | ICD-10-CM | POA: Diagnosis not present

## 2018-02-18 DIAGNOSIS — Z91038 Other insect allergy status: Secondary | ICD-10-CM

## 2018-02-25 ENCOUNTER — Ambulatory Visit (INDEPENDENT_AMBULATORY_CARE_PROVIDER_SITE_OTHER): Payer: PRIVATE HEALTH INSURANCE | Admitting: *Deleted

## 2018-02-25 DIAGNOSIS — Z91030 Bee allergy status: Secondary | ICD-10-CM

## 2018-02-25 DIAGNOSIS — Z91038 Other insect allergy status: Secondary | ICD-10-CM

## 2018-03-04 ENCOUNTER — Ambulatory Visit (INDEPENDENT_AMBULATORY_CARE_PROVIDER_SITE_OTHER): Payer: PRIVATE HEALTH INSURANCE

## 2018-03-04 DIAGNOSIS — Z91030 Bee allergy status: Secondary | ICD-10-CM | POA: Diagnosis not present

## 2018-03-04 DIAGNOSIS — Z91038 Other insect allergy status: Secondary | ICD-10-CM

## 2018-03-13 ENCOUNTER — Ambulatory Visit (INDEPENDENT_AMBULATORY_CARE_PROVIDER_SITE_OTHER): Payer: PRIVATE HEALTH INSURANCE | Admitting: *Deleted

## 2018-03-13 DIAGNOSIS — Z91038 Other insect allergy status: Secondary | ICD-10-CM

## 2018-03-13 DIAGNOSIS — Z91030 Bee allergy status: Secondary | ICD-10-CM

## 2018-03-20 ENCOUNTER — Ambulatory Visit (INDEPENDENT_AMBULATORY_CARE_PROVIDER_SITE_OTHER): Payer: PRIVATE HEALTH INSURANCE

## 2018-03-20 DIAGNOSIS — Z91030 Bee allergy status: Secondary | ICD-10-CM

## 2018-03-20 DIAGNOSIS — Z91038 Other insect allergy status: Secondary | ICD-10-CM

## 2018-03-27 ENCOUNTER — Ambulatory Visit: Payer: Self-pay

## 2018-03-27 NOTE — Progress Notes (Signed)
Randy [name] was seen today in the movement disorders clinic for neurologic consultation at the request of Randy Carbon, MD.  The consultation is for the evaluation of essential tremor.  I have reviewed numerous records on the patient.  The patient saw [name] on November 21.  He had previously seen the patient in 2015 and did a cervical laminectomy on the patient.  The patient followed up with [name] to see why he was not ambulating well, but [name] did not think that it was related to his prior surgery and recommended further neurology follow-up.  Pt denies any trouble with ambulation today.   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  He had previously been seen at a different [facility] for the same and had been receiving Botox.  Despite that, the patient reports that before and after the Botox, he had difficulty opening his eyelids.    Pt indicates that eyelid issue started in 08/2017.  He states that his cousin has the same thing and his mom did as well.  He states that the eyes are super sensitive to light and they want to stay closed.  He isn't sure that the botox has been helpful.  He states that he doesn't know the dose of botox he received but he does know he received 1 injection on the upper lid and one on the lower lid.  It sounds like he had ptosis on the right with the last injections that has now resolved.  He is due for his third botox injections on  January 14.  He is planning on changing care from the [facility] [place] to the [facility] medical center, but the wait was so long that he requested appt here.  He admits he has trouble driving because of the eye closure.  He cannot state if eyes are more open after a nap/sleep.  The R eye is worse than the left.    Tremor: Yes.    Pt denies but states that others have mentioned that to him but doesn't bother him.    Other Specific Symptoms:  Voice:  has a strong voice Sleep: trouble staying asleep - easily aroused  Vivid Dreams: some  Acting out dreams: occasionally Postural symptoms:  Yes.  , some if gets up too quickly  Falls?  One time Loss of smell:  No. Loss of taste:  No. Urinary Incontinence:  No., has urinary urgency Difficulty Swallowing:  No., except for the bee allergy and receives injections for that Depression:  No., "but I'm a perfectionist in an imperfect world." Memory changes:  No. Hallucinations:  No. Diplopia:  No. Dyskinesia:  No.  Neuroimaging of the brain has previously been performed per patient.  It is not available for my review today.   ALLERGIES:   Allergies  Allergen Reactions  . Hornet Venom Anaphylaxis  . Wasp Venom Anaphylaxis  . Yellow Jacket Venom [Bee Venom] Anaphylaxis    CURRENT MEDICATIONS:  Outpatient Encounter Medications as of 04/06/2018  Medication Sig  . aspirin EC 81 MG tablet Take 81 mg by mouth daily.  Marland Kitchen atorvastatin (LIPITOR) 80 MG tablet Take 40 mg by mouth daily.   . cetirizine (ZYRTEC) 10 MG tablet Take 2 tablets (20 mg total) by mouth 2 (two) times daily.  Marland Kitchen EPINEPHrine (EPIPEN 2-PAK) 0.3 mg/0.3 mL IJ SOAJ injection Inject 0.3 mg into the muscle once.  . lidocaine (  XYLOCAINE) 5 % ointment Apply 1 application topically as needed.  Marland Kitchen lisinopril (PRINIVIL,ZESTRIL) 40 MG tablet Take 20 mg by mouth daily.   . naproxen sodium (ANAPROX) 275 MG tablet Take 275 mg by mouth 2 (two) times daily with a meal.  . omeprazole (PRILOSEC) 20 MG capsule Take 20 mg by mouth daily.  . ranitidine (ZANTAC) 300 MG tablet Take 1 tablet (300 mg total) by mouth at bedtime.  . sildenafil (VIAGRA) 100 MG tablet Take 100 mg by mouth daily as needed for erectile dysfunction.  Marland Kitchen terazosin (HYTRIN) 2 MG capsule Take 4 mg by mouth at bedtime.    No facility-administered encounter medications on file as of 04/06/2018.     PAST MEDICAL HISTORY:   Past Medical History:  Diagnosis Date  . Angio-edema     . Urticaria     PAST SURGICAL HISTORY:   Past Surgical History:  Procedure Laterality Date  . NECK SURGERY  2015  . TONSILLECTOMY      SOCIAL HISTORY:   Social History   Socioeconomic History  . Marital status: Divorced    Spouse name: Not on file  . Number of children: Not on file  . Years of education: Not on file  . Highest education level: Not on file  Occupational History  . Not on file  Social Needs  . Financial resource strain: Not on file  . Food insecurity:    Worry: Not on file    Inability: Not on file  . Transportation needs:    Medical: Not on file    Non-medical: Not on file  Tobacco Use  . Smoking status: Current Some Day Smoker  . Smokeless tobacco: Never Used  Substance and Sexual Activity  . Alcohol use: No  . Drug use: No  . Sexual activity: Not on file  Lifestyle  . Physical activity:    Days per week: Not on file    Minutes per session: Not on file  . Stress: Not on file  Relationships  . Social connections:    Talks on phone: Not on file    Gets together: Not on file    Attends religious service: Not on file    Active member of club or organization: Not on file    Attends meetings of clubs or organizations: Not on file    Relationship status: Not on file  . Intimate partner violence:    Fear of current or ex partner: Not on file    Emotionally abused: Not on file    Physically abused: Not on file    Forced sexual activity: Not on file  Other Topics Concern  . Not on file  Social History Narrative  . Not on file    FAMILY HISTORY:   Family Status  Relation Name Status  . Mother  Alive  . Father  Deceased  . Sister x4 Alive  . Son Civil engineer, contracting  . Daughter x5 Alive  . Neg Hx  (Not Specified)    ROS:  Review of Systems  Constitutional: Negative.   HENT:       Rhinorrhea  Eyes: Positive for blurred vision.  Respiratory: Positive for cough.   Cardiovascular: Negative.   Gastrointestinal: Negative.   Genitourinary: Positive  for urgency.  Musculoskeletal: Positive for neck pain.  Skin: Negative.   Neurological: Positive for tingling (fingers, since neck sx).    PHYSICAL EXAMINATION:    VITALS:   Vitals:   04/06/18 1011  BP: 102/68  Pulse: Marland Kitchen)  114  SpO2: 94%  Weight: 217 lb (98.4 kg)  Height: 5\' 9"  (1.753 m)    GEN:  The patient appears stated age and is in NAD.  He is verbose and has difficulty staying on topic. HEENT:  Normocephalic, atraumatic.  The mucous membranes are moist. The superficial temporal arteries are without ropiness or tenderness. CV:  RRR Lungs:  CTAB Neck/HEME:  There are no carotid bruits bilaterally.  Neck is slightly pulled to the left and there is an intermittent, irregular head bobbing.  Neurological examination:  Orientation: The patient is alert and oriented x3. Fund of knowledge is appropriate.  Recent and remote memory are intact.  Attention and concentration are normal.    Able to name objects and repeat phrases. Cranial nerves: There is good facial symmetry. Pupils are equal round and reactive to light bilaterally. Fundoscopic exam is attempted but the disc margins are not well visualized bilaterally. Extraocular muscles are intact.  Lids are closed much of the time.  With attempts at opening the lids, he has spasm of the eyelids bilaterally.  The visual fields are full to confrontational testing. The speech is fluent and clear. Soft palate rises symmetrically and there is no tongue deviation. Hearing is intact to conversational tone. Sensation: Sensation is intact to light touch throughout.. Vibration is intact at the bilateral big toe on the left and intact at the ankle on the right. There is no extinction with double simultaneous stimulation. There is no sensory dermatomal level identified. Motor: Strength is 5/5 in the bilateral upper and lower extremities.   Shoulder shrug is equal and symmetric.  There is no pronator drift. Deep tendon reflexes: Deep tendon reflexes are  2/4 at the bilateral biceps, triceps, brachioradialis, 3/4 at the bilateral patella and 2/4 at the bilateral Achilles. Plantar responses are downgoing bilaterally.  Movement examination: Tone: There is normal tone in the bilateral upper extremities.  The tone in the lower extremities is normal.  Abnormal movements: There is no rest tremor.  There is very little postural tremor noted bilaterally.  There is no intention tremor.  There is an irregular head tremor, as described above. Coordination:  There is no decremation with RAM's, with any form of RAMS, including alternating supination and pronation of the forearm, hand opening and closing, finger taps, heel taps and toe taps. Gait and Station: The patient has no difficulty arising out of a deep-seated chair without the use of the hands. The patient's stride length is normal with good arm swing bilaterally.   ASSESSMENT/PLAN:  1.  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  The head/neck does not seem to be particularly bothersome to him.  I did tell him that we can always try Botox in the neck if it becomes more bothersome.  He asked me about oral medications and stated that Dr. Mayford Knifeurner told him we could use some oral medications.  I told him that he does not have essential tremor (which was referral diagnosis) and that I do not think oral medications will help.  -He has been receiving [facility].  I do not know that he type of Botox, nor do I know the dosing.  I will try to get that information, but traditionally it has been very difficult to get information from the [place], especially medical records.  I asked the patient to assist with this.  He does know that he has been getting 1 injection in the upper eyelid  and 1 injection in the lower eyelid.  I suspect that he will need medial and lateral injections to be more effective.  He has had some eyelid droop in the past and we will need to be careful  about dosing.  He and I thoroughly discussed risk, benefits, and side effects.  -Discussed with the patient that currently his blepharospasm is quite severe with eyelid closure most of the time.  He is not to be driving.  -I did ask the patient to get me a copy of the MRI of the brain and cervical spine from the [facility].  He has recently seen neurosurgery through the [facility] (Dr. Mayford Knifeurner) so I am not particularly worried about any new lesions there, as Dr. Mayford Knifeurner did not feel that this was causing his issue.  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  2.  Much greater than 50% of this 60-minute visit was spent in counseling with the patient concerning questions and discussing treatment options.  Cc:  Garey Hamassada, Peggy, MD

## 2018-04-06 ENCOUNTER — Encounter: Payer: Self-pay | Admitting: Neurology

## 2018-04-06 ENCOUNTER — Ambulatory Visit (INDEPENDENT_AMBULATORY_CARE_PROVIDER_SITE_OTHER): Payer: No Typology Code available for payment source | Admitting: Neurology

## 2018-04-06 VITALS — BP 102/68 | HR 114 | Ht 69.0 in | Wt 217.0 lb

## 2018-04-06 DIAGNOSIS — G243 Spasmodic torticollis: Secondary | ICD-10-CM

## 2018-04-06 DIAGNOSIS — G245 Blepharospasm: Secondary | ICD-10-CM

## 2018-04-06 NOTE — Patient Instructions (Addendum)
1.  Please request a copy of your MRI brain CD from the [place] 2.  We will try to get approval from the [place] to do the botox for your blepharospasm.  (We have you scheduled on January 31) 3. Please bring us Ophthalmology notes from the [place].

## 2018-04-10 ENCOUNTER — Ambulatory Visit: Payer: Self-pay

## 2018-04-16 ENCOUNTER — Encounter: Payer: Self-pay | Admitting: *Deleted

## 2018-04-16 NOTE — Progress Notes (Unsigned)
Called [facility] [phone] spoke with Delice Bison she said J [id number] was included in pre-auth [id number]. It was not listed as several aren't but they follow medicare guielines. She directed me to website [email or web address] >providers>quick reference guide> auth letter> provider auth codes . I did not see it listed so she directed me to request the [phone] code authorization via website Provider> Find a form> request for services fax# [phone] The authorization to use is #[id number] which is listed on correspondence received, not the auth [id number] because that code is for the Pre-appointment request.

## 2018-04-17 ENCOUNTER — Ambulatory Visit: Payer: Self-pay

## 2018-04-28 ENCOUNTER — Telehealth: Payer: Self-pay | Admitting: Neurology

## 2018-04-28 NOTE — Telephone Encounter (Signed)
Patient states that Dr Tat needs his CD from the [place] of his MRI before she does his appt on 05-08-18. He states that he has filled out all of the paper work for the [place] but they need [place] to request everything that she would need in witting and they will over night it to [place]. He states that she needed the eye records and the CD of the MRI. He states you can send the request to Fax Number [phone] and the last four of his SS is [id number].

## 2018-04-28 NOTE — Telephone Encounter (Signed)
Request faxed to number provided with confirmation received.

## 2018-05-04 ENCOUNTER — Telehealth: Payer: Self-pay | Admitting: Neurology

## 2018-05-04 NOTE — Telephone Encounter (Signed)
Patient called needing to make sure that his information from the [place] had been received at our office? He mentioned an MRI and scans.  He is scheduled for Friday 05/08/18. Please Advise. Thank you

## 2018-05-04 NOTE — Telephone Encounter (Signed)
Patient made aware we received his records/disc from the [place].

## 2018-05-07 ENCOUNTER — Telehealth: Payer: Self-pay | Admitting: Neurology

## 2018-05-07 NOTE — Telephone Encounter (Signed)
Please do let patient know that while I did receive records from [facility], there were not Botox records present.  I received ophthalmology records, but ophthalmology must not have done his Botox.  Ophthalmology notes state that they suspected left for spasm and referred patient to neurology for evaluation.  No neurology notes are present.  Also, I did get his disc from the [facility] and reviewed this.  He had a CT of the head on July 19, 2017.  I reviewed this.  There is atrophy which is mild, especially frontally.  MRI of the cervical spine was last done on 01/04/13 (note the patient had surgery on the neck in 2015).  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  No MRI of the cervical spine was completed after that.  There was a cervical spine x-ray on September 30, 2017 with evidence of multi-level advanced degenerative changes.

## 2018-05-07 NOTE — Telephone Encounter (Signed)
Patient coming in for visit tomorrow

## 2018-05-08 ENCOUNTER — Ambulatory Visit (INDEPENDENT_AMBULATORY_CARE_PROVIDER_SITE_OTHER): Payer: No Typology Code available for payment source | Admitting: Neurology

## 2018-05-08 DIAGNOSIS — G243 Spasmodic torticollis: Secondary | ICD-10-CM

## 2018-05-08 DIAGNOSIS — G245 Blepharospasm: Secondary | ICD-10-CM | POA: Diagnosis not present

## 2018-05-08 MED ORDER — ONABOTULINUMTOXINA 100 UNITS IJ SOLR
30.0000 [IU] | Freq: Once | INTRAMUSCULAR | Status: AC
  Administered 2018-05-08: 30 [IU] via INTRAMUSCULAR

## 2018-05-08 NOTE — Procedures (Signed)
Botulinum Clinic   Procedure Note Botox  Attending: Dr. Lurena Joiner Karem Tomaso  Preoperative Diagnosis(es): Blepharospasm    Consent obtained from: The patient Benefits discussed included, but were not limited to ability to open eyes and therefore see better.  Risk discussed included, but were not limited pain and discomfort, bleeding, bruising, excessive weakness, venous thrombosis, muscle atrophy and drooping of eyelids.  A copy of the patient medication guide was given to the patient which explains the blackbox warning.  Patients identity and treatment sites confirmed Yes.  .  Details of Procedure: Skin was cleaned with alcohol.  A 30 gauge, 1/2 inch needle was introduced to the target muscle.  Prior to injection, the needle plunger was aspirated to make sure the needle was not within a blood vessel.  There was no blood retrieved on aspiration.    Following is a summary of the muscles injected  And the amount of Botulinum toxin used:   Dilution 0.9% preservative free saline mixed with 100 u Botox type A to make 5 U per 0.1cc (2 cc of saline used per 100 U botox)  Injections  Location Left  Right Units  Upper lateral orbicularis oculi  2.5 2.5 5.0  Upper medial orbicularis oculi      Lateral orbicularis oculi  5.0 5.0 10.0  Lower lateral orbicularis oculi  2.5 2.5 5.0  Lower medial orbicularis oculi     Trapezius          TOTAL UNITS:   20   Agent: Botulinum Type A ( Onobotulinum Toxin type A ).  1 vials of Botox were used, each containing 50 units and freshly diluted with 2 mL of sterile, non-perserved saline   Total injected (Units): 20  Total wasted (Units): 31   Pt tolerated procedure well without complications.   Reinjection is anticipated in 3 months.

## 2018-06-26 ENCOUNTER — Telehealth: Payer: Self-pay | Admitting: Neurology

## 2018-06-26 NOTE — Telephone Encounter (Signed)
Spoke with patient and discussed e-visit to follow up from Botox injection for blepharospasm. Patient declined visit. He states he is doing well. No redness, swelling, etc. He believes the injections are working and feels his eyes are open more of the time. He doesn't have a video capable device and is unable to have an e-visit. Dr. Arbutus Leas Lorain Childes.

## 2018-06-26 NOTE — Telephone Encounter (Signed)
Okay.  We may have to postpone future botox but we will make those decisions in the future based on [facility] policy at the time

## 2018-07-01 ENCOUNTER — Ambulatory Visit: Payer: Non-veteran care | Admitting: Neurology

## 2018-07-03 ENCOUNTER — Encounter: Payer: Self-pay | Admitting: Neurology

## 2018-07-03 NOTE — Progress Notes (Signed)
Botox approval valid through the [place]. Patient to use buy and bill.

## 2018-08-06 ENCOUNTER — Encounter: Payer: Self-pay | Admitting: *Deleted

## 2018-08-07 ENCOUNTER — Other Ambulatory Visit: Payer: Self-pay

## 2018-08-07 ENCOUNTER — Ambulatory Visit (INDEPENDENT_AMBULATORY_CARE_PROVIDER_SITE_OTHER): Payer: No Typology Code available for payment source | Admitting: Neurology

## 2018-08-07 ENCOUNTER — Encounter: Payer: Self-pay | Admitting: Neurology

## 2018-08-07 DIAGNOSIS — G245 Blepharospasm: Secondary | ICD-10-CM | POA: Diagnosis not present

## 2018-08-07 MED ORDER — ONABOTULINUMTOXINA 100 UNITS IJ SOLR
25.0000 [IU] | Freq: Once | INTRAMUSCULAR | Status: AC
  Administered 2018-08-07: 25 [IU] via INTRAMUSCULAR

## 2018-08-07 NOTE — Procedures (Signed)
Botulinum Clinic   Procedure Note Botox  Attending: Dr. Lurena Joiner Tat  Preoperative Diagnosis(es): Blepharospasm    Consent obtained from: The patient Benefits discussed included, but were not limited to ability to open eyes and therefore see better.  Risk discussed included, but were not limited pain and discomfort, bleeding, bruising, excessive weakness, venous thrombosis, muscle atrophy and drooping of eyelids.  A copy of the patient medication guide was given to the patient which explains the blackbox warning.  Patients identity and treatment sites confirmed Yes.  .  Details of Procedure: Skin was cleaned with alcohol.  A 30 gauge, 1/2 inch needle was introduced to the target muscle.  Prior to injection, the needle plunger was aspirated to make sure the needle was not within a blood vessel.  There was no blood retrieved on aspiration.    Following is a summary of the muscles injected  And the amount of Botulinum toxin used:   Dilution 0.9% preservative free saline mixed with 100 u Botox type A to make 5 U per 0.1cc (2 cc of saline used per 100 U botox)  Injections  Location Left  Right Units  Upper lateral orbicularis oculi  2.5 2.5 5.0  Upper medial orbicularis oculi  2.5 2.5 5.0  Lateral orbicularis oculi  5.0 5.0 10.0  Lower lateral orbicularis oculi  2.5 2.5 5.0  Lower medial orbicularis oculi     Trapezius          TOTAL UNITS:   25   Agent: Botulinum Type A ( Onobotulinum Toxin type A ).  1 vials of Botox were used, each containing 50 units and freshly diluted with 2 mL of sterile, non-perserved saline   Total injected (Units): 25  Total wasted (Units): 25   Pt tolerated procedure well without complications.   Reinjection is anticipated in 3 months.

## 2018-08-19 ENCOUNTER — Telehealth: Payer: Self-pay | Admitting: Neurology

## 2018-08-19 NOTE — Telephone Encounter (Signed)
Patient called regarding need to know if Dr. Arbutus Leas could recommend any Eye drops that the patient could use. He said he gets Botox for his Eyes. He said the [place] gave him some but he said "it makes his eyes crusty" so he stopped them. Please Call. Thanks

## 2018-08-20 NOTE — Telephone Encounter (Signed)
That is way out of my field of expertise.  When he was getting botox at the [facility] it was by an ophthalmologist.  He needs to see ophthalmology for recommendations on eye drops.

## 2018-08-20 NOTE — Telephone Encounter (Signed)
Please advise 

## 2018-08-20 NOTE — Telephone Encounter (Signed)
Patient instructed to call his ophthalmologist to possibly change eye drops.

## 2018-09-03 ENCOUNTER — Telehealth: Payer: Self-pay

## 2018-09-03 NOTE — Telephone Encounter (Signed)
Patient called and would like to start back his venom injections.  Please Advise

## 2018-09-03 NOTE — Telephone Encounter (Signed)
Patient last received an injection on 03/20/2018. Patient received 0.05 of MV 30 mcg/ml and 0.05 of wasp 10 mcg/ml. Where would you like to restart patient?

## 2018-09-03 NOTE — Telephone Encounter (Signed)
Called and left message for patient to call the office in regards to this matter. Will need to schedule an OV to restart.

## 2018-09-03 NOTE — Telephone Encounter (Signed)
Patient called back and was informed of where we would restart his venom and he has scheduled an appt for 09/09/2018.

## 2018-09-03 NOTE — Telephone Encounter (Signed)
We are going to have to decrease quite a bit since it has been six months.  Let's do the following:   MV: start at 0.05 mL of of the 0.03 mcg concentration  Wasp: start at 0.05 mL of the 0.01 mcg concentration   Thanks, Malachi Bonds, MD [facility]

## 2018-09-09 ENCOUNTER — Ambulatory Visit (INDEPENDENT_AMBULATORY_CARE_PROVIDER_SITE_OTHER): Payer: PRIVATE HEALTH INSURANCE

## 2018-09-09 ENCOUNTER — Other Ambulatory Visit: Payer: Self-pay

## 2018-09-09 DIAGNOSIS — T63481D Toxic effect of venom of other arthropod, accidental (unintentional), subsequent encounter: Secondary | ICD-10-CM

## 2018-09-16 ENCOUNTER — Other Ambulatory Visit: Payer: Non-veteran care

## 2018-09-16 ENCOUNTER — Ambulatory Visit (INDEPENDENT_AMBULATORY_CARE_PROVIDER_SITE_OTHER): Payer: PRIVATE HEALTH INSURANCE

## 2018-09-16 ENCOUNTER — Other Ambulatory Visit: Payer: Self-pay

## 2018-09-16 DIAGNOSIS — T63481D Toxic effect of venom of other arthropod, accidental (unintentional), subsequent encounter: Secondary | ICD-10-CM

## 2018-09-16 DIAGNOSIS — Z20822 Contact with and (suspected) exposure to covid-19: Secondary | ICD-10-CM

## 2018-09-22 LAB — NOVEL CORONAVIRUS, NAA: SARS-CoV-2, NAA: NOT DETECTED

## 2018-09-23 ENCOUNTER — Ambulatory Visit: Payer: Self-pay

## 2018-09-25 ENCOUNTER — Ambulatory Visit (INDEPENDENT_AMBULATORY_CARE_PROVIDER_SITE_OTHER): Payer: PRIVATE HEALTH INSURANCE

## 2018-09-25 ENCOUNTER — Ambulatory Visit: Payer: Self-pay

## 2018-09-25 ENCOUNTER — Other Ambulatory Visit: Payer: Self-pay

## 2018-09-25 DIAGNOSIS — T63481D Toxic effect of venom of other arthropod, accidental (unintentional), subsequent encounter: Secondary | ICD-10-CM

## 2018-09-30 ENCOUNTER — Other Ambulatory Visit: Payer: Self-pay

## 2018-09-30 ENCOUNTER — Telehealth: Payer: Self-pay | Admitting: Neurology

## 2018-09-30 ENCOUNTER — Ambulatory Visit (INDEPENDENT_AMBULATORY_CARE_PROVIDER_SITE_OTHER): Payer: PRIVATE HEALTH INSURANCE

## 2018-09-30 DIAGNOSIS — T63481D Toxic effect of venom of other arthropod, accidental (unintentional), subsequent encounter: Secondary | ICD-10-CM

## 2018-09-30 NOTE — Telephone Encounter (Signed)
Therapist is wanting him to travel to [place] for sessions now. They were doing them VV but now she is wanting to see him in person. He said he wasn't able to drive until he got the OK from you. Please call him back. Thanks!

## 2018-09-30 NOTE — Telephone Encounter (Signed)
Pt last office visit 04/06/18 Pt was not to drive Follow up appt 12/18/18 for botox injection  Please advise

## 2018-09-30 NOTE — Telephone Encounter (Signed)
I can't release him to do that.  I increased dosage last visit and don't know how he responded.  I didn't order the therapy though I don't think

## 2018-09-30 NOTE — Telephone Encounter (Signed)
No you did not referral him to therapy another provider did.  He was informed of Dr. Carles Collet response below he is aware and understands He will have family drive him to appt. He states he only ask because the therapist ask him to ask you

## 2018-10-07 ENCOUNTER — Ambulatory Visit (INDEPENDENT_AMBULATORY_CARE_PROVIDER_SITE_OTHER): Payer: PRIVATE HEALTH INSURANCE

## 2018-10-07 ENCOUNTER — Other Ambulatory Visit: Payer: Self-pay

## 2018-10-07 DIAGNOSIS — T63481D Toxic effect of venom of other arthropod, accidental (unintentional), subsequent encounter: Secondary | ICD-10-CM

## 2018-10-26 ENCOUNTER — Encounter: Payer: Self-pay | Admitting: Neurology

## 2018-10-26 ENCOUNTER — Telehealth: Payer: Self-pay | Admitting: Neurology

## 2018-10-26 NOTE — Telephone Encounter (Signed)
Spoke with patient he is requesting a print out with office letter head of the last 3 appts he has had in the office. Will see what I can do then mail to patient.  This is for travel pay for [facility]

## 2018-10-26 NOTE — Telephone Encounter (Signed)
Letter created and mailed to patient today

## 2018-10-26 NOTE — Telephone Encounter (Signed)
New Message  Patient verbalized he is needing a letter of all of his visits so he can take to the [place] on this upcoming Thursday so he can receive travel pay.

## 2018-10-28 ENCOUNTER — Telehealth: Payer: Self-pay | Admitting: Neurology

## 2018-10-28 NOTE — Telephone Encounter (Signed)
Patient left msg about needing a letter for [place]. He did not give clarification.

## 2018-10-28 NOTE — Telephone Encounter (Signed)
Called spoke with patient he states that his letter that was mail out early this week has not came in the mail yet. Patient has appt tomorrow. I informed patient the day we spoke about letter that it may not arrive in mail before his appt. Pt found old letter that was written for him before he will just write in the other dates he was seen in the office and hopefully they will accept that.  Pt calling about travel pay for [facility] benefits letter.  Pt aware to call office tomorrow during appt if they will not accept what he has we can fax the current letter to them if he provides a fax#

## 2018-11-02 NOTE — Telephone Encounter (Signed)
Called patient for clarification pt states that  Dr. Porfirio Oar at [facility] in [place] will be taken over his botox injections. Patient states that provider is requesting a letter from Dr. Carles Collet with the amount of botox given in each eye. I informed patient that Dr. Porfirio Oar office can call our office to request medical records patient will also need to sign release. This way we can release a copy of the office note for Dr. Porfirio Oar to see Dr. Carles Collet note.  Pt will contact Dr. Porfirio Oar office to have them call our office to request records.

## 2018-11-02 NOTE — Telephone Encounter (Signed)
Patient needs  Randy [name] information to turn into the [place]. He needs the number of shots around each eye and the dosage around each eye  From his visit on May 1st

## 2018-11-02 NOTE — Telephone Encounter (Signed)
He needs this information before Aug 6th

## 2018-11-25 ENCOUNTER — Ambulatory Visit: Payer: PRIVATE HEALTH INSURANCE

## 2018-11-27 ENCOUNTER — Ambulatory Visit (INDEPENDENT_AMBULATORY_CARE_PROVIDER_SITE_OTHER): Payer: PRIVATE HEALTH INSURANCE | Admitting: *Deleted

## 2018-11-27 ENCOUNTER — Other Ambulatory Visit: Payer: Self-pay

## 2018-11-27 DIAGNOSIS — T63481D Toxic effect of venom of other arthropod, accidental (unintentional), subsequent encounter: Secondary | ICD-10-CM

## 2018-12-02 ENCOUNTER — Ambulatory Visit (INDEPENDENT_AMBULATORY_CARE_PROVIDER_SITE_OTHER): Payer: PRIVATE HEALTH INSURANCE | Admitting: *Deleted

## 2018-12-02 ENCOUNTER — Other Ambulatory Visit: Payer: Self-pay

## 2018-12-02 DIAGNOSIS — Z91038 Other insect allergy status: Secondary | ICD-10-CM

## 2018-12-02 DIAGNOSIS — Z91030 Bee allergy status: Secondary | ICD-10-CM | POA: Diagnosis not present

## 2018-12-04 ENCOUNTER — Ambulatory Visit: Payer: Self-pay

## 2018-12-09 ENCOUNTER — Ambulatory Visit (INDEPENDENT_AMBULATORY_CARE_PROVIDER_SITE_OTHER): Payer: PRIVATE HEALTH INSURANCE | Admitting: *Deleted

## 2018-12-09 ENCOUNTER — Other Ambulatory Visit: Payer: Self-pay

## 2018-12-09 DIAGNOSIS — Z91030 Bee allergy status: Secondary | ICD-10-CM

## 2018-12-09 DIAGNOSIS — Z91038 Other insect allergy status: Secondary | ICD-10-CM

## 2018-12-16 ENCOUNTER — Telehealth: Payer: Self-pay

## 2018-12-16 ENCOUNTER — Other Ambulatory Visit: Payer: Self-pay

## 2018-12-16 ENCOUNTER — Ambulatory Visit: Payer: PRIVATE HEALTH INSURANCE

## 2018-12-16 ENCOUNTER — Ambulatory Visit (INDEPENDENT_AMBULATORY_CARE_PROVIDER_SITE_OTHER): Payer: PRIVATE HEALTH INSURANCE

## 2018-12-16 DIAGNOSIS — Z91030 Bee allergy status: Secondary | ICD-10-CM

## 2018-12-16 DIAGNOSIS — Z91038 Other insect allergy status: Secondary | ICD-10-CM

## 2018-12-16 NOTE — Telephone Encounter (Signed)
Called patient to confirm that he is no coming in for Botox on Friday.  Spoke with patient he states to cancel appt for Friday he is receiving botox injection at [facility]  appt for 12/18/18 cancelled

## 2018-12-18 ENCOUNTER — Ambulatory Visit: Payer: Non-veteran care | Admitting: Neurology

## 2018-12-23 ENCOUNTER — Other Ambulatory Visit: Payer: Self-pay

## 2018-12-23 ENCOUNTER — Ambulatory Visit (INDEPENDENT_AMBULATORY_CARE_PROVIDER_SITE_OTHER): Payer: PRIVATE HEALTH INSURANCE | Admitting: *Deleted

## 2018-12-23 DIAGNOSIS — Z91038 Other insect allergy status: Secondary | ICD-10-CM

## 2018-12-23 DIAGNOSIS — Z91030 Bee allergy status: Secondary | ICD-10-CM

## 2018-12-30 ENCOUNTER — Ambulatory Visit: Payer: Self-pay

## 2019-01-01 ENCOUNTER — Other Ambulatory Visit: Payer: Self-pay

## 2019-01-01 ENCOUNTER — Ambulatory Visit (INDEPENDENT_AMBULATORY_CARE_PROVIDER_SITE_OTHER): Payer: PRIVATE HEALTH INSURANCE | Admitting: *Deleted

## 2019-01-01 DIAGNOSIS — Z91030 Bee allergy status: Secondary | ICD-10-CM | POA: Diagnosis not present

## 2019-01-01 DIAGNOSIS — Z91038 Other insect allergy status: Secondary | ICD-10-CM

## 2019-01-08 ENCOUNTER — Ambulatory Visit (INDEPENDENT_AMBULATORY_CARE_PROVIDER_SITE_OTHER): Payer: PRIVATE HEALTH INSURANCE

## 2019-01-08 ENCOUNTER — Other Ambulatory Visit: Payer: Self-pay

## 2019-01-08 DIAGNOSIS — Z91030 Bee allergy status: Secondary | ICD-10-CM | POA: Diagnosis not present

## 2019-01-08 DIAGNOSIS — Z91038 Other insect allergy status: Secondary | ICD-10-CM

## 2019-01-15 ENCOUNTER — Ambulatory Visit (INDEPENDENT_AMBULATORY_CARE_PROVIDER_SITE_OTHER): Payer: PRIVATE HEALTH INSURANCE

## 2019-01-15 DIAGNOSIS — Z91030 Bee allergy status: Secondary | ICD-10-CM | POA: Diagnosis not present

## 2019-01-22 ENCOUNTER — Ambulatory Visit (INDEPENDENT_AMBULATORY_CARE_PROVIDER_SITE_OTHER): Payer: PRIVATE HEALTH INSURANCE

## 2019-01-22 DIAGNOSIS — Z91038 Other insect allergy status: Secondary | ICD-10-CM

## 2019-01-22 DIAGNOSIS — Z91030 Bee allergy status: Secondary | ICD-10-CM

## 2019-01-29 ENCOUNTER — Ambulatory Visit: Payer: Self-pay

## 2019-02-03 ENCOUNTER — Ambulatory Visit (INDEPENDENT_AMBULATORY_CARE_PROVIDER_SITE_OTHER): Payer: PRIVATE HEALTH INSURANCE | Admitting: *Deleted

## 2019-02-03 ENCOUNTER — Other Ambulatory Visit: Payer: Self-pay

## 2019-02-03 DIAGNOSIS — Z91038 Other insect allergy status: Secondary | ICD-10-CM

## 2019-02-03 DIAGNOSIS — Z91030 Bee allergy status: Secondary | ICD-10-CM

## 2019-02-10 ENCOUNTER — Ambulatory Visit: Payer: Self-pay

## 2019-02-10 ENCOUNTER — Ambulatory Visit (INDEPENDENT_AMBULATORY_CARE_PROVIDER_SITE_OTHER): Payer: PRIVATE HEALTH INSURANCE

## 2019-02-10 DIAGNOSIS — Z91030 Bee allergy status: Secondary | ICD-10-CM

## 2019-02-10 DIAGNOSIS — Z91038 Other insect allergy status: Secondary | ICD-10-CM

## 2019-02-17 ENCOUNTER — Other Ambulatory Visit: Payer: Self-pay

## 2019-02-17 ENCOUNTER — Ambulatory Visit (INDEPENDENT_AMBULATORY_CARE_PROVIDER_SITE_OTHER): Payer: PRIVATE HEALTH INSURANCE

## 2019-02-17 DIAGNOSIS — Z91030 Bee allergy status: Secondary | ICD-10-CM | POA: Diagnosis not present

## 2019-02-24 ENCOUNTER — Ambulatory Visit (INDEPENDENT_AMBULATORY_CARE_PROVIDER_SITE_OTHER): Payer: No Typology Code available for payment source

## 2019-02-24 ENCOUNTER — Ambulatory Visit: Payer: Self-pay

## 2019-02-24 DIAGNOSIS — Z91038 Other insect allergy status: Secondary | ICD-10-CM

## 2019-02-24 DIAGNOSIS — Z91030 Bee allergy status: Secondary | ICD-10-CM

## 2019-03-10 ENCOUNTER — Ambulatory Visit (INDEPENDENT_AMBULATORY_CARE_PROVIDER_SITE_OTHER): Payer: No Typology Code available for payment source

## 2019-03-10 ENCOUNTER — Other Ambulatory Visit: Payer: Self-pay

## 2019-03-10 DIAGNOSIS — Z91038 Other insect allergy status: Secondary | ICD-10-CM

## 2019-03-10 DIAGNOSIS — Z91030 Bee allergy status: Secondary | ICD-10-CM

## 2019-03-17 ENCOUNTER — Other Ambulatory Visit: Payer: Self-pay

## 2019-03-17 ENCOUNTER — Ambulatory Visit (INDEPENDENT_AMBULATORY_CARE_PROVIDER_SITE_OTHER): Payer: No Typology Code available for payment source | Admitting: *Deleted

## 2019-03-17 DIAGNOSIS — Z91038 Other insect allergy status: Secondary | ICD-10-CM

## 2019-03-17 DIAGNOSIS — Z91030 Bee allergy status: Secondary | ICD-10-CM

## 2019-03-24 ENCOUNTER — Ambulatory Visit (INDEPENDENT_AMBULATORY_CARE_PROVIDER_SITE_OTHER): Payer: No Typology Code available for payment source | Admitting: *Deleted

## 2019-03-24 ENCOUNTER — Other Ambulatory Visit: Payer: Self-pay

## 2019-03-24 DIAGNOSIS — Z91030 Bee allergy status: Secondary | ICD-10-CM

## 2019-03-24 DIAGNOSIS — Z91038 Other insect allergy status: Secondary | ICD-10-CM

## 2019-03-31 ENCOUNTER — Ambulatory Visit: Payer: Self-pay

## 2019-04-07 ENCOUNTER — Ambulatory Visit (INDEPENDENT_AMBULATORY_CARE_PROVIDER_SITE_OTHER): Payer: No Typology Code available for payment source

## 2019-04-07 ENCOUNTER — Ambulatory Visit: Payer: Self-pay

## 2019-04-07 DIAGNOSIS — Z91030 Bee allergy status: Secondary | ICD-10-CM | POA: Diagnosis not present

## 2019-04-07 DIAGNOSIS — Z91038 Other insect allergy status: Secondary | ICD-10-CM

## 2019-04-09 DIAGNOSIS — H409 Unspecified glaucoma: Secondary | ICD-10-CM

## 2019-04-09 HISTORY — DX: Unspecified glaucoma: H40.9

## 2019-04-14 ENCOUNTER — Ambulatory Visit (INDEPENDENT_AMBULATORY_CARE_PROVIDER_SITE_OTHER): Payer: No Typology Code available for payment source

## 2019-04-14 DIAGNOSIS — Z91030 Bee allergy status: Secondary | ICD-10-CM | POA: Diagnosis not present

## 2019-04-14 DIAGNOSIS — Z91038 Other insect allergy status: Secondary | ICD-10-CM

## 2019-04-16 ENCOUNTER — Ambulatory Visit: Payer: Self-pay

## 2019-04-21 ENCOUNTER — Ambulatory Visit (INDEPENDENT_AMBULATORY_CARE_PROVIDER_SITE_OTHER): Payer: No Typology Code available for payment source

## 2019-04-21 DIAGNOSIS — Z91030 Bee allergy status: Secondary | ICD-10-CM | POA: Diagnosis not present

## 2019-04-28 ENCOUNTER — Ambulatory Visit (INDEPENDENT_AMBULATORY_CARE_PROVIDER_SITE_OTHER): Payer: No Typology Code available for payment source

## 2019-04-28 DIAGNOSIS — Z91030 Bee allergy status: Secondary | ICD-10-CM | POA: Diagnosis not present

## 2019-05-05 ENCOUNTER — Ambulatory Visit (INDEPENDENT_AMBULATORY_CARE_PROVIDER_SITE_OTHER): Payer: No Typology Code available for payment source

## 2019-05-05 DIAGNOSIS — Z91030 Bee allergy status: Secondary | ICD-10-CM | POA: Diagnosis not present

## 2019-05-05 DIAGNOSIS — Z91038 Other insect allergy status: Secondary | ICD-10-CM

## 2019-05-12 ENCOUNTER — Ambulatory Visit (INDEPENDENT_AMBULATORY_CARE_PROVIDER_SITE_OTHER): Payer: No Typology Code available for payment source

## 2019-05-12 DIAGNOSIS — Z91030 Bee allergy status: Secondary | ICD-10-CM | POA: Diagnosis not present

## 2019-05-19 ENCOUNTER — Ambulatory Visit: Payer: Self-pay

## 2019-05-19 ENCOUNTER — Other Ambulatory Visit: Payer: Self-pay

## 2019-05-19 ENCOUNTER — Ambulatory Visit (INDEPENDENT_AMBULATORY_CARE_PROVIDER_SITE_OTHER): Payer: No Typology Code available for payment source

## 2019-05-19 DIAGNOSIS — Z91030 Bee allergy status: Secondary | ICD-10-CM

## 2019-05-19 DIAGNOSIS — Z91038 Other insect allergy status: Secondary | ICD-10-CM

## 2019-05-26 ENCOUNTER — Other Ambulatory Visit: Payer: Self-pay

## 2019-05-26 ENCOUNTER — Ambulatory Visit (INDEPENDENT_AMBULATORY_CARE_PROVIDER_SITE_OTHER): Payer: No Typology Code available for payment source

## 2019-05-26 ENCOUNTER — Ambulatory Visit: Payer: Self-pay

## 2019-05-26 DIAGNOSIS — Z91030 Bee allergy status: Secondary | ICD-10-CM | POA: Diagnosis not present

## 2019-05-26 DIAGNOSIS — Z91038 Other insect allergy status: Secondary | ICD-10-CM

## 2019-06-02 ENCOUNTER — Ambulatory Visit (INDEPENDENT_AMBULATORY_CARE_PROVIDER_SITE_OTHER): Payer: No Typology Code available for payment source

## 2019-06-02 DIAGNOSIS — Z91030 Bee allergy status: Secondary | ICD-10-CM

## 2019-06-02 DIAGNOSIS — Z91038 Other insect allergy status: Secondary | ICD-10-CM

## 2019-06-16 ENCOUNTER — Other Ambulatory Visit: Payer: Self-pay

## 2019-06-16 ENCOUNTER — Ambulatory Visit (INDEPENDENT_AMBULATORY_CARE_PROVIDER_SITE_OTHER): Payer: No Typology Code available for payment source

## 2019-06-16 DIAGNOSIS — Z91038 Other insect allergy status: Secondary | ICD-10-CM

## 2019-06-16 DIAGNOSIS — Z91030 Bee allergy status: Secondary | ICD-10-CM | POA: Diagnosis not present

## 2019-06-23 ENCOUNTER — Ambulatory Visit: Payer: Non-veteran care | Admitting: Family Medicine

## 2019-06-23 ENCOUNTER — Ambulatory Visit (INDEPENDENT_AMBULATORY_CARE_PROVIDER_SITE_OTHER): Payer: No Typology Code available for payment source

## 2019-06-23 ENCOUNTER — Other Ambulatory Visit: Payer: Self-pay

## 2019-06-23 DIAGNOSIS — Z91030 Bee allergy status: Secondary | ICD-10-CM | POA: Diagnosis not present

## 2019-06-23 DIAGNOSIS — Z91038 Other insect allergy status: Secondary | ICD-10-CM

## 2019-07-07 ENCOUNTER — Ambulatory Visit: Payer: Self-pay

## 2019-07-16 ENCOUNTER — Ambulatory Visit (INDEPENDENT_AMBULATORY_CARE_PROVIDER_SITE_OTHER): Payer: No Typology Code available for payment source

## 2019-07-16 ENCOUNTER — Other Ambulatory Visit: Payer: Self-pay

## 2019-07-16 DIAGNOSIS — Z91030 Bee allergy status: Secondary | ICD-10-CM | POA: Diagnosis not present

## 2019-07-16 DIAGNOSIS — Z91038 Other insect allergy status: Secondary | ICD-10-CM

## 2019-08-11 ENCOUNTER — Ambulatory Visit (INDEPENDENT_AMBULATORY_CARE_PROVIDER_SITE_OTHER): Payer: No Typology Code available for payment source

## 2019-08-11 ENCOUNTER — Other Ambulatory Visit: Payer: Self-pay

## 2019-08-11 DIAGNOSIS — Z91038 Other insect allergy status: Secondary | ICD-10-CM

## 2019-08-11 DIAGNOSIS — Z91030 Bee allergy status: Secondary | ICD-10-CM | POA: Diagnosis not present

## 2019-08-13 ENCOUNTER — Ambulatory Visit: Payer: Self-pay

## 2019-09-08 ENCOUNTER — Ambulatory Visit: Payer: Self-pay

## 2019-09-10 ENCOUNTER — Ambulatory Visit (INDEPENDENT_AMBULATORY_CARE_PROVIDER_SITE_OTHER): Payer: No Typology Code available for payment source

## 2019-09-10 ENCOUNTER — Other Ambulatory Visit: Payer: Self-pay

## 2019-09-10 DIAGNOSIS — Z91030 Bee allergy status: Secondary | ICD-10-CM | POA: Diagnosis not present

## 2019-09-10 DIAGNOSIS — Z91038 Other insect allergy status: Secondary | ICD-10-CM

## 2019-09-14 ENCOUNTER — Telehealth: Payer: Self-pay

## 2019-09-14 NOTE — Telephone Encounter (Signed)
I have sent a new va auth request to the [place] [facility]. Patient is scheduled to see Humphrey Rolls & receive his venom injection on 10-08-2019

## 2019-10-06 ENCOUNTER — Ambulatory Visit: Payer: Self-pay

## 2019-10-06 ENCOUNTER — Ambulatory Visit: Payer: No Typology Code available for payment source | Admitting: Family

## 2019-10-06 NOTE — Telephone Encounter (Signed)
I spoke with Bonita Quin at the [facility]. and she states the patient will need to call his PCP at the [place] to get a new auth. I have left a detailed Voicemail for the patient with this information.

## 2019-10-08 ENCOUNTER — Ambulatory Visit: Payer: No Typology Code available for payment source | Admitting: Family

## 2019-10-08 ENCOUNTER — Ambulatory Visit: Payer: Self-pay

## 2019-10-20 ENCOUNTER — Ambulatory Visit (INDEPENDENT_AMBULATORY_CARE_PROVIDER_SITE_OTHER): Payer: No Typology Code available for payment source

## 2019-10-20 ENCOUNTER — Other Ambulatory Visit: Payer: Self-pay

## 2019-10-20 ENCOUNTER — Ambulatory Visit: Payer: No Typology Code available for payment source | Admitting: Family

## 2019-10-20 DIAGNOSIS — Z91030 Bee allergy status: Secondary | ICD-10-CM | POA: Diagnosis not present

## 2019-10-21 NOTE — Telephone Encounter (Signed)
I left a voicemail for Aggie Cosier with [facility] [place] to check on the patients new auth.

## 2019-10-25 NOTE — Telephone Encounter (Signed)
Linda from [facility] called regarding patient's new authorization. Bonita Quin states patient needs an appointment down in order to create new authorization.   Left voicemail on patient's phone to make an appointment.

## 2019-11-02 ENCOUNTER — Encounter: Payer: Self-pay | Admitting: Allergy & Immunology

## 2019-11-02 ENCOUNTER — Ambulatory Visit (INDEPENDENT_AMBULATORY_CARE_PROVIDER_SITE_OTHER): Payer: No Typology Code available for payment source | Admitting: Allergy & Immunology

## 2019-11-02 ENCOUNTER — Other Ambulatory Visit: Payer: Self-pay

## 2019-11-02 DIAGNOSIS — L508 Other urticaria: Secondary | ICD-10-CM

## 2019-11-02 DIAGNOSIS — J3089 Other allergic rhinitis: Secondary | ICD-10-CM | POA: Diagnosis not present

## 2019-11-02 DIAGNOSIS — Z91030 Bee allergy status: Secondary | ICD-10-CM

## 2019-11-02 DIAGNOSIS — Z91038 Other insect allergy status: Secondary | ICD-10-CM

## 2019-11-02 NOTE — Progress Notes (Signed)
RE: Randy [name] MRN: [id number] DOB: 15-Apr-1950 Date of Telemedicine Visit: 11/02/2019  Referring provider: Garey Ham, MD Primary care provider: Garey Ham, MD  Chief Complaint: Allergic Rhinitis  (Things are going well, he is currently doing 1 shot a month for his immoutherapy)   Telemedicine Follow Up Visit via Telephone: I connected with Randy [name] for a follow up on 11/02/19 by telephone and verified that I am speaking with the correct person using two identifiers.   I discussed the limitations, risks, security and privacy concerns of performing an evaluation and management service by telephone and the availability of in person appointments. I also discussed with the patient that there may be a patient responsible charge related to this service. The patient expressed understanding and agreed to proceed.  Patient is at home. Provider is at the office.  Visit start time: 4:52 PM Visit end time: 5:10 PM Insurance consent/check in by: Fredric Mare Medical consent and medical assistant/nurse: Wynona Canes  History of Present Illness:  He is a 69 y.o. male, who is being followed for venom hypersensitivity. His previous allergy office visit was in 2019 with myself.  He was last seen in October 2019.  At that time, his urticaria had improved on antihistamines and avoidance measures.  He was actually able to decrease some of his antihistamines.  He also has a history of venom hypersensitivity.  We continue with venom immunotherapy at the same rate.  Since the last visit, he has done very well. He reports that he is feeling "OK". Venom shots are going fine. He is not having any reactions. He has not been stung by any insects. He does not spend a lot of time outdoors. He does not need a refill on his EpiPen. He has had no problems with advancing his immunotherapy. Hives have been essentially quiescent.   He was recently diagnosed with glaucoma. He started some eye drops that he cannot  remember the name of. He is also going to see a podiatrist soon for some foot issues. It sounds like gout, but his phone connection is fairly awful.   Otherwise, there have been no changes to his past medical history, surgical history, family history, or social history.  Assessment and Plan:  Randy [name] is a 69 y.o. 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  Randy [name] is tolerating his immunotherapy per protocol and having no problems with advancing.  We are going to refill his EpiPen today.  Regarding his ocular and foot concerns, I did refer to his PCP.  However, he seemed to enjoy having someone to talk to.    Diagnostics: None.  Medication List:  Current Outpatient Medications  Medication Sig Dispense Refill  . aspirin EC 81 MG tablet Take 81 mg by mouth daily.    Marland Kitchen atorvastatin (LIPITOR) 80 MG tablet Take 40 mg by mouth daily.     . Carboxymethylcell-Hypromellose (GENTEAL OP) Apply to eye as needed.    . cetirizine (ZYRTEC) 10 MG tablet Take 2 tablets (20 mg total) by mouth 2 (two) times daily. 120 tablet 5  . EPINEPHrine (EPIPEN 2-PAK) 0.3 mg/0.3 mL IJ SOAJ injection Inject 0.3 mg into the muscle once.    . famotidine (PEPCID) 20 MG tablet Take 20 mg by mouth  2 (two) times daily.    Marland Kitchen lidocaine (XYLOCAINE) 5 % ointment Apply 1 application topically as needed.    Marland Kitchen lisinopril (PRINIVIL,ZESTRIL) 40 MG tablet Take 20 mg by mouth daily.     . naproxen sodium (ANAPROX) 275 MG tablet Take 275 mg by mouth 2 (two) times daily with a meal.    . omeprazole (PRILOSEC) 20 MG capsule Take 20 mg by mouth daily.    . sildenafil (VIAGRA) 100 MG tablet Take 100 mg by mouth daily as needed for erectile dysfunction.    Marland Kitchen terazosin (HYTRIN)  2 MG capsule Take 4 mg by mouth at bedtime.      No current facility-administered medications for this visit.   Allergies: Allergies  Allergen Reactions  . Hornet Venom Anaphylaxis  . Wasp Venom Anaphylaxis  . Yellow Jacket Venom [Bee Venom] Anaphylaxis   I reviewed his past medical history, social history, family history, and environmental history and no significant changes have been reported from previous visits.  Review of Systems  Constitutional: Negative for chills, diaphoresis, fatigue and fever.  HENT: Negative for congestion, ear discharge, ear pain, facial swelling, postnasal drip, rhinorrhea, sinus pressure, sinus pain and sore throat.   Eyes: Negative for pain, discharge and itching.  Respiratory: Negative for apnea, cough, chest tightness and shortness of breath.   Cardiovascular: Negative for chest pain.  Gastrointestinal: Negative for diarrhea and nausea.  Musculoskeletal: Negative for arthralgias and myalgias.  Skin: Negative for rash.  Allergic/Immunologic: Negative for environmental allergies and food allergies.    Objective:  Physical exam not obtained as encounter was done via telephone.   Previous notes and tests were reviewed.  I discussed the assessment and treatment plan with the patient. The patient was provided an opportunity to ask questions and all were answered. The patient agreed with the plan and demonstrated an understanding of the instructions.   The patient was advised to call back or seek an in-person evaluation if the symptoms worsen or if the condition fails to improve as anticipated.  I provided 18 minutes of non-face-to-face time during this encounter.  It was my pleasure to participate in Randy [name]'s care today. Please feel free to contact me with any questions or concerns.   Sincerely,  Alfonse Spruce, MD

## 2019-11-03 ENCOUNTER — Encounter: Payer: Self-pay | Admitting: Allergy & Immunology

## 2019-11-17 ENCOUNTER — Ambulatory Visit (INDEPENDENT_AMBULATORY_CARE_PROVIDER_SITE_OTHER): Payer: No Typology Code available for payment source

## 2019-11-17 ENCOUNTER — Other Ambulatory Visit: Payer: Self-pay

## 2019-11-17 DIAGNOSIS — Z91038 Other insect allergy status: Secondary | ICD-10-CM

## 2019-11-17 DIAGNOSIS — Z91030 Bee allergy status: Secondary | ICD-10-CM

## 2019-12-15 ENCOUNTER — Other Ambulatory Visit: Payer: Self-pay

## 2019-12-15 ENCOUNTER — Ambulatory Visit (INDEPENDENT_AMBULATORY_CARE_PROVIDER_SITE_OTHER): Payer: No Typology Code available for payment source

## 2019-12-15 DIAGNOSIS — Z91030 Bee allergy status: Secondary | ICD-10-CM | POA: Diagnosis not present

## 2019-12-15 DIAGNOSIS — Z91038 Other insect allergy status: Secondary | ICD-10-CM

## 2019-12-28 ENCOUNTER — Ambulatory Visit: Payer: No Typology Code available for payment source | Admitting: Podiatry

## 2020-01-06 ENCOUNTER — Ambulatory Visit: Payer: No Typology Code available for payment source | Admitting: Podiatry

## 2020-01-12 ENCOUNTER — Ambulatory Visit (INDEPENDENT_AMBULATORY_CARE_PROVIDER_SITE_OTHER): Payer: No Typology Code available for payment source

## 2020-01-12 ENCOUNTER — Other Ambulatory Visit: Payer: Self-pay

## 2020-01-12 DIAGNOSIS — Z91038 Other insect allergy status: Secondary | ICD-10-CM

## 2020-01-13 ENCOUNTER — Encounter: Payer: Self-pay | Admitting: Podiatry

## 2020-01-13 ENCOUNTER — Ambulatory Visit (INDEPENDENT_AMBULATORY_CARE_PROVIDER_SITE_OTHER): Payer: No Typology Code available for payment source | Admitting: Podiatry

## 2020-01-13 ENCOUNTER — Other Ambulatory Visit: Payer: Self-pay

## 2020-01-13 DIAGNOSIS — M21619 Bunion of unspecified foot: Secondary | ICD-10-CM

## 2020-01-13 DIAGNOSIS — G629 Polyneuropathy, unspecified: Secondary | ICD-10-CM | POA: Diagnosis not present

## 2020-01-13 DIAGNOSIS — B351 Tinea unguium: Secondary | ICD-10-CM

## 2020-01-15 NOTE — Progress Notes (Signed)
Subjective:   Patient ID: Randy [name], male   DOB: 69 y.o.   MRN: [id number]   HPI Patient presents stating he has had tingling in his great toe right and he is concerned about this and he has a history of severe neck and back issues   Review of Systems  All other systems reviewed and are negative.       Objective:  Physical Exam Vitals and nursing note reviewed.  Constitutional:      Appearance: He is well-developed.  Pulmonary:     Effort: Pulmonary effort is normal.  Musculoskeletal:        General: Normal range of motion.  Skin:    General: Skin is warm.  Neurological:     Mental Status: He is alert.     Vascular status found to be diminished both sharp dull vibratory DTR reflexes.  Patient is found to have normal circulatory status and does appear to have some low to mid grade symptomatology of the right hallux with tingling pain noted that is localized to this area with no muscle strength loss of the extensor tendons or other issues     Assessment:  Possibility that this is more related to his back or neck issues that the patient had chronic and is having addressed currently     Plan:  H&P reviewed condition and x-rays at great length.  I do not recommend current treatment even though gabapentin or other treatments may be necessary and I did discuss with him having his back and neck continued to be evaluated and that treatment there may be of benefit to him.  Also discussed topical medicine shoe gear modification  X-rays indicate that there is moderate arthritis but I did not note any other advanced pathology or other issues

## 2020-01-21 ENCOUNTER — Encounter: Payer: Self-pay | Admitting: Podiatry

## 2020-01-26 ENCOUNTER — Telehealth: Payer: Self-pay | Admitting: Allergy & Immunology

## 2020-01-26 NOTE — Telephone Encounter (Signed)
Can I send note or do you need to handle that?

## 2020-01-26 NOTE — Telephone Encounter (Signed)
[facility] would like notes from [facility]/27 office visit faxed [phone].

## 2020-01-28 NOTE — Telephone Encounter (Signed)
Patient's note has been sent to the [facility] .

## 2020-02-09 ENCOUNTER — Ambulatory Visit: Payer: Self-pay

## 2020-02-16 ENCOUNTER — Other Ambulatory Visit: Payer: Self-pay

## 2020-02-16 ENCOUNTER — Ambulatory Visit (INDEPENDENT_AMBULATORY_CARE_PROVIDER_SITE_OTHER): Payer: No Typology Code available for payment source

## 2020-02-16 DIAGNOSIS — Z91038 Other insect allergy status: Secondary | ICD-10-CM

## 2020-03-15 ENCOUNTER — Ambulatory Visit (INDEPENDENT_AMBULATORY_CARE_PROVIDER_SITE_OTHER): Payer: No Typology Code available for payment source

## 2020-03-15 ENCOUNTER — Other Ambulatory Visit: Payer: Self-pay

## 2020-03-15 DIAGNOSIS — Z91038 Other insect allergy status: Secondary | ICD-10-CM | POA: Diagnosis not present

## 2020-03-27 ENCOUNTER — Other Ambulatory Visit: Payer: Self-pay

## 2020-03-27 ENCOUNTER — Encounter (HOSPITAL_COMMUNITY): Payer: Self-pay | Admitting: *Deleted

## 2020-03-27 ENCOUNTER — Emergency Department (HOSPITAL_COMMUNITY)
Admission: EM | Admit: 2020-03-27 | Discharge: 2020-03-27 | Disposition: A | Discharge status: Home / Self Care | Payer: No Typology Code available for payment source | Attending: Emergency Medicine | Admitting: Emergency Medicine

## 2020-03-27 ENCOUNTER — Emergency Department (HOSPITAL_COMMUNITY): Payer: No Typology Code available for payment source

## 2020-03-27 DIAGNOSIS — E860 Dehydration: Secondary | ICD-10-CM

## 2020-03-27 DIAGNOSIS — F17200 Nicotine dependence, unspecified, uncomplicated: Secondary | ICD-10-CM | POA: Insufficient documentation

## 2020-03-27 DIAGNOSIS — R42 Dizziness and giddiness: Secondary | ICD-10-CM

## 2020-03-27 DIAGNOSIS — Z20822 Contact with and (suspected) exposure to covid-19: Secondary | ICD-10-CM | POA: Insufficient documentation

## 2020-03-27 DIAGNOSIS — K5900 Constipation, unspecified: Secondary | ICD-10-CM

## 2020-03-27 LAB — CBC WITH DIFFERENTIAL/PLATELET
Abs Immature Granulocytes: 0.01 10*3/uL (ref 0.00–0.07)
Basophils Absolute: 0 10*3/uL (ref 0.0–0.1)
Basophils Relative: 0 %
Eosinophils Absolute: 0.1 10*3/uL (ref 0.0–0.5)
Eosinophils Relative: 2 %
HCT: 49.3 % (ref 39.0–52.0)
Hemoglobin: 16.2 g/dL (ref 13.0–17.0)
Immature Granulocytes: 0 %
Lymphocytes Relative: 37 %
Lymphs Abs: 2.1 10*3/uL (ref 0.7–4.0)
MCH: 30.9 pg (ref 26.0–34.0)
MCHC: 32.9 g/dL (ref 30.0–36.0)
MCV: 93.9 fL (ref 80.0–100.0)
Monocytes Absolute: 0.7 10*3/uL (ref 0.1–1.0)
Monocytes Relative: 11 %
Neutro Abs: 2.9 10*3/uL (ref 1.7–7.7)
Neutrophils Relative %: 50 %
Platelets: 224 10*3/uL (ref 150–400)
RBC: 5.25 MIL/uL (ref 4.22–5.81)
RDW: 15.1 % (ref 11.5–15.5)
WBC: 5.8 10*3/uL (ref 4.0–10.5)
nRBC: 0 % (ref 0.0–0.2)

## 2020-03-27 LAB — RESP PANEL BY RT-PCR (FLU A&B, COVID) ARPGX2
Influenza A by PCR: NEGATIVE
Influenza B by PCR: NEGATIVE
SARS Coronavirus 2 by RT PCR: NEGATIVE

## 2020-03-27 LAB — COMPREHENSIVE METABOLIC PANEL
ALT: 13 U/L (ref 0–44)
AST: 19 U/L (ref 15–41)
Albumin: 4.2 g/dL (ref 3.5–5.0)
Alkaline Phosphatase: 80 U/L (ref 38–126)
Anion gap: 9 (ref 5–15)
BUN: 10 mg/dL (ref 8–23)
CO2: 27 mmol/L (ref 22–32)
Calcium: 9.7 mg/dL (ref 8.9–10.3)
Chloride: 104 mmol/L (ref 98–111)
Creatinine, Ser: 1.03 mg/dL (ref 0.61–1.24)
GFR, Estimated: >60 mL/min (ref >60)
Glucose, Bld: 89 mg/dL (ref 70–99)
Potassium: 3.8 mmol/L (ref 3.5–5.1)
Sodium: 140 mmol/L (ref 135–145)
Total Bilirubin: 0.7 mg/dL (ref 0.3–1.2)
Total Protein: 7.5 g/dL (ref 6.5–8.1)

## 2020-03-27 LAB — LIPASE, BLOOD: Lipase: 33 U/L (ref 11–51)

## 2020-03-27 LAB — TROPONIN I (HIGH SENSITIVITY): Troponin I (High Sensitivity): 4 ng/L (ref <18)

## 2020-03-27 MED ORDER — SODIUM CHLORIDE 0.9 % IV BOLUS
500.0000 mL | Freq: Once | INTRAVENOUS | Status: AC
  Administered 2020-03-27: 14:00:00 500 mL via INTRAVENOUS

## 2020-03-27 NOTE — ED Triage Notes (Signed)
Lightheaded and dizzy for 2 weeks 

## 2020-03-27 NOTE — Discharge Instructions (Signed)
You were seen in the emergency department today with lightheadedness when you stand up.  Your labs and vital signs are reassuring.  You discuss some ongoing constipation issues that you have and you can take over-the-counter MiraLAX for that.  You can go to any pharmacy and ask them for help and selecting the MiraLAX.  You can also take Metamucil once you become more regular to keep it that way.  Please drink plenty of hydrating fluids and follow closely with your primary care doctor.  They can decide if further testing is needed depending on how your symptoms progress.  If you develop any new or suddenly worsening symptoms you will need to return to the emergency department for evaluation.

## 2020-03-27 NOTE — ED Provider Notes (Signed)
Emergency Department Provider Note   I have reviewed the triage vital signs and the nursing notes.   HISTORY  Chief Complaint Dizziness   HPI Randy [name] is a 69 y.o. male with PMH reviewed below presents to the ED with lightheadedness on standing over the last 2 weeks.  Patient reports feeling some lightheadedness with standing but after taking a moment his symptoms resolved and he is able to walk normally without lightheadedness.  Is not experiencing fevers or chills.  No chest pain, palpitations, shortness of breath.  He initially thought that his symptoms were due to an antipsychotic medication he was taking from the [place] which he believes was quetiapine.  He cut the pill in half and then ultimately stopped taking it approximately 2 weeks ago but symptoms have persisted.  He states that he is eating and drinking well.  No other new medications added or taken away from his home medicines.  No radiation of symptoms or other modifying factors. Notes some baseline numbness in the bilateral hands but no new/worsening numbness/weakness.    Past Medical History:  Diagnosis Date  . Angio-edema   . Blepharospasm   . BPH (benign prostatic hyperplasia)   . Cervical myelopathy (HCC)   . Glaucoma 2021  . Hyperlipidemia   . Nephrolithiasis   . Optic neuropathy    Right  . Urticaria     Patient Active Problem List   Diagnosis Date Noted  . 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 Perennial allergic rhinitis 10/01/2016  . Chronic urticaria 10/01/2016  . Stiffness of joints, not elsewhere classified, multiple sites 06/07/2013  . Cervical pain (neck) 06/07/2013  . Spasm of muscle 06/07/2013    Past Surgical History:  Procedure Laterality Date  . NECK SURGERY  2015  . TONSILLECTOMY      Allergies Hornet venom, Wasp venom, and Yellow jacket venom [bee venom]  Family History  Problem Relation Age of Onset  . Heart attack Father   . Dementia Sister   . Arthritis  Sister   . Breast cancer Sister   . Healthy Son   . Healthy Daughter   . Allergic rhinitis Neg Hx   . Angioedema Neg Hx   . Asthma Neg Hx   . Atopy Neg Hx   . Eczema Neg Hx   . Immunodeficiency Neg Hx   . Urticaria Neg Hx     Social History Social History   Tobacco Use  . Smoking status: Current Some Day Smoker  . Smokeless tobacco: Never Used  Vaping Use  . Vaping Use: Never used  Substance Use Topics  . Alcohol use: No  . Drug use: No    Review of Systems  Constitutional: No fever/chills. Positive lightheadedness w/ standing.  Eyes: No visual changes. ENT: No sore throat. Cardiovascular: Denies chest pain. Respiratory: Denies shortness of breath. Gastrointestinal: No abdominal pain.  No nausea, no vomiting.  No diarrhea.  No constipation. Genitourinary: Negative for dysuria. Musculoskeletal: Negative for back pain. Skin: Negative for rash. Neurological: Negative for headaches, focal weakness or numbness.  10-point ROS otherwise negative.  ____________________________________________   PHYSICAL EXAM:  VITAL SIGNS: ED Triage Vitals  Enc Vitals Group     BP 03/27/20 1232 (!) 148/94     Pulse Rate 03/27/20 1232 92     Resp 03/27/20 1232 (!) 180     Temp 03/27/20 1232 98.9 F (37.2 C)     Temp src --      SpO2 03/27/20 1232  95 %     Weight 03/27/20 1229 220 lb 7.4 oz (100 kg)   Constitutional: Alert and oriented. Well appearing and in no acute distress. Eyes: Conjunctivae are normal. PERRL. EOMI. Head: Atraumatic. Nose: No congestion/rhinnorhea. Mouth/Throat: Mucous membranes are moist.  Oropharynx non-erythematous. Neck: No stridor.  Cardiovascular: Normal rate, regular rhythm. Good peripheral circulation. Grossly normal heart sounds.   Respiratory: Normal respiratory effort.  No retractions. Lungs CTAB. Gastrointestinal: Soft and nontender. No distention.  Musculoskeletal: No lower extremity tenderness nor edema. No gross deformities of  extremities. Neurologic:  Normal speech and language. 5/5 strength in the bilateral upper and lower extremities. Normal sensation. No facial asymmetry.  Skin:  Skin is warm, dry and intact. No rash noted.  ____________________________________________   LABS (all labs ordered are listed, but only abnormal results are displayed)  Labs Reviewed  RESP PANEL BY RT-PCR (FLU A&B, COVID) [id number]  COMPREHENSIVE METABOLIC PANEL  LIPASE, BLOOD  CBC WITH DIFFERENTIAL/PLATELET  TROPONIN I (HIGH SENSITIVITY)   ____________________________________________  EKG   EKG Interpretation  Date/Time:  Monday March 27 2020 12:34:09 EST Ventricular Rate:  88 PR Interval:    QRS Duration: 84 QT Interval:  344 QTC Calculation: 417 R Axis:   54 Text Interpretation: Sinus rhythm No STEMI Confirmed by Alona Bene [phone]) on 03/27/2020 12:55:43 PM       ____________________________________________  RADIOLOGY  DG Chest Portable 1 View  Result Date: 03/27/2020 CLINICAL DATA:  Chest pain. EXAM: PORTABLE CHEST 1 VIEW COMPARISON:  No prior. FINDINGS: Mediastinum and hilar structures normal. Heart size normal. Low lung volumes. Mild bilateral interstitial prominence. Mild pneumonitis cannot be excluded. No pleural effusion or pneumothorax. IMPRESSION: Low lung volumes. Mild bilateral interstitial prominence. Mild pneumonitis cannot be excluded. Electronically Signed   By: Maisie Fus  Register   On: 03/27/2020 13:36    ____________________________________________   PROCEDURES  Procedure(s) performed:   Procedures  None  ____________________________________________   INITIAL IMPRESSION / ASSESSMENT AND PLAN / ED COURSE  Pertinent labs & imaging results that were available during my care of the patient were reviewed by me and considered in my medical decision making (see chart for details).   Patient presents emergency department with lightheadedness with standing over the past 2 weeks.  No gait  instability other neuro deficits after symptoms resolved.  No active chest pain or heart palpitations.  There is an erroneous entry in the triage vital signs showing respiratory rate of 180 which is actually supposed to be 18.  No focal neuro deficits to suspect stroke.  Plan for IV fluids, orthostatic vitals, labs, chest x-ray, Covid testing, reassess.  Lab work along with chest x-ray reviewed with no acute findings.  Patient has normal orthostatic vitals here and is asymptomatic with standing after IV fluids.  Suspect dehydration clinically.  Discussed following with PCP to review his medication list and make changes as needed.  Encouraged him to not do this on his own at home.  EKG interpreted by me as above.  Discussed ED return precautions.  COVID negative.  ____________________________________________  FINAL CLINICAL IMPRESSION(S) / ED DIAGNOSES  Final diagnoses:  Lightheadedness  Dehydration  Constipation, unspecified constipation type     MEDICATIONS GIVEN DURING THIS VISIT:  Medications  sodium chloride 0.9 % bolus 500 mL (0 mLs Intravenous Stopped 03/27/20 1522)    Note:  This document was prepared using Dragon voice recognition software and may include unintentional dictation errors.  Alona Bene, MD, [facility]    Arleta Ostrum, Arlyss Repress,  MD 03/28/20 [phone]

## 2020-04-12 ENCOUNTER — Ambulatory Visit (INDEPENDENT_AMBULATORY_CARE_PROVIDER_SITE_OTHER): Payer: No Typology Code available for payment source

## 2020-04-12 ENCOUNTER — Other Ambulatory Visit: Payer: Self-pay

## 2020-04-12 DIAGNOSIS — Z91038 Other insect allergy status: Secondary | ICD-10-CM

## 2020-05-10 ENCOUNTER — Ambulatory Visit (INDEPENDENT_AMBULATORY_CARE_PROVIDER_SITE_OTHER): Payer: No Typology Code available for payment source

## 2020-05-10 ENCOUNTER — Other Ambulatory Visit: Payer: Self-pay

## 2020-05-10 DIAGNOSIS — Z91038 Other insect allergy status: Secondary | ICD-10-CM | POA: Diagnosis not present

## 2020-05-11 ENCOUNTER — Other Ambulatory Visit: Payer: Self-pay

## 2020-05-11 ENCOUNTER — Ambulatory Visit (INDEPENDENT_AMBULATORY_CARE_PROVIDER_SITE_OTHER): Payer: Medicare PPO | Admitting: Clinical

## 2020-05-11 DIAGNOSIS — F4310 Post-traumatic stress disorder, unspecified: Secondary | ICD-10-CM | POA: Diagnosis not present
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
## 2020-06-02 ENCOUNTER — Ambulatory Visit (INDEPENDENT_AMBULATORY_CARE_PROVIDER_SITE_OTHER): Payer: Medicare PPO | Admitting: Clinical

## 2020-06-02 ENCOUNTER — Other Ambulatory Visit: Payer: Self-pay

## 2020-06-02 DIAGNOSIS — F4310 Post-traumatic stress disorder, unspecified: Secondary | ICD-10-CM

## 2020-06-02 NOTE — Progress Notes (Signed)
Virtual Visit via Telephone Note  I connected with Randy [name] on 06/02/20 at 11:00 AM EST by telephone and verified that I am speaking with the correct person using two identifiers.  Location: Patient: Home Provider: Office   I discussed the limitations, risks, security and privacy concerns of performing an evaluation and management service by telephone and the availability of in person appointments. I also discussed with the patient that there may be a patient responsible charge related to this service. The patient expressed understanding and agreed to proceed.    THERAPIST PROGRESS NOTE   Session Time:11:00AM-11:45AM  Participation Level:Active  Behavioral Response:Casual andAlert, Sad  Type of Therapy:Individual Therapy  Treatment Goals addressed:ADHD  Interventions:CBT  Summary:Randy [name] [id number].malewho presents withPTSD.The OPT therapist worked with thepatientfor hisinitialOPT treatment. The OPT therapist utilized Motivational Interviewing to assist in creating therapeutic repore. The patient in the session was engaged and work in collaboration giving feedback about histriggers and symptoms over the past few weeks includinghis sister recently passing away.The OPT therapist utilized Cognitive Behavioral Therapy through cognitive restructuring as well as worked with the patient on coping strategies to assist in management of symptoms as well asreviewed sleep, eating habits, and general health including recent eye injections from [facility]..The patient identified positive interaction with his wife and her birthday coming up in the upcoming weekend and looking forward to going out with his wife to celebrate.  Suicidal/Homicidal:Nowithout intent/plan  Therapist Response:The OPT therapist worked with the patient for the patients scheduled session. The patient was engaged in hissession and gave feedback in relation to triggers, symptoms,  and behavior responses over the past fewweeks. The OPT therapist worked with the patient utilizing an in session Cognitive Behavioral Therapy exercise. The patient was responsive in the session and verbalized, "I am nervious about the potential need to go ahead and have eye surgery to improve my vision". The OPT therapist worked with patient on positive thinking and not catastrophzing.The OPT therapist worked with the patient providing ongoing psycho-educationfor the patient.The OPT therapist continued to support the patientswork to improveacademics.The OPT therapist will continue treatment work with the patient in his next scheduled session  Plan: Return again in2/3weeks.  Diagnosis:Axis I: PTSD Axis II:No diagnosis  I discussed the assessment and treatment plan with the patient. The patient was provided an opportunity to ask questions and all were answered. The patient agreed with the plan and demonstrated an understanding of the instructions.  The patient was advised to call back or seek an in-person evaluation if the symptoms worsen or if the condition fails to improve as anticipated.  I provided53minutes of non-face-to-face time during this encounter.  Winfred Burn, LCSW  06/02/2020

## 2020-06-07 ENCOUNTER — Ambulatory Visit: Payer: Self-pay

## 2020-06-09 ENCOUNTER — Other Ambulatory Visit: Payer: Self-pay

## 2020-06-09 ENCOUNTER — Ambulatory Visit (INDEPENDENT_AMBULATORY_CARE_PROVIDER_SITE_OTHER): Payer: No Typology Code available for payment source

## 2020-06-09 DIAGNOSIS — Z91038 Other insect allergy status: Secondary | ICD-10-CM

## 2020-06-22 ENCOUNTER — Ambulatory Visit (HOSPITAL_COMMUNITY): Payer: Medicare PPO | Admitting: Clinical

## 2020-06-22 ENCOUNTER — Other Ambulatory Visit: Payer: Self-pay

## 2020-07-07 ENCOUNTER — Ambulatory Visit: Payer: Self-pay

## 2020-07-11 ENCOUNTER — Ambulatory Visit (HOSPITAL_COMMUNITY): Payer: No Typology Code available for payment source | Attending: Nurse Practitioner

## 2020-07-11 ENCOUNTER — Encounter (HOSPITAL_COMMUNITY): Payer: Self-pay

## 2020-07-11 ENCOUNTER — Other Ambulatory Visit: Payer: Self-pay

## 2020-07-11 DIAGNOSIS — R262 Difficulty in walking, not elsewhere classified: Secondary | ICD-10-CM | POA: Diagnosis present

## 2020-07-11 DIAGNOSIS — G8929 Other chronic pain: Secondary | ICD-10-CM | POA: Diagnosis present

## 2020-07-11 DIAGNOSIS — M25562 Pain in left knee: Secondary | ICD-10-CM | POA: Diagnosis present

## 2020-07-11 NOTE — Therapy (Signed)
[facility] [address] [place], [place], [address] Phone: [phone]   Fax:  [phone]  Physical Therapy Evaluation  Patient Details  Name: Randy [name] MRN: [id number] Date of Birth: 08-01-1950 Referring Provider (PT): Alden Hipp, NP   Encounter Date: 07/11/2020   PT End of Session - 07/11/20 1018    Visit Number 1    Number of Visits 8    Date for PT Re-Evaluation 08/08/20    Authorization Type [facility]    Authorization - Visit Number 1    Authorization - Number of Visits 15    Progress Note Due on Visit 8    PT Start Time 1022    PT Stop Time 1115    PT Time Calculation (min) 53 min    Activity Tolerance Patient tolerated treatment well    Behavior During Therapy [facility] for tasks assessed/performed           Past Medical History:  Diagnosis Date  . Angio-edema   . Blepharospasm   . BPH (benign prostatic hyperplasia)   . Cervical myelopathy (HCC)   . Glaucoma 2021  . Hyperlipidemia   . Nephrolithiasis   . Optic neuropathy    Right  . Urticaria     Past Surgical History:  Procedure Laterality Date  . NECK SURGERY  2015  . TONSILLECTOMY      There were no vitals filed for this visit.    Subjective Assessment - 07/11/20 1020    Subjective Pt reports left knee popping and pain along anterior left knee and notes pain has been worsened since beginning of COVID where he reports a more sedentary lifestyle    How long can you sit comfortably? no issues    How long can you stand comfortably? 5-10 minutes before left knee aches    Diagnostic tests x-rays    Currently in Pain? Yes    Pain Score 5     Pain Location Knee    Pain Orientation Left    Pain Descriptors / Indicators Sharp    Pain Type Chronic pain    Pain Onset More than a month ago    Pain Frequency Intermittent    Aggravating Factors  walking, prlonged standing              [facility] PT Assessment - 07/11/20 0001      Assessment    Medical Diagnosis left knee pain    Referring Provider (PT) Alden Hipp, NP      Balance Screen   Has the patient fallen in the past 6 months No    Has the patient had a decrease in activity level because of a fear of falling?  No    Is the patient reluctant to leave their home because of a fear of falling?  No      Home Environment   Living Environment Private residence    Living Arrangements Spouse/significant other    Available Help at Discharge Family    Type of Home Apartment    Home Access Stairs to enter    Entrance Stairs-Number of Steps 14    Entrance Stairs-Rails Can reach both    Home Layout One level    Home Equipment [place] - single point      Prior Function   Level of Independence Independent   occasional use of cane   Leisure sedentary lifestyle      Observation/Other Assessments   Focus on Therapeutic Outcomes (FOTO)  46.5% function      ROM / Strength   AROM / PROM / Strength AROM;PROM;Strength      AROM   AROM Assessment Site Knee    Right/Left Knee Right;Left    Right Knee Extension 0    Right Knee Flexion 125    Left Knee Extension 0    Left Knee Flexion 125      Strength   Strength Assessment Site Knee    Right/Left Knee Right;Left    Right Knee Flexion 4/5    Right Knee Extension 5/5    Left Knee Flexion 5/5    Left Knee Extension 5/5      Flexibility   Soft Tissue Assessment /Muscle Length yes      Special Tests    Special Tests Knee Special Tests;Hip Special Tests    Hip Special Tests  [facility] Test;Ely's Test      [facility] Test    Findings Positive    Side Left      Ely's Test   Findings Positive    Side Left      Ambulation/Gait   Ambulation/Gait Yes    Ambulation/Gait Assistance 7: Independent    Ambulation Distance (Feet) 215 Feet    Assistive device None    Gait Pattern Within Functional Limits;Trunk flexed    Ambulation Surface Level    Gait Comments                      Objective measurements  completed on examination: See above findings.               PT Education - 07/11/20 1110    Education Details Pt education in assessment findings and benefits of regular physical activity to improve functional status    Person(s) Educated Patient    Methods Explanation;Handout    Comprehension Verbalized understanding            PT Short Term Goals - 07/11/20 1225      PT SHORT TERM GOAL #1   Title Patient will report at least 25% improvement in symptoms for improved quality of life.    Time 2    Period Weeks    Status New    Target Date 07/25/20      PT SHORT TERM GOAL #2   Title Manifest improved gait tolerance as evidenced by distance of 275 ft during    Baseline 200 ft    Time 2    Period Weeks    Status New    Target Date 07/25/20             PT Long Term Goals - 07/11/20 1226      PT LONG TERM GOAL #1   Title Patient will improve on FOTO score to meet predicted outcomes to improve functional independence    Baseline 46.5% function    Time 4    Period Weeks    Status New    Target Date 08/08/20      PT LONG TERM GOAL #2   Title Manifest improved flexibility by -Ely's Test in order to improve gait mechanics    Baseline + test, Left heel > 12 inches from buttocks    Time 4    Period Weeks    Status New    Target Date 08/08/20                  Plan - 07/11/20 1220    Clinical Impression Statement  Pt is 70 yo male who presents with left knee pain and reports increased pain with prolonged standing or activity.  Demonstrates reduced functional activity tolerance and deficits in flexibility. Pt wound benefit from PT services to increase LE flexibility, ambulation tolerance, and decrease pain to enable return to regular walking program    Personal Factors and Comorbidities Age;Time since onset of injury/illness/exacerbation    Examination-Activity Limitations Bend;Lift;Squat;Locomotion Level    Examination-Participation Restrictions  Cleaning;Community Activity    Stability/Clinical Decision Making Stable/Uncomplicated    Clinical Decision Making Low    Rehab Potential Good    PT Frequency 2x / week    PT Duration 4 weeks    PT Treatment/Interventions ADLs/Self Care Home Management;Aquatic Therapy;Canalith Repostioning;Cryotherapy;Electrical Stimulation;Ultrasound;Traction;Gait training;Stair training;Functional mobility training;Therapeutic activities;Therapeutic exercise;Balance training;Patient/family education;Neuromuscular re-education;Manual techniques;Taping;Dry needling;Spinal Manipulations;Joint Manipulations    PT Next Visit Plan back pain vs LE pain?    PT Home Exercise Plan prone stretch, Thomas test Nord[place] and Agree with Plan of Care Patient           Patient will benefit from skilled therapeutic intervention in order to improve the following deficits and impairments:  Decreased activity tolerance,Decreased mobility,Decreased endurance,Impaired flexibility,Impaired perceived functional ability,Pain,Improper body mechanics  Visit Diagnosis: Chronic pain of left knee  Difficulty in walking, not elsewhere classified     Problem List Patient Active Problem List   Diagnosis Date Noted  . 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 Perennial allergic rhinitis 10/01/2016  . Chronic urticaria 10/01/2016  . Stiffness of joints, not elsewhere classified, multiple sites 06/07/2013  . Cervical pain (neck) 06/07/2013  . Spasm of muscle 06/07/2013   12:29 PM, 07/11/20 M. Shary Decamp, PT, DPT Physical Therapist- [place] Office Number: [phone]  [facility] [address] [place], [place], [address] Phone: [phone]   Fax:  [phone]  Name: Orbin Mayeux MRN: [id number] Date of Birth: Apr 06, 1951

## 2020-07-11 NOTE — Patient Instructions (Signed)
Access Code: [email or web address] URL: [email or web address] Date: 07/11/2020 Prepared by: Shary Decamp  Exercises Prone Quadriceps Stretch with Strap - 1 x daily - 7 x weekly - 3 sets - 5 reps - 30-60 sec hold Thomas Stretch on Table - 1 x daily - 7 x weekly - 3 sets - 3-5 reps - 30-60 hold

## 2020-07-12 ENCOUNTER — Ambulatory Visit: Payer: Self-pay

## 2020-07-14 ENCOUNTER — Ambulatory Visit (INDEPENDENT_AMBULATORY_CARE_PROVIDER_SITE_OTHER): Payer: No Typology Code available for payment source

## 2020-07-14 DIAGNOSIS — Z91038 Other insect allergy status: Secondary | ICD-10-CM

## 2020-07-18 ENCOUNTER — Other Ambulatory Visit: Payer: Self-pay

## 2020-07-18 ENCOUNTER — Ambulatory Visit (HOSPITAL_COMMUNITY): Payer: No Typology Code available for payment source | Admitting: Physical Therapy

## 2020-07-18 ENCOUNTER — Encounter (HOSPITAL_COMMUNITY): Payer: Self-pay | Admitting: Physical Therapy

## 2020-07-18 DIAGNOSIS — M25562 Pain in left knee: Secondary | ICD-10-CM

## 2020-07-18 DIAGNOSIS — G8929 Other chronic pain: Secondary | ICD-10-CM

## 2020-07-18 DIAGNOSIS — R262 Difficulty in walking, not elsewhere classified: Secondary | ICD-10-CM

## 2020-07-18 NOTE — Patient Instructions (Signed)
Access Code: [email or web address] URL: [email or web address] Date: 07/18/2020 Prepared by: Greig Castilla Duvid Smalls  Exercises Supine Bridge - 1 x daily - 7 x weekly - 2 sets - 10 reps Standing Hip Abduction with Counter Support - 1 x daily - 7 x weekly - 2 sets - 10 reps

## 2020-07-18 NOTE — Therapy (Signed)
[facility] [address] [place], [place], [address] Phone: [phone]   Fax:  [phone]  Physical Therapy Treatment  Patient Details  Name: Randy [name] MRN: [id number] Date of Birth: 17-Dec-1950 Referring Provider (PT): Alden Hipp, NP   Encounter Date: 07/18/2020   PT End of Session - 07/18/20 1534    Visit Number 2    Number of Visits 8    Date for PT Re-Evaluation 08/08/20    Authorization Type [facility]    Authorization - Visit Number 2    Authorization - Number of Visits 15    Progress Note Due on Visit 8    PT Start Time 1535    PT Stop Time 1615    PT Time Calculation (min) 40 min    Activity Tolerance Patient tolerated treatment well    Behavior During Therapy [facility] for tasks assessed/performed           Past Medical History:  Diagnosis Date  . Angio-edema   . Blepharospasm   . BPH (benign prostatic hyperplasia)   . Cervical myelopathy (HCC)   . Glaucoma 2021  . Hyperlipidemia   . Nephrolithiasis   . Optic neuropathy    Right  . Urticaria     Past Surgical History:  Procedure Laterality Date  . NECK SURGERY  2015  . TONSILLECTOMY      There were no vitals filed for this visit.   Subjective Assessment - 07/18/20 1535    Subjective Patient states he tried some of the stretches but they made him a little stiff. He states his back is also messed up.    How long can you sit comfortably? no issues    How long can you stand comfortably? 5-10 minutes before left knee aches    Diagnostic tests x-rays    Pain Onset More than a month ago                             [facility] Adult PT Treatment/Exercise - 07/18/20 0001      Exercises   Exercises Knee/Hip      Knee/Hip Exercises: Stretches   Other Knee/Hip Stretches prone quad stretch 5x 20 seconds bilateral      Knee/Hip Exercises: Standing   Hip Abduction Both;2 sets;10 reps    Abduction Limitations cueing for posture       Knee/Hip Exercises: Supine   Bridges Both;2 sets;10 reps    Bridges Limitations with cueing for glute sets    Straight Leg Raises Both;2 sets;15 reps    Other Supine Knee/Hip Exercises glute set 5x 5 second                  PT Education - 07/18/20 1535    Education Details HEP, exercise mechanics    Person(s) Educated Patient    Methods Explanation;Demonstration    Comprehension Verbalized understanding;Returned demonstration            PT Short Term Goals - 07/11/20 1225      PT SHORT TERM GOAL #1   Title Patient will report at least 25% improvement in symptoms for improved quality of life.    Time 2    Period Weeks    Status New    Target Date 07/25/20      PT SHORT TERM GOAL #2   Title Manifest improved gait tolerance as evidenced by distance of 275 ft during  Baseline 200 ft    Time 2    Period Weeks    Status New    Target Date 07/25/20             PT Long Term Goals - 07/11/20 1226      PT LONG TERM GOAL #1   Title Patient will improve on FOTO score to meet predicted outcomes to improve functional independence    Baseline 46.5% function    Time 4    Period Weeks    Status New    Target Date 08/08/20      PT LONG TERM GOAL #2   Title Manifest improved flexibility by -Ely's Test in order to improve gait mechanics    Baseline + test, Left heel > 12 inches from buttocks    Time 4    Period Weeks    Status New    Target Date 08/08/20                 Plan - 07/18/20 1535    Clinical Impression Statement Began session with prone quad stretch as patient has not been completing at home. Patient requires cueing for completing glute sets initially with good carry over. Patient with very limited hip excursion initially with bridges which improves with reps. Began core and hip strengthening for proximal stability with SLR which patient given cueing for controlled eccentric movements. Patient fatigued at end of session. Patient will continue  to benefit from skilled physical therapy in order to reduce impairment and improve function.    Personal Factors and Comorbidities Age;Time since onset of injury/illness/exacerbation    Examination-Activity Limitations Bend;Lift;Squat;Locomotion Level    Examination-Participation Restrictions Cleaning;Community Activity    Stability/Clinical Decision Making Stable/Uncomplicated    Rehab Potential Good    PT Frequency 2x / week    PT Duration 4 weeks    PT Treatment/Interventions ADLs/Self Care Home Management;Aquatic Therapy;Canalith Repostioning;Cryotherapy;Electrical Stimulation;Ultrasound;Traction;Gait training;Stair training;Functional mobility training;Therapeutic activities;Therapeutic exercise;Balance training;Patient/family education;Neuromuscular re-education;Manual techniques;Taping;Dry needling;Spinal Manipulations;Joint Manipulations    PT Next Visit Plan back pain vs LE pain?    PT Home Exercise Plan prone stretch, Thomas test stretch 4/12 bridge, standing hip abduction    Consulted and Agree with Plan of Care Patient           Patient will benefit from skilled therapeutic intervention in order to improve the following deficits and impairments:  Decreased activity tolerance,Decreased mobility,Decreased endurance,Impaired flexibility,Impaired perceived functional ability,Pain,Improper body mechanics  Visit Diagnosis: Chronic pain of left knee  Difficulty in walking, not elsewhere classified     Problem List Patient Active Problem List   Diagnosis Date Noted  . 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 Perennial allergic rhinitis 10/01/2016  . Chronic urticaria 10/01/2016  . Stiffness of joints, not elsewhere classified, multiple sites 06/07/2013  . Cervical pain (neck) 06/07/2013  . Spasm of muscle 06/07/2013    4:21 PM, 07/18/20 Wyman Songster PT, DPT Physical Therapist at [facility]   [facility] [address] [place], [place], [address] Phone: [phone]   Fax:  [phone]  Name: Randy [name] MRN: [id number] Date of Birth: February 10, 1951

## 2020-07-19 ENCOUNTER — Encounter (HOSPITAL_COMMUNITY): Payer: Self-pay

## 2020-07-19 ENCOUNTER — Ambulatory Visit (HOSPITAL_COMMUNITY): Payer: No Typology Code available for payment source

## 2020-07-19 DIAGNOSIS — G8929 Other chronic pain: Secondary | ICD-10-CM

## 2020-07-19 DIAGNOSIS — M25562 Pain in left knee: Secondary | ICD-10-CM | POA: Diagnosis not present

## 2020-07-19 DIAGNOSIS — R262 Difficulty in walking, not elsewhere classified: Secondary | ICD-10-CM

## 2020-07-19 NOTE — Therapy (Addendum)
[facility] [address] [place], [place], [address] Phone: [phone]   Fax:  [phone]  Physical Therapy Treatment  Patient Details  Name: Randy [name] MRN: [id number] Date of Birth: 03-23-1951 Referring Provider (PT): Alden Hipp, NP   Encounter Date: 07/19/2020   PT End of Session - 07/19/20 1006    Visit Number 3    Number of Visits 8    Date for PT Re-Evaluation 08/08/20    Authorization Type [facility] community [facility]    Authorization - Visit Number 3    Authorization - Number of Visits 15    PT Start Time [phone]    PT Stop Time 1041    PT Time Calculation (min) 43 min    Activity Tolerance Patient tolerated treatment well    Behavior During Therapy [facility] for tasks assessed/performed           Past Medical History:  Diagnosis Date  . Angio-edema   . Blepharospasm   . BPH (benign prostatic hyperplasia)   . Cervical myelopathy (HCC)   . Glaucoma 2021  . Hyperlipidemia   . Nephrolithiasis   . Optic neuropathy    Right  . Urticaria     Past Surgical History:  Procedure Laterality Date  . NECK SURGERY  2015  . TONSILLECTOMY      There were no vitals filed for this visit.   Subjective Assessment - 07/19/20 1002    Subjective Pt arrived wiht heart monitor, stated he is to wear for 2 weeks (began last week).  Pt reoprts some LBP Rt side related to kidney stones, has MRI and CTscan scheduled in [place] on 07/21/20.   Lt knee sharp pain is periodic, has taken medication prior session today.    Currently in Pain? Yes    Pain Score 4     Pain Location Back    Pain Orientation Lower;Right    Pain Type Chronic pain    Pain Onset More than a month ago    Pain Frequency Intermittent    Aggravating Factors  kidney stone    Multiple Pain Sites Yes    Pain Score 3    Pain Location Knee    Pain Orientation Left    Pain Descriptors / Indicators Sharp    Pain Type Chronic pain    Pain Onset More than a month ago    Pain  Frequency Intermittent    Aggravating Factors  walking, prolonged weight bearing                             [facility] Adult PT Treatment/Exercise - 07/19/20 0001      Posture/Postural Control   Posture/Postural Control Postural limitations    Postural Limitations Flexed trunk      Exercises   Exercises Knee/Hip      Knee/Hip Exercises: Stretches   Hip Flexor Stretch 5 reps;10 seconds;Both    Hip Flexor Stretch Limitations 12in step height active hip flexor stretch    Other Knee/Hip Stretches Thomas stretch on edge of bed with Rt SKTC 3x 30"      Knee/Hip Exercises: Standing   Heel Raises 10 reps    Functional Squat 10 reps    Functional Squat Limitations 3D hip excursion for mobility (lateral weight shift, squat mechanics front of chair, lumbar extension, rotation)    Gait Training Cueing for posture and encouraged to stand erect with hips forward.  Knee/Hip Exercises: Supine   Bridges Both;2 sets;10 reps    Bridges Limitations with cueing for glute sets                    PT Short Term Goals - 07/11/20 1225      PT SHORT TERM GOAL #1   Title Patient will report at least 25% improvement in symptoms for improved quality of life.    Time 2    Period Weeks    Status New    Target Date 07/25/20      PT SHORT TERM GOAL #2   Title Manifest improved gait tolerance as evidenced by distance of 275 ft during    Baseline 200 ft    Time 2    Period Weeks    Status New    Target Date 07/25/20             PT Long Term Goals - 07/11/20 1226      PT LONG TERM GOAL #1   Title Patient will improve on FOTO score to meet predicted outcomes to improve functional independence    Baseline 46.5% function    Time 4    Period Weeks    Status New    Target Date 08/08/20      PT LONG TERM GOAL #2   Title Manifest improved flexibility by -Ely's Test in order to improve gait mechanics    Baseline + test, Left heel > 12 inches from buttocks    Time  4    Period Weeks    Status New    Target Date 08/08/20                 Plan - 07/19/20 1019    Clinical Impression Statement Added mobility exercises and standing stretches to address hip flexor tightness.  Held prone exercises this session due to heart monitor per pt request.  Pt tolerated well to session with no reports of increased pain.  Did require verbal and tactile cueing to improve awareness of posture and proper form with new exercises.  Periodic seated rest breaks required due to fatigue with activities.    Personal Factors and Comorbidities Age;Time since onset of injury/illness/exacerbation    Examination-Activity Limitations Bend;Lift;Squat;Locomotion Level    Examination-Participation Restrictions Cleaning;Community Activity    Stability/Clinical Decision Making Stable/Uncomplicated    Clinical Decision Making Low    Rehab Potential Good    PT Frequency 2x / week    PT Duration 4 weeks    PT Treatment/Interventions ADLs/Self Care Home Management;Aquatic Therapy;Canalith Repostioning;Cryotherapy;Electrical Stimulation;Ultrasound;Traction;Gait training;Stair training;Functional mobility training;Therapeutic activities;Therapeutic exercise;Balance training;Patient/family education;Neuromuscular re-education;Manual techniques;Taping;Dry needling;Spinal Manipulations;Joint Manipulations    PT Next Visit Plan hip flexor stretches, functional strengthening    PT Home Exercise Plan prone stretch, Thomas test stretch 4/12 bridge, standing hip abduction    Consulted and Agree with Plan of Care Patient           Patient will benefit from skilled therapeutic intervention in order to improve the following deficits and impairments:  Decreased activity tolerance,Decreased mobility,Decreased endurance,Impaired flexibility,Impaired perceived functional ability,Pain,Improper body mechanics  Visit Diagnosis: Difficulty in walking, not elsewhere classified  Chronic pain of left  knee     Problem List Patient Active Problem List   Diagnosis Date Noted  . 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 Perennial allergic rhinitis 10/01/2016  . Chronic urticaria 10/01/2016  . Stiffness of joints, not elsewhere classified, multiple sites 06/07/2013  . Cervical pain (  neck) 06/07/2013  . Spasm of muscle 06/07/2013   Becky Sax, LPTA/CLT; CBIS [phone]  Juel Burrow 07/19/2020, 10:46 AM  [facility] [address] [place], [place], [address] Phone: [phone]   Fax:  [phone]  Name: Randy [name] MRN: [id number] Date of Birth: [birth year]

## 2020-07-24 ENCOUNTER — Other Ambulatory Visit: Payer: Self-pay

## 2020-07-24 ENCOUNTER — Ambulatory Visit (HOSPITAL_COMMUNITY): Payer: No Typology Code available for payment source | Admitting: Physical Therapy

## 2020-07-24 DIAGNOSIS — M25562 Pain in left knee: Secondary | ICD-10-CM | POA: Diagnosis not present

## 2020-07-24 DIAGNOSIS — G8929 Other chronic pain: Secondary | ICD-10-CM

## 2020-07-24 DIAGNOSIS — R262 Difficulty in walking, not elsewhere classified: Secondary | ICD-10-CM

## 2020-07-24 NOTE — Patient Instructions (Signed)
Access Code: [email or web address] URL: [email or web address] Date: 07/24/2020 Prepared by: Georges Lynch  Exercises Supine Transversus Abdominis Bracing - Hands on Stomach - 3 x daily - 7 x weekly - 2 sets - 10 reps - 5 sec hold

## 2020-07-24 NOTE — Therapy (Signed)
[facility] [address] [place], [place], [address] Phone: [phone]   Fax:  [phone]  Physical Therapy Treatment  Patient Details  Name: Nehemyah Foushee MRN: [id number] Date of Birth: May 27, 1950 Referring Provider (PT): Alden Hipp, NP   Encounter Date: 07/24/2020   PT End of Session - 07/24/20 0911    Visit Number 4    Number of Visits 8    Date for PT Re-Evaluation 08/08/20    Authorization Type [facility] community [facility]    Authorization - Visit Number 4    Authorization - Number of Visits 15    PT Start Time 0905    PT Stop Time 0955    PT Time Calculation (min) 50 min    Activity Tolerance Patient tolerated treatment well    Behavior During Therapy [facility] for tasks assessed/performed           Past Medical History:  Diagnosis Date  . Angio-edema   . Blepharospasm   . BPH (benign prostatic hyperplasia)   . Cervical myelopathy (HCC)   . Glaucoma 2021  . Hyperlipidemia   . Nephrolithiasis   . Optic neuropathy    Right  . Urticaria     Past Surgical History:  Procedure Laterality Date  . NECK SURGERY  2015  . TONSILLECTOMY      There were no vitals filed for this visit.   Subjective Assessment - 07/24/20 0907    Subjective Patient says he is doing alright today. Not too bad. "about a 4"    Currently in Pain? Yes    Pain Score 4     Pain Location Back    Pain Orientation Lower;Posterior    Pain Descriptors / Indicators Sharp    Pain Type Chronic pain    Pain Onset More than a month ago    Pain Frequency Intermittent    Pain Onset More than a month ago                             [facility] Adult PT Treatment/Exercise - 07/24/20 0001      Exercises   Exercises Lumbar      Lumbar Exercises: Stretches   Hip Flexor Stretch Right;Left;2 reps;30 seconds    Hip Flexor Stretch Limitations thomas stretch      Lumbar Exercises: Supine   Ab Set 10 reps    Bridge 20 reps      Knee/Hip Exercises:  Stretches   Hip Flexor Stretch 5 reps;10 seconds;Both    Hip Flexor Stretch Limitations 12in step height active hip flexor stretch    Gastroc Stretch Both;3 reps;20 seconds    Gastroc Stretch Limitations slant board      Knee/Hip Exercises: Standing   Heel Raises Both;2 sets;10 reps    Hip Abduction Both;2 sets;10 reps    Hip Extension Both;2 sets;10 reps      Knee/Hip Exercises: Seated   Sit to Sand 10 reps;without UE support   cues for eccentric lowering                   PT Short Term Goals - 07/11/20 1225      PT SHORT TERM GOAL #1   Title Patient will report at least 25% improvement in symptoms for improved quality of life.    Time 2    Period Weeks    Status New    Target Date 07/25/20      PT  SHORT TERM GOAL #2   Title Manifest improved gait tolerance as evidenced by distance of 275 ft during    Baseline 200 ft    Time 2    Period Weeks    Status New    Target Date 07/25/20             PT Long Term Goals - 07/11/20 1226      PT LONG TERM GOAL #1   Title Patient will improve on FOTO score to meet predicted outcomes to improve functional independence    Baseline 46.5% function    Time 4    Period Weeks    Status New    Target Date 08/08/20      PT LONG TERM GOAL #2   Title Manifest improved flexibility by -Ely's Test in order to improve gait mechanics    Baseline + test, Left heel > 12 inches from buttocks    Time 4    Period Weeks    Status New    Target Date 08/08/20                 Plan - 07/24/20 1700    Clinical Impression Statement Patient tolerated session well today. He continues to be limited by postural deficits, requiring verbal cues throughout visit for upright positioning. Added core activation exercise to assist with this. Issued HEP handout. Patient will continue to benefit from skilled therapy services to decrease limitations and improve functional ability.    Personal Factors and Comorbidities Age;Time since onset  of injury/illness/exacerbation    Examination-Activity Limitations Bend;Lift;Squat;Locomotion Level    Examination-Participation Restrictions Cleaning;Community Activity    Stability/Clinical Decision Making Stable/Uncomplicated    Rehab Potential Good    PT Frequency 2x / week    PT Duration 4 weeks    PT Treatment/Interventions ADLs/Self Care Home Management;Aquatic Therapy;Canalith Repostioning;Cryotherapy;Electrical Stimulation;Ultrasound;Traction;Gait training;Stair training;Functional mobility training;Therapeutic activities;Therapeutic exercise;Balance training;Patient/family education;Neuromuscular re-education;Manual techniques;Taping;Dry needling;Spinal Manipulations;Joint Manipulations    PT Next Visit Plan hip flexor stretches, functional strengthening. Continue with core progression. Add bent knee marching.    PT Home Exercise Plan prone stretch, Thomas test stretch 4/12 bridge, standing hip abduction 4/18 ab set    Consulted and Agree with Plan of Care Patient           Patient will benefit from skilled therapeutic intervention in order to improve the following deficits and impairments:  Decreased activity tolerance,Decreased mobility,Decreased endurance,Impaired flexibility,Impaired perceived functional ability,Pain,Improper body mechanics  Visit Diagnosis: Difficulty in walking, not elsewhere classified  Chronic pain of left knee     Problem List Patient Active Problem List   Diagnosis Date Noted  . 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 Perennial allergic rhinitis 10/01/2016  . Chronic urticaria 10/01/2016  . Stiffness of joints, not elsewhere classified, multiple sites 06/07/2013  . Cervical pain (neck) 06/07/2013  . Spasm of muscle 06/07/2013   5:22 PM, 07/24/20 Georges Lynch PT DPT  Physical Therapist with [facility]  [facility]  [phone]   [facility] [address]  [place], [place], [address] Phone: [phone]   Fax:  [phone]  Name: Abbott Jasinski MRN: [id number] Date of Birth: Oct 07, 1950

## 2020-07-26 ENCOUNTER — Encounter (HOSPITAL_COMMUNITY): Payer: Self-pay

## 2020-07-26 ENCOUNTER — Ambulatory Visit (HOSPITAL_COMMUNITY): Payer: No Typology Code available for payment source

## 2020-07-26 ENCOUNTER — Other Ambulatory Visit: Payer: Self-pay

## 2020-07-26 DIAGNOSIS — M25562 Pain in left knee: Secondary | ICD-10-CM | POA: Diagnosis not present

## 2020-07-26 DIAGNOSIS — G8929 Other chronic pain: Secondary | ICD-10-CM

## 2020-07-26 DIAGNOSIS — R262 Difficulty in walking, not elsewhere classified: Secondary | ICD-10-CM

## 2020-07-26 NOTE — Therapy (Signed)
[facility] [address] [place], [place], [address] Phone: [phone]   Fax:  [phone]  Physical Therapy Treatment  Patient Details  Name: Randy [name] MRN: [id number] Date of Birth: 1950/04/17 Referring Provider (PT): Alden Hipp, NP   Encounter Date: 07/26/2020   PT End of Session - 07/26/20 0842    Visit Number 5    Number of Visits 8    Date for PT Re-Evaluation 08/08/20    [facility]    Authorization - Visit Number 5    Authorization - Number of Visits 15    Progress Note Due on Visit 8    PT Start Time 0828    PT Stop Time 0918    PT Time Calculation (min) 50 min    Activity Tolerance Patient tolerated treatment well    Behavior During Therapy [facility] for tasks assessed/performed           Past Medical History:  Diagnosis Date  . Angio-edema   . Blepharospasm   . BPH (benign prostatic hyperplasia)   . Cervical myelopathy (HCC)   . Glaucoma 2021  . Hyperlipidemia   . Nephrolithiasis   . Optic neuropathy    Right  . Urticaria     Past Surgical History:  Procedure Laterality Date  . NECK SURGERY  2015  . TONSILLECTOMY      There were no vitals filed for this visit.   Subjective Assessment - 07/26/20 0831    Subjective Pt reports soreness in knee and lower back today    Currently in Pain? Yes    Pain Score 3     Pain Location Knee    Pain Orientation Left;Anterior    Pain Descriptors / Indicators Sore    Pain Type Chronic pain    Pain Onset More than a month ago    Pain Frequency Intermittent    Aggravating Factors  walking, prolonged weight bearing    Multiple Pain Sites Yes    Pain Score 5    Pain Location Back    Pain Orientation Lower    Pain Descriptors / Indicators Sore    Pain Type Chronic pain    Pain Onset More than a month ago    Pain Frequency Intermittent    Aggravating Factors  kidney stone              [facility] PT Assessment - 07/26/20 0001       Assessment   Medical Diagnosis left knee pain    Referring Provider (PT) Alden Hipp, NP    Hand Dominance Right    [facility]      Posture/Postural Control   Posture/Postural Control Postural limitations    Postural Limitations Flexed trunk                         [facility] Adult PT Treatment/Exercise - 07/26/20 0001      Bed Mobility   Bed Mobility Sit to Sidelying Right    Sit to Sidelying Right Supervision/Verbal cueing   Educated low rolling     Exercises   Exercises Lumbar      Lumbar Exercises: Stretches   Quad Stretch 3 reps;30 seconds    Quad Stretch Limitations prone with rope      Lumbar Exercises: Supine   Ab Set 10 reps;5 seconds    AB Set Limitations tactile cueing, verbal cueing for breathing  Pelvic Tilt 10 reps;5 seconds    Pelvic Tilt Limitations posterior tilt, verbal and tactile cueing    Bent Knee Raise 10 reps;5 seconds    Bent Knee Raise Limitations with ab set    Bridge 20 reps;3 seconds      Lumbar Exercises: Prone   Straight Leg Raise 5 reps;3 seconds                    PT Short Term Goals - 07/11/20 1225      PT SHORT TERM GOAL #1   Title Patient will report at least 25% improvement in symptoms for improved quality of life.    Time 2    Period Weeks    Status New    Target Date 07/25/20      PT SHORT TERM GOAL #2   Title Manifest improved gait tolerance as evidenced by distance of 275 ft during    Baseline 200 ft    Time 2    Period Weeks    Status New    Target Date 07/25/20             PT Long Term Goals - 07/11/20 1226      PT LONG TERM GOAL #1   Title Patient will improve on FOTO score to meet predicted outcomes to improve functional independence    Baseline 46.5% function    Time 4    Period Weeks    Status New    Target Date 08/08/20      PT LONG TERM GOAL #2   Title Manifest improved flexibility by -Ely's Test in order to improve gait mechanics     Baseline + test, Left heel > 12 inches from buttocks    Time 4    Period Weeks    Status New    Target Date 08/08/20                 Plan - 07/26/20 0843    Clinical Impression Statement Added posterior pelvic tilt to improve spinal alignment for postural improvements and pain control and prone SLR for gluteal strengthening.  Pt educated on importance of abdominal activation for pain control and lumbar support, verbal and tactile cueing for proper mechanics and to breath through session.  Cueing for posture awareness through session.  EOS no reoprts of pain.    Personal Factors and Comorbidities Age;Time since onset of injury/illness/exacerbation    Examination-Activity Limitations Bend;Lift;Squat;Locomotion Level    Examination-Participation Restrictions Cleaning;Community Activity    Stability/Clinical Decision Making Stable/Uncomplicated    Clinical Decision Making Low    Rehab Potential Good    PT Frequency 2x / week    PT Duration 4 weeks    PT Treatment/Interventions ADLs/Self Care Home Management;Aquatic Therapy;Canalith Repostioning;Cryotherapy;Electrical Stimulation;Ultrasound;Traction;Gait training;Stair training;Functional mobility training;Therapeutic activities;Therapeutic exercise;Balance training;Patient/family education;Neuromuscular re-education;Manual techniques;Taping;Dry needling;Spinal Manipulations;Joint Manipulations    PT Next Visit Plan hip flexor stretches, functional strengthening. Continue with core progression and prone hip extension.  Possible progress to theraband postural strenghtening, paloff, POE...    PT Home Exercise Plan prone stretch, Thomas test stretch 4/12 bridge, standing hip abduction 4/18 ab set    Consulted and Agree with Plan of Care Patient           Patient will benefit from skilled therapeutic intervention in order to improve the following deficits and impairments:  Decreased activity tolerance,Decreased mobility,Decreased  endurance,Impaired flexibility,Impaired perceived functional ability,Pain,Improper body mechanics  Visit Diagnosis: Chronic pain of left knee  Difficulty in  walking, not elsewhere classified     Problem List Patient Active Problem List   Diagnosis Date Noted  . 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 Perennial allergic rhinitis 10/01/2016  . Chronic urticaria 10/01/2016  . Stiffness of joints, not elsewhere classified, multiple sites 06/07/2013  . Cervical pain (neck) 06/07/2013  . Spasm of muscle 06/07/2013    Becky Sax, LPTA/CLT; CBIS [phone]  Juel Burrow 07/26/2020, 10:20 AM  [facility] [address] [place], [place], [address] Phone: [phone]   Fax:  [phone]  Name: Kaimen Peine MRN: [id number] Date of Birth: 14-Dec-1950

## 2020-08-01 ENCOUNTER — Encounter (HOSPITAL_COMMUNITY): Payer: Self-pay | Admitting: Physical Therapy

## 2020-08-01 ENCOUNTER — Other Ambulatory Visit: Payer: Self-pay

## 2020-08-01 ENCOUNTER — Ambulatory Visit (HOSPITAL_COMMUNITY): Payer: No Typology Code available for payment source | Admitting: Physical Therapy

## 2020-08-01 DIAGNOSIS — G8929 Other chronic pain: Secondary | ICD-10-CM

## 2020-08-01 DIAGNOSIS — R262 Difficulty in walking, not elsewhere classified: Secondary | ICD-10-CM

## 2020-08-01 DIAGNOSIS — M25562 Pain in left knee: Secondary | ICD-10-CM

## 2020-08-01 NOTE — Therapy (Signed)
[facility] [address] [place], [place], [address] Phone: [phone]   Fax:  [phone]  Physical Therapy Treatment  Patient Details  Name: Randy [name] MRN: [id number] Date of Birth: September 19, 1950 Referring Provider (PT): Alden Hipp, NP   Encounter Date: 08/01/2020   PT End of Session - 08/01/20 1042    Visit Number 6    Number of Visits 8    Date for PT Re-Evaluation 08/08/20    [facility]    Authorization - Visit Number 6    Authorization - Number of Visits 15    Progress Note Due on Visit 8    PT Start Time 1036    PT Stop Time 1120    PT Time Calculation (min) 44 min    Activity Tolerance Patient tolerated treatment well    Behavior During Therapy [facility] for tasks assessed/performed           Past Medical History:  Diagnosis Date  . Angio-edema   . Blepharospasm   . BPH (benign prostatic hyperplasia)   . Cervical myelopathy (HCC)   . Glaucoma 2021  . Hyperlipidemia   . Nephrolithiasis   . Optic neuropathy    Right  . Urticaria     Past Surgical History:  Procedure Laterality Date  . NECK SURGERY  2015  . TONSILLECTOMY      There were no vitals filed for this visit.   Subjective Assessment - 08/01/20 1040    Subjective Patient says he feels pretty good. He was a little sore after last visit.    Currently in Pain? Yes    Pain Score 4     Pain Location Back    Pain Orientation Lower;Posterior    Pain Descriptors / Indicators Sharp    Pain Type Chronic pain    Pain Onset More than a month ago    Pain Frequency Intermittent    Pain Onset More than a month ago                             [facility] Adult PT Treatment/Exercise - 08/01/20 0001      Lumbar Exercises: Standing   Row Both;20 reps;Theraband    Theraband Level (Row) Level 3 (Green)    Shoulder Extension Both;20 reps;Theraband    Theraband Level (Shoulder Extension) Level 3 (Green)    Other Standing  Lumbar Exercises palloff press GTB x20 each      Lumbar Exercises: Supine   Ab Set 15 reps;5 seconds    Pelvic Tilt 15 reps;5 seconds    Bent Knee Raise 20 reps    Bridge 20 reps    Straight Leg Raise 20 reps                    PT Short Term Goals - 07/11/20 1225      PT SHORT TERM GOAL #1   Title Patient will report at least 25% improvement in symptoms for improved quality of life.    Time 2    Period Weeks    Status New    Target Date 07/25/20      PT SHORT TERM GOAL #2   Title Manifest improved gait tolerance as evidenced by distance of 275 ft during    Baseline 200 ft    Time 2    Period Weeks    Status New    Target Date 07/25/20  PT Long Term Goals - 07/11/20 1226      PT LONG TERM GOAL #1   Title Patient will improve on FOTO score to meet predicted outcomes to improve functional independence    Baseline 46.5% function    Time 4    Period Weeks    Status New    Target Date 08/08/20      PT LONG TERM GOAL #2   Title Manifest improved flexibility by -Ely's Test in order to improve gait mechanics    Baseline + test, Left heel > 12 inches from buttocks    Time 4    Period Weeks    Status New    Target Date 08/08/20                 Plan - 08/01/20 1137    Clinical Impression Statement Patient tolerated session well today. Continues to have difficulty performing pelvic tilts due to lumbar mobility. Somewhat improved with verbal and tactile cueing. Progressed standing postural strengthening exercise with added band rows and extensions. Patient will continue to benefit from skilled therapy services to reduce deficits and improve functional activity.    Personal Factors and Comorbidities Age;Time since onset of injury/illness/exacerbation    Examination-Activity Limitations Bend;Lift;Squat;Locomotion Level    Examination-Participation Restrictions Cleaning;Community Activity    Stability/Clinical Decision Making  Stable/Uncomplicated    Rehab Potential Good    PT Frequency 2x / week    PT Duration 4 weeks    PT Treatment/Interventions ADLs/Self Care Home Management;Aquatic Therapy;Canalith Repostioning;Cryotherapy;Electrical Stimulation;Ultrasound;Traction;Gait training;Stair training;Functional mobility training;Therapeutic activities;Therapeutic exercise;Balance training;Patient/family education;Neuromuscular re-education;Manual techniques;Taping;Dry needling;Spinal Manipulations;Joint Manipulations    PT Next Visit Plan hip flexor stretches, functional strengthening. Continue with core progression and prone hip extension.  Possible progress to theraband postural strenghtening, paloff, POE...    PT Home Exercise Plan prone stretch, Thomas test stretch 4/12 bridge, standing hip abduction 4/18 ab set    Consulted and Agree with Plan of Care Patient           Patient will benefit from skilled therapeutic intervention in order to improve the following deficits and impairments:  Decreased activity tolerance,Decreased mobility,Decreased endurance,Impaired flexibility,Impaired perceived functional ability,Pain,Improper body mechanics  Visit Diagnosis: Chronic pain of left knee  Difficulty in walking, not elsewhere classified     Problem List Patient Active Problem List   Diagnosis Date Noted  . 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 Perennial allergic rhinitis 10/01/2016  . Chronic urticaria 10/01/2016  . Stiffness of joints, not elsewhere classified, multiple sites 06/07/2013  . Cervical pain (neck) 06/07/2013  . Spasm of muscle 06/07/2013   11:38 AM, 08/01/20 Georges Lynch PT DPT  Physical Therapist with [facility]  [facility]  [phone]   [facility] [address] [place], [place], [address] Phone: [phone]   Fax:  [phone]  Name: Zakariya Knickerbocker MRN: [id number] Date of Birth: 05-09-1950

## 2020-08-01 NOTE — Patient Instructions (Signed)
Access Code: [email or web address] URL: [email or web address] Date: 08/01/2020 Prepared by: Georges Lynch  Exercises Supine Posterior Pelvic Tilt - 2 x daily - 7 x weekly - 2 sets - 10 reps - 3 second hold Supine March - 2 x daily - 7 x weekly - 2 sets - 10 reps

## 2020-08-03 ENCOUNTER — Encounter (HOSPITAL_COMMUNITY): Payer: No Typology Code available for payment source | Admitting: Physical Therapy

## 2020-08-07 ENCOUNTER — Ambulatory Visit (HOSPITAL_COMMUNITY): Payer: No Typology Code available for payment source | Admitting: Physical Therapy

## 2020-08-09 ENCOUNTER — Ambulatory Visit (HOSPITAL_COMMUNITY): Payer: No Typology Code available for payment source

## 2020-08-11 ENCOUNTER — Ambulatory Visit: Payer: Self-pay

## 2020-08-18 ENCOUNTER — Other Ambulatory Visit: Payer: Self-pay

## 2020-08-18 ENCOUNTER — Ambulatory Visit (INDEPENDENT_AMBULATORY_CARE_PROVIDER_SITE_OTHER): Payer: No Typology Code available for payment source

## 2020-08-18 DIAGNOSIS — Z91038 Other insect allergy status: Secondary | ICD-10-CM

## 2020-09-19 ENCOUNTER — Encounter (HOSPITAL_COMMUNITY): Payer: Self-pay

## 2020-09-19 NOTE — Therapy (Signed)
[facility] [address] [place], [place], [address] Phone: [phone]   Fax:  [phone]  Patient Details  Name: Randy [name] MRN: [id number] Date of Birth: 11/28/1950 Referring Provider:  No ref. provider found  Encounter Date: 09/19/2020  PHYSICAL THERAPY DISCHARGE SUMMARY  Visits from Start of Care: 6  Current functional level related to goals / functional outcomes: Unable to reassess, did not return after last visit   Remaining deficits: Unable to assess   Education / Equipment: HEP   Patient agrees to discharge. Patient goals were partially met. Patient is being discharged due to not returning since the last visit.  Mason K Halpin 09/19/2020, 1:30 PM  [facility] [address] , [place], [address] Phone: [phone]   Fax:  [phone] 

## 2020-09-29 ENCOUNTER — Other Ambulatory Visit: Payer: Self-pay

## 2020-09-29 ENCOUNTER — Ambulatory Visit (INDEPENDENT_AMBULATORY_CARE_PROVIDER_SITE_OTHER): Payer: No Typology Code available for payment source

## 2020-09-29 DIAGNOSIS — Z91038 Other insect allergy status: Secondary | ICD-10-CM | POA: Diagnosis not present

## 2020-10-12 ENCOUNTER — Telehealth: Payer: Self-pay | Admitting: Allergy & Immunology

## 2020-10-12 NOTE — Telephone Encounter (Signed)
New request for service was faxed to [facility], [phone].

## 2020-10-24 NOTE — Telephone Encounter (Signed)
[facility] to check on the status of new authorization, was told that a new request for an authorization was not on file. Refaxed request for service and will call patient to let him know to call the [facility] to speed up the process.

## 2020-10-27 ENCOUNTER — Ambulatory Visit: Payer: Self-pay

## 2020-11-03 ENCOUNTER — Ambulatory Visit (INDEPENDENT_AMBULATORY_CARE_PROVIDER_SITE_OTHER): Payer: No Typology Code available for payment source

## 2020-11-03 ENCOUNTER — Other Ambulatory Visit: Payer: Self-pay

## 2020-11-03 DIAGNOSIS — Z91038 Other insect allergy status: Secondary | ICD-10-CM

## 2020-12-01 ENCOUNTER — Other Ambulatory Visit: Payer: Self-pay

## 2020-12-01 ENCOUNTER — Ambulatory Visit (INDEPENDENT_AMBULATORY_CARE_PROVIDER_SITE_OTHER): Payer: No Typology Code available for payment source

## 2020-12-01 DIAGNOSIS — Z91038 Other insect allergy status: Secondary | ICD-10-CM | POA: Diagnosis not present

## 2020-12-29 ENCOUNTER — Ambulatory Visit (INDEPENDENT_AMBULATORY_CARE_PROVIDER_SITE_OTHER): Payer: No Typology Code available for payment source

## 2020-12-29 ENCOUNTER — Other Ambulatory Visit: Payer: Self-pay

## 2020-12-29 DIAGNOSIS — Z91038 Other insect allergy status: Secondary | ICD-10-CM

## 2021-01-26 ENCOUNTER — Ambulatory Visit (INDEPENDENT_AMBULATORY_CARE_PROVIDER_SITE_OTHER): Payer: No Typology Code available for payment source

## 2021-01-26 ENCOUNTER — Other Ambulatory Visit: Payer: Self-pay

## 2021-01-26 DIAGNOSIS — Z91038 Other insect allergy status: Secondary | ICD-10-CM | POA: Diagnosis not present

## 2021-02-23 ENCOUNTER — Other Ambulatory Visit: Payer: Self-pay

## 2021-02-23 ENCOUNTER — Ambulatory Visit (INDEPENDENT_AMBULATORY_CARE_PROVIDER_SITE_OTHER): Payer: No Typology Code available for payment source

## 2021-02-23 DIAGNOSIS — Z91038 Other insect allergy status: Secondary | ICD-10-CM | POA: Diagnosis not present

## 2021-02-28 IMAGING — DX DG CHEST 1V PORT
1 series · 1 of 1 positions shown · non-contrast
Comparison: No prior.

CLINICAL DATA: Chest pain.

EXAM:
PORTABLE CHEST 1 VIEW

[chest ap]
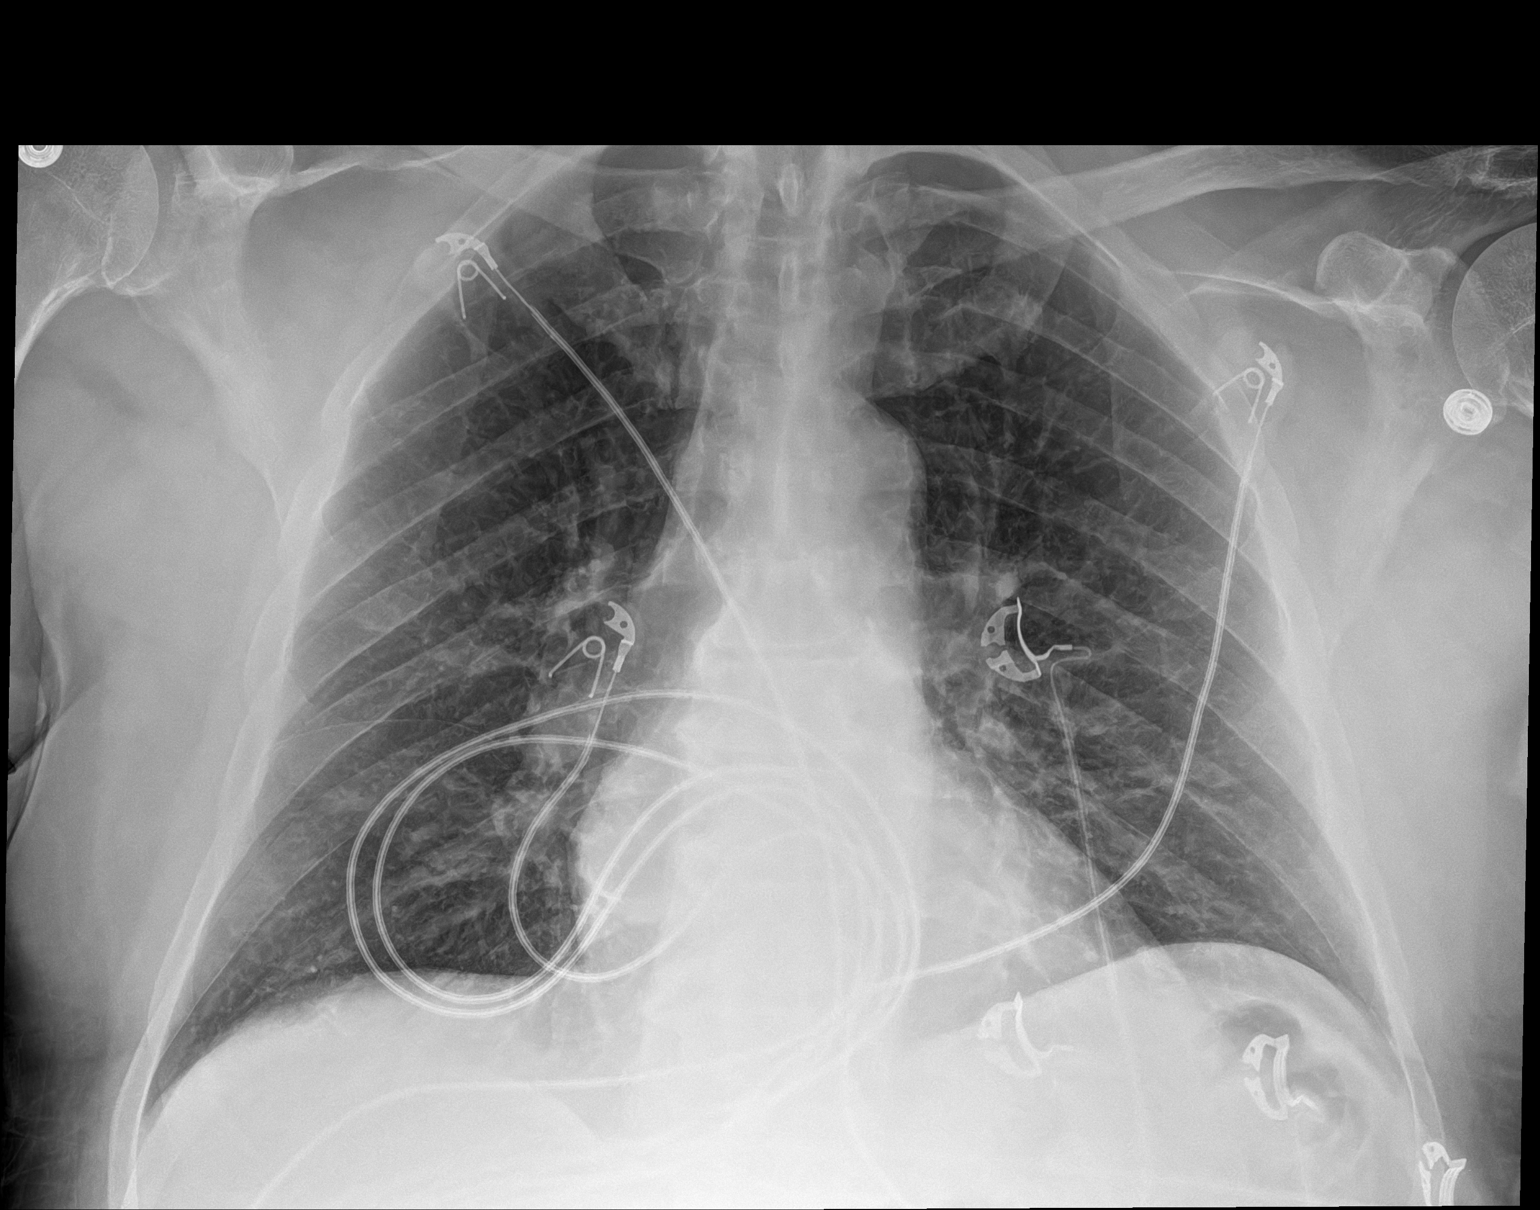

[1 of 1 positions shown; findings below may reference images not displayed]

FINDINGS: Mediastinum and hilar structures normal. Heart size normal. Low lung
volumes. Mild bilateral interstitial prominence. Mild pneumonitis
cannot be excluded. No pleural effusion or pneumothorax.
IMPRESSION: Low lung volumes. Mild bilateral interstitial prominence. Mild
pneumonitis cannot be excluded.

## 2021-03-23 ENCOUNTER — Other Ambulatory Visit: Payer: Self-pay

## 2021-03-23 ENCOUNTER — Ambulatory Visit (INDEPENDENT_AMBULATORY_CARE_PROVIDER_SITE_OTHER): Payer: No Typology Code available for payment source

## 2021-03-23 DIAGNOSIS — Z91038 Other insect allergy status: Secondary | ICD-10-CM

## 2021-05-04 ENCOUNTER — Ambulatory Visit: Payer: No Typology Code available for payment source

## 2021-05-11 ENCOUNTER — Other Ambulatory Visit: Payer: Self-pay

## 2021-05-11 ENCOUNTER — Ambulatory Visit (INDEPENDENT_AMBULATORY_CARE_PROVIDER_SITE_OTHER): Payer: No Typology Code available for payment source

## 2021-05-11 DIAGNOSIS — Z91038 Other insect allergy status: Secondary | ICD-10-CM | POA: Diagnosis not present

## 2021-06-08 ENCOUNTER — Other Ambulatory Visit: Payer: Self-pay

## 2021-06-08 ENCOUNTER — Ambulatory Visit (INDEPENDENT_AMBULATORY_CARE_PROVIDER_SITE_OTHER): Payer: No Typology Code available for payment source

## 2021-06-08 DIAGNOSIS — Z91038 Other insect allergy status: Secondary | ICD-10-CM

## 2021-06-20 ENCOUNTER — Other Ambulatory Visit (HOSPITAL_COMMUNITY): Payer: Self-pay | Admitting: Nurse Practitioner

## 2021-06-20 ENCOUNTER — Other Ambulatory Visit: Payer: Self-pay | Admitting: Nurse Practitioner

## 2021-06-20 DIAGNOSIS — F17200 Nicotine dependence, unspecified, uncomplicated: Secondary | ICD-10-CM

## 2021-06-20 DIAGNOSIS — Z008 Encounter for other general examination: Secondary | ICD-10-CM

## 2021-06-22 ENCOUNTER — Other Ambulatory Visit (HOSPITAL_COMMUNITY): Payer: Self-pay | Admitting: Nurse Practitioner

## 2021-06-22 DIAGNOSIS — K219 Gastro-esophageal reflux disease without esophagitis: Secondary | ICD-10-CM

## 2021-06-25 ENCOUNTER — Inpatient Hospital Stay (HOSPITAL_COMMUNITY): Admission: RE | Admit: 2021-06-25 | Payer: No Typology Code available for payment source | Source: Ambulatory Visit

## 2021-06-25 ENCOUNTER — Ambulatory Visit (HOSPITAL_COMMUNITY): Payer: No Typology Code available for payment source

## 2021-06-26 ENCOUNTER — Ambulatory Visit (HOSPITAL_COMMUNITY): Payer: No Typology Code available for payment source

## 2021-06-26 ENCOUNTER — Ambulatory Visit (HOSPITAL_COMMUNITY)
Admission: RE | Admit: 2021-06-26 | Discharge: 2021-06-26 | Disposition: A | Discharge status: Home / Self Care | Payer: No Typology Code available for payment source | Source: Ambulatory Visit | Attending: Nurse Practitioner | Admitting: Nurse Practitioner

## 2021-06-26 ENCOUNTER — Other Ambulatory Visit: Payer: Self-pay

## 2021-06-26 DIAGNOSIS — K219 Gastro-esophageal reflux disease without esophagitis: Secondary | ICD-10-CM | POA: Diagnosis not present

## 2021-06-26 DIAGNOSIS — F17200 Nicotine dependence, unspecified, uncomplicated: Secondary | ICD-10-CM | POA: Diagnosis not present

## 2021-07-06 ENCOUNTER — Ambulatory Visit (INDEPENDENT_AMBULATORY_CARE_PROVIDER_SITE_OTHER): Payer: No Typology Code available for payment source

## 2021-07-06 DIAGNOSIS — Z91038 Other insect allergy status: Secondary | ICD-10-CM

## 2021-08-03 ENCOUNTER — Ambulatory Visit: Payer: No Typology Code available for payment source

## 2021-08-09 ENCOUNTER — Ambulatory Visit
Admission: RE | Admit: 2021-08-09 | Discharge: 2021-08-09 | Disposition: A | Discharge status: Home / Self Care | Payer: No Typology Code available for payment source | Source: Ambulatory Visit | Attending: Nurse Practitioner | Admitting: Nurse Practitioner

## 2021-08-09 ENCOUNTER — Other Ambulatory Visit: Payer: Self-pay

## 2021-08-09 VITALS — BP 115/73 | HR 102 | Temp 98.2°F | Resp 18 | Ht 69.0 in | Wt 215.0 lb

## 2021-08-09 DIAGNOSIS — R300 Dysuria: Secondary | ICD-10-CM | POA: Insufficient documentation

## 2021-08-09 LAB — POCT URINALYSIS DIP (MANUAL ENTRY)
Bilirubin, UA: NEGATIVE
Blood, UA: NEGATIVE
Glucose, UA: NEGATIVE mg/dL
Ketones, POC UA: NEGATIVE mg/dL
Leukocytes, UA: NEGATIVE
Nitrite, UA: NEGATIVE
Protein Ur, POC: NEGATIVE mg/dL
Spec Grav, UA: 1.02 (ref 1.010–1.025)
Urobilinogen, UA: 0.2 E.U./dL
pH, UA: 5.5 (ref 5.0–8.0)

## 2021-08-09 NOTE — Discharge Instructions (Addendum)
Your urinalysis is negative for any symptoms of urinary tract infection today.  A urine culture has been ordered.  You will be contacted if the results are positive and provided treatment. ?Continue to increase fluids.  You should be drinking at least 6-8 8 ounce glasses of water daily. ?If your symptoms continue to persist after your urine culture results are received, you will need to follow-up with urology for further evaluation. ?

## 2021-08-09 NOTE — ED Provider Notes (Signed)
?[facility] ? ? ? ?CSN: [id number] ?Arrival date & time: 08/09/21  1047 ? ? ?  ? ?History   ?Chief Complaint ?Chief Complaint  ?Randy [name] presents with  ? Dysuria  ? ? ?HPI ?Randy Randy [name] is a 71 y.o. male.  ? ?Randy Randy [name] is a 71 year old male who presents for complaints of pain with urination.  Randy [name] states symptoms started approximately 6 days ago.  Randy [name] states 2 days prior he had to get a steroid injection for pain in his lower back.  He states 2 days later, he woke up and noticed some discomfort with urination.  He states that he has had intermittent discomfort since Randy onset of his symptoms.  He denies fever, chills, urinary hesitancy, decreased stream, hematuria, or penile discharge.  He states that he has also noticed some urinary frequency.  Randy [name] states that he has a history of kidney stones.  He states Randy has not had any low back pain.  Randy [name] also states that he saw urology in March of this year for Randy same or similar symptoms and was told that his evaluation was normal. ? ? ?Dysuria ?Presenting symptoms: dysuria   ?Associated symptoms: urinary frequency   ? ?Past Medical History:  ?Diagnosis Date  ? Angio-edema   ? Blepharospasm   ? BPH (benign prostatic hyperplasia)   ? Cervical myelopathy (HCC)   ? Glaucoma 2021  ? Hyperlipidemia   ? Nephrolithiasis   ? Optic neuropathy   ? Right  ? Urticaria   ? ? ?Randy [name] Active Problem List  ? Diagnosis Date Noted  ? 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 Perennial allergic rhinitis 10/01/2016  ? Chronic urticaria 10/01/2016  ? Stiffness of joints, not elsewhere classified, multiple sites 06/07/2013  ? Cervical pain (neck) 06/07/2013  ? Spasm of muscle 06/07/2013  ? ? ?Past Surgical History:  ?Procedure Laterality Date  ? NECK SURGERY  2015  ? TONSILLECTOMY    ? ? ? ? ? ?Home Medications   ? ?Prior to Admission medications   ?Medication Sig Start Date End Date Taking? Authorizing Provider  ?aspirin EC 81 MG tablet Take 81 mg by  mouth daily.   Yes [provider]  ?atorvastatin (LIPITOR) 80 MG tablet Take 40 mg by mouth daily.    Yes [provider]  ?Carboxymethylcell-Hypromellose (GENTEAL OP) Apply to eye as needed.   Yes [provider]  ?cetirizine (ZYRTEC) 10 MG tablet Take 2 tablets (20 mg total) by mouth 2 (two) times daily. 10/01/16  Yes Alfonse Spruce, MD  ?DULoxetine (CYMBALTA) 30 MG capsule Take 30 mg by mouth daily.   Yes [provider]  ?lisinopril (PRINIVIL,ZESTRIL) 40 MG tablet Take 20 mg by mouth daily.    Yes [provider]  ?naproxen sodium (ANAPROX) 275 MG tablet Take 275 mg by mouth 2 (two) times daily with a meal.   Yes [provider]  ?omeprazole (PRILOSEC) 20 MG capsule Take 20 mg by mouth daily.   Yes [provider]  ?PARoxetine (PAXIL) 10 MG tablet Take 10 mg by mouth daily.   Yes [provider]  ?terazosin (HYTRIN) 2 MG capsule Take 4 mg by mouth at bedtime.    Yes [provider]  ?EPINEPHrine 0.3 mg/0.3 mL IJ SOAJ injection Inject 0.3 mg into Randy muscle once.    [provider]  ?famotidine (PEPCID) 20 MG tablet Take 20 mg by mouth 2 (two) times daily.    [provider]  ?lidocaine (  XYLOCAINE) 5 % ointment Apply 1 application topically as needed.    [provider]  ?sildenafil (VIAGRA) 100 MG tablet Take 100 mg by mouth daily as needed for erectile dysfunction.    [provider]  ? ? ?Family History ?Family History  ?Problem Relation Age of Onset  ? Heart attack Father   ? Dementia Sister   ? Arthritis Sister   ? Breast cancer Sister   ? Healthy Son   ? Healthy Daughter   ? Allergic rhinitis Neg Hx   ? Angioedema Neg Hx   ? Asthma Neg Hx   ? Atopy Neg Hx   ? Eczema Neg Hx   ? Immunodeficiency Neg Hx   ? Urticaria Neg Hx   ? ? ?Social History ?Social History  ? ?Tobacco Use  ? Smoking status: Some Days  ? Smokeless tobacco: Never  ?Vaping Use  ? Vaping Use: Never used  ?Substance Use  Topics  ? Alcohol use: No  ? Drug use: No  ? ? ? ?Allergies   ?Hornet venom, Wasp venom, and Yellow jacket venom [bee venom] ? ? ?Review of Systems ?Review of Systems  ?Constitutional: Negative.   ?Respiratory: Negative.    ?Cardiovascular: Negative.   ?Gastrointestinal: Negative.   ?Genitourinary:  Positive for dysuria and frequency.  ?Skin: Negative.   ?Psychiatric/Behavioral: Negative.    ? ? ?Physical Exam ?Triage Vital Signs ?ED Triage Vitals  ?Enc Vitals Group  ?   BP 08/09/21 1105 115/73  ?   Pulse Rate 08/09/21 1105 (!) 102  ?   Resp 08/09/21 1105 18  ?   Temp 08/09/21 1105 98.2 ?F (36.8 ?C)  ?   Temp Source 08/09/21 1105 Oral  ?   SpO2 08/09/21 1105 93 %  ?   Weight 08/09/21 1107 215 lb (97.5 kg)  ?   Height 08/09/21 1107 5\' 9"  (1.753 m)  ?   Head Circumference --   ?   Peak Flow --   ?   Pain Score 08/09/21 1107 2  ?   Pain Loc --   ?   Pain Edu? --   ?   Excl. in GC? --   ? ?No data found. ? ?Updated Vital Signs ?BP 115/73 (BP Location: Right Arm)   Pulse (!) 102   Temp 98.2 ?F (36.8 ?C) (Oral)   Resp 18   Ht 5\' 9"  (1.753 m)   Wt 215 lb (97.5 kg)   SpO2 93%   BMI 31.75 kg/m?  ? ?Visual Acuity ?Right Eye Distance:   ?Left Eye Distance:   ?Bilateral Distance:   ? ?Right Eye Near:   ?Left Eye Near:    ?Bilateral Near:    ? ?Physical Exam ?Vitals and nursing note reviewed.  ?Constitutional:   ?   General: He is not in acute distress. ?   Appearance: Normal appearance.  ?HENT:  ?   Head: Normocephalic.  ?Cardiovascular:  ?   Rate and Rhythm: Normal rate and regular rhythm.  ?   Pulses: Normal pulses.  ?   Heart sounds: Normal heart sounds.  ?Pulmonary:  ?   Effort: Pulmonary effort is normal.  ?   Breath sounds: Normal breath sounds.  ?Abdominal:  ?   General: Bowel sounds are normal.  ?   Palpations: Abdomen is soft.  ?   Tenderness: There is no right CVA tenderness or left CVA tenderness.  ?Musculoskeletal:  ?   Cervical back: Normal range of motion.  ?Skin: ?  General: Skin is warm and dry.   ?Neurological:  ?   General: No focal deficit present.  ?   Mental Status: He is alert and oriented to person, place, and time.  ?Psychiatric:     ?   Mood and Affect: Mood normal.     ?   Behavior: Behavior normal.  ? ? ? ?UC Treatments / Results  ?Labs ?(all labs ordered are listed, but only abnormal results are displayed) ?Labs Reviewed  ?URINE CULTURE  ?POCT URINALYSIS DIP (MANUAL ENTRY)  ? ? ?EKG ? ? ?Radiology ?No results found. ? ?Procedures ?Procedures (including critical care time) ? ?Medications Ordered in UC ?Medications - No data to display ? ?Initial Impression / Assessment and Plan / UC Course  ?I have reviewed Randy triage vital signs and Randy nursing notes. ? ?Pertinent labs & imaging results that were available during my care of Randy Randy [name] were reviewed by me and considered in my medical decision making (see chart for details). ? ?Randy [name] is a 71 year old male who presents with dysuria for Randy past 5 to 6 days.  Randy [name] states symptoms started after he received a steroid injection 2 days prior.  On exam, Randy Randy [name]'s vital signs are stable, he does not have any abdominal pain, CVA tenderness, or flank pain.  His vital signs do not indicate any symptoms of pyelonephritis as he is afebrile.  Randy [name] has a history of kidney stones, but his urinalysis is negative and does not show any symptoms such as blood.  Reassured Randy Randy [name] that his urinalysis is normal.  We will order a culture for confirmatory testing.  Randy [name] has seen urology in Randy past and has a history of kidney stones.  Randy [name] was advised that if his culture is negative and he continues to have symptoms, he will need to follow-up with urology.  Randy [name] verbalizes understanding and is in agreement with this plan. ?Final Clinical Impressions(s) / UC Diagnoses  ? ?Final diagnoses:  ?Dysuria  ? ?Discharge Instructions   ?None ?  ? ?ED Prescriptions   ?None ?  ? ?PDMP not reviewed this encounter. ?  ?Abran Cantor, NP ?08/09/21  1140 ? ?

## 2021-08-09 NOTE — ED Triage Notes (Signed)
Pt reports injections in spine last week and reports 2 days later reports burning sensation with urinating and increased frequency. Hx of kidney stones. ?

## 2021-08-11 LAB — URINE CULTURE: Culture: NO GROWTH

## 2021-08-17 ENCOUNTER — Ambulatory Visit: Payer: No Typology Code available for payment source

## 2021-08-27 ENCOUNTER — Ambulatory Visit (INDEPENDENT_AMBULATORY_CARE_PROVIDER_SITE_OTHER): Payer: No Typology Code available for payment source

## 2021-08-27 DIAGNOSIS — Z91038 Other insect allergy status: Secondary | ICD-10-CM

## 2021-09-25 ENCOUNTER — Other Ambulatory Visit: Payer: Self-pay

## 2021-09-25 ENCOUNTER — Emergency Department (HOSPITAL_COMMUNITY)
Admission: EM | Admit: 2021-09-25 | Discharge: 2021-09-26 | Disposition: A | Discharge status: Home / Self Care | Payer: No Typology Code available for payment source | Attending: Emergency Medicine | Admitting: Emergency Medicine

## 2021-09-25 ENCOUNTER — Encounter (HOSPITAL_COMMUNITY): Payer: Self-pay

## 2021-09-25 DIAGNOSIS — W57XXXA Bitten or stung by nonvenomous insect and other nonvenomous arthropods, initial encounter: Secondary | ICD-10-CM | POA: Diagnosis not present

## 2021-09-25 DIAGNOSIS — S0096XA Insect bite (nonvenomous) of unspecified part of head, initial encounter: Secondary | ICD-10-CM | POA: Insufficient documentation

## 2021-09-25 DIAGNOSIS — T7840XA Allergy, unspecified, initial encounter: Secondary | ICD-10-CM

## 2021-09-25 DIAGNOSIS — F17210 Nicotine dependence, cigarettes, uncomplicated: Secondary | ICD-10-CM | POA: Insufficient documentation

## 2021-09-25 NOTE — ED Notes (Signed)
Pt ambulatory from lobby to tx room without difficulty or dizziness. Vital signs immediately taken after getting to room. Pt denies pain. Alert and oriented x4

## 2021-09-25 NOTE — ED Triage Notes (Addendum)
Pt states he was checking his B/P because he had an insect bite, and he noticed his B/P was low. Pt reports the lowest was 88/68. Pt denies any lightheadedness, CP, or ShOB.   Pt is allergic to stinging insects, but does not know what bite him. Pt denies any itching, swelling, or trouble breathing. Punctate bite noted above R eye.

## 2021-09-26 MED ORDER — DIPHENHYDRAMINE HCL 25 MG PO TABS: 25.0000 mg | ORAL_TABLET | Freq: Four times a day (QID) | ORAL | 0 refills | Status: DC

## 2021-09-26 NOTE — Discharge Instructions (Addendum)
You were evaluated in the Emergency Department and after careful evaluation, we did not find any emergent condition requiring admission or further testing in the hospital.  Your exam/testing today was overall reassuring.  Symptoms seem to be due to a mild allergic reaction from the insect bite.  Recommend the Benadryl as needed every 4-6 hours for itchiness.  Please return to the Emergency Department if you experience any worsening of your condition.  Thank you for allowing [place] to be a part of your care.

## 2021-09-26 NOTE — ED Provider Notes (Signed)
AP-EMERGENCY DEPT [facility] Emergency Department Provider Note MRN:  [id number]  Arrival date & time: 09/26/21     Chief Complaint   Hypotension and Insect Bite   History of Present Illness   Randy [name] is a 71 y.o. year-old male with no prior past medical history presenting to the ED with chief complaint of hypotension and insect bite.  Patient felt a mild stinging sensation to the right forehead, thinks that he was bit by an insect of some kind.  Later in the day, after taking his blood pressure medication, he noted his blood pressure to be a bit low, 90 systolic.  No other rash, no facial swelling, no other complaints.  Feels like his uvula is a bit swollen.  Review of Systems  A thorough review of systems was obtained and all systems are negative except as noted in the HPI and PMH.   Patient's Health History    Past Medical History:  Diagnosis Date   Angio-edema    Blepharospasm    BPH (benign prostatic hyperplasia)    Cervical myelopathy (HCC)    Glaucoma 2021   Hyperlipidemia    Nephrolithiasis    Optic neuropathy    Right   Urticaria     Past Surgical History:  Procedure Laterality Date   NECK SURGERY  2015   TONSILLECTOMY      Family History  Problem Relation Age of Onset   Heart attack Father    Dementia Sister    Arthritis Sister    Breast cancer Sister    Healthy Son    Healthy Daughter    Allergic rhinitis Neg Hx    Angioedema Neg Hx    Asthma Neg Hx    Atopy Neg Hx    Eczema Neg Hx    Immunodeficiency Neg Hx    Urticaria Neg Hx     Social History   Socioeconomic History   Marital status: Divorced    Spouse name: Not on file   Number of children: Not on file   Years of education: Not on file   Highest education level: Not on file  Occupational History   Not on file  Tobacco Use   Smoking status: Every Day    Types: Cigarettes   Smokeless tobacco: Never  Vaping Use   Vaping Use: Never used  Substance and Sexual Activity    Alcohol use: Yes   Drug use: No   Sexual activity: Not on file  Other Topics Concern   Not on file  Social History Narrative   Not on file   Social Determinants of Health   Financial Resource Strain: Not on file  Food Insecurity: Not on file  Transportation Needs: Not on file  Physical Activity: Not on file  Stress: Not on file  Social Connections: Not on file  Intimate Partner Violence: Not on file     Physical Exam   Vitals:   09/26/21 0145 09/26/21 0200  BP:  133/89  Pulse: 81 76  Resp:  18  Temp:    SpO2: 96% 97%    CONSTITUTIONAL: Well-appearing, NAD NEURO/PSYCH:  Alert and oriented x 3, no focal deficits EYES:  eyes equal and reactive ENT/NECK:  no LAD, no JVD CARDIO: Regular rate, well-perfused, normal S1 and S2 PULM:  CTAB no wheezing or rhonchi GI/GU:  non-distended, non-tender MSK/SPINE:  No gross deformities, no edema SKIN:  no rash, atraumatic   *Additional and/or pertinent findings included in MDM below  Diagnostic and Interventional Summary  EKG Interpretation  Date/Time:    Ventricular Rate:    PR Interval:    QRS Duration:   QT Interval:    QTC Calculation:   R Axis:     Text Interpretation:         Labs Reviewed - No data to display  No orders to display    Medications - No data to display   Procedures  /  Critical Care Procedures  ED Course and Medical Decision Making  Initial Impression and Ddx Insect bite, reportedly low blood pressure but normal here.  Very well-appearing, normal vital signs.  Sensation of uvula being swollen, not objectively swollen on exam, very well-appearing, provided reassurance.  Given patient's history of logic reaction to insect bites and stings, mild allergic reaction is possible.  Past medical/surgical history that increases complexity of ED encounter: None  Interpretation of Diagnostics Laboratory and/or imaging options to aid in the diagnosis/care of the patient were considered.  After careful  history and physical examination, it was determined that there was no indication for diagnostics at this time.  Patient Reassessment and Ultimate Disposition/Management     Discharge home.  Patient management required discussion with the following services or consulting groups:  None  Complexity of Problems Addressed Acute complicated illness or Injury  Additional Data Reviewed and Analyzed Further history obtained from: Further history from spouse/family member  Additional Factors Impacting ED Encounter Risk Prescriptions  Elmer Sow. Pilar Plate, MD [facility] Emergency Medicine [facility] [email or web address]  Final Clinical Impressions(s) / ED Diagnoses     ICD-10-CM   1. Insect bite of head, unspecified part, initial encounter  S00.96XA    W57.XXXA     2. Allergic reaction, initial encounter  T78.40XA       ED Discharge Orders          Ordered    diphenhydrAMINE (BENADRYL) 25 MG tablet  Every 6 hours        09/26/21 0220             Discharge Instructions Discussed with and Provided to Patient:    Discharge Instructions      You were evaluated in the Emergency Department and after careful evaluation, we did not find any emergent condition requiring admission or further testing in the hospital.  Your exam/testing today was overall reassuring.  Symptoms seem to be due to a mild allergic reaction from the insect bite.  Recommend the Benadryl as needed every 4-6 hours for itchiness.  Please return to the Emergency Department if you experience any worsening of your condition.  Thank you for allowing [place] to be a part of your care.       Sabas Sous, MD 09/26/21 [phone]

## 2021-10-12 ENCOUNTER — Ambulatory Visit: Payer: No Typology Code available for payment source

## 2021-10-19 ENCOUNTER — Ambulatory Visit (INDEPENDENT_AMBULATORY_CARE_PROVIDER_SITE_OTHER): Payer: No Typology Code available for payment source

## 2021-10-19 DIAGNOSIS — Z91038 Other insect allergy status: Secondary | ICD-10-CM | POA: Diagnosis not present

## 2021-10-19 DIAGNOSIS — J309 Allergic rhinitis, unspecified: Secondary | ICD-10-CM

## 2021-10-24 ENCOUNTER — Encounter: Payer: Self-pay | Admitting: Allergy & Immunology

## 2021-10-24 ENCOUNTER — Telehealth: Payer: Self-pay | Admitting: Allergy & Immunology

## 2021-10-24 ENCOUNTER — Ambulatory Visit (INDEPENDENT_AMBULATORY_CARE_PROVIDER_SITE_OTHER): Payer: No Typology Code available for payment source | Admitting: Allergy & Immunology

## 2021-10-24 VITALS — BP 128/78 | HR 102 | Temp 98.2°F | Resp 20 | Ht 70.0 in | Wt 215.0 lb

## 2021-10-24 DIAGNOSIS — Z91038 Other insect allergy status: Secondary | ICD-10-CM

## 2021-10-24 DIAGNOSIS — L508 Other urticaria: Secondary | ICD-10-CM

## 2021-10-24 DIAGNOSIS — J3089 Other allergic rhinitis: Secondary | ICD-10-CM

## 2021-10-24 NOTE — Telephone Encounter (Signed)
Faxed renewal of authorization request to [facility], [phone].   Patient came in for visit today, advised patient to reach out to [place] so that his authorization may be approved. Patient verbalized understanding.

## 2021-10-24 NOTE — Patient Instructions (Addendum)
1. Chronic urticaria - improved on antihistamines with sensizations to molds, dust mites, cat and dog - Continue with the cetirizine daily. - Continue with famotidine daily.     2. 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  - EpiPen is up to date.   3. Return in about 1 year (around 10/25/2022).    Please inform us of any Emergency Department visits, hospitalizations, or changes in symptoms. Call us before going to the ED for breathing or allergy symptoms since we might be able to fit you in for a sick visit. Feel free to contact us anytime with any questions, problems, or concerns.  It was a pleasure to see you today!  Websites that have reliable patient information: 1. American Academy of Asthma, Allergy, and Immunology: www.aaaai.org 2. Food Allergy Research and Education (FARE): foodallergy.org 3. Mothers of Asthmatics: http://www.asthmacommunitynetwork.org 4. American College of Allergy, Asthma, and Immunology: www.acaai.org   COVID-19 Vaccine Information can be found at: [email or web address] For questions related to vaccine distribution or appointments, please email [email or web address] or call [phone].   We realize that you might be concerned about having an allergic reaction to the COVID19 vaccines. To help with that concern, WE ARE OFFERING THE COVID19 VACCINES IN OUR OFFICE! Ask the front desk for dates!     "Like" [place] on Facebook and Instagram for our latest updates!      A healthy democracy works best when [employer] participate! Make sure you are registered to vote! If you have moved or changed any of your contact information, you will need to get this updated before voting!  In some cases, you MAY be able to register to vote online:  [email or web address]

## 2021-10-24 NOTE — Progress Notes (Signed)
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 Chronic urticaria - improved on antihistamines with sensizations to molds, dust mites, cat and dog - Continue with the cetirizine daily. - Continue with famotidine daily.     2. 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  - EpiPen is up to date.   3. Return in about 1 year (around 10/25/2022).   Subjective:   Randy [name] is a 71 y.o. 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  Randy [name] is a 71 y.o. male presenting for a follow up visit. He was last seen in July 2021 as an Office Visit. At that time, he was doing well on venom immunotherapy. He was tolerating his injections without a problem. We did refill his epinephrine autoinjector.   Since last visit, he has done very well.  As with previous visits, he tells a  lot of stories and changes topics quite frequently.  This makes for an interesting conversation.  He shows me a picture of his nephew who met Anastasio Champion.  Next to [facility], is 6 foot 4 nephew looks like a child.  He also goes on and on about a daughter who lives in [place] his husband works for [employer].  They like to buy Norva Pavlov Vuitton shoes.  Apparently they make sneakers.  Who knew!  Sadly, another daughter recently had a miscarriage over 4 July.  She lives in [place] and graduated from [employer].  She is a Pharmacist, hospital now and did some mission work in [place] with a and Systems analyst that [employer].  Regarding his hypersensitivities to stinging insects, his venom shots are going well. He is getting them every 6 weeks. He did have to go to the ED around 2-3 weeks ago. He had a bite or some kind of bug on the temple area. He ended up going to the ED because his BP dropped down to 88/68 and he was not feeling good. He did feel some swelling of his throat. When he got to the ED, he had to wait 9 hours to see a doctor. But he did not need to use his epinephrine and his vitals were stable once he was finally roomed. He is unsure of what bit him.   His heart rate has been elevated. This is  not necessarily correlated with his blood pressure. He is still smoking but recently got the patch. He has not applied it, however. He was going to do this a couple of months ago, but just has not gotten up the will to do it.   Hives are under good control.  He takes Zyrtec in the morning and Pepcid at night.  This combination seems to keep the hives at [place].  Otherwise, there have been no changes to his past medical history, surgical history, family history, or social history.    Review of Systems  Constitutional: Negative.  Negative for chills, fever, malaise/fatigue and weight loss.  HENT: Negative.  Negative for congestion, ear discharge, ear pain and sinus pain.   Eyes:  Negative for  pain, discharge and redness.  Respiratory:  Negative for cough, sputum production, shortness of breath and wheezing.   Cardiovascular: Negative.  Negative for chest pain and palpitations.  Gastrointestinal:  Negative for abdominal pain, constipation, diarrhea, heartburn, nausea and vomiting.  Skin: Negative.  Negative for itching and rash.  Neurological:  Negative for dizziness and headaches.  Endo/Heme/Allergies:  Negative for environmental allergies. Does not bruise/bleed easily.       Objective:   Blood pressure 128/78, pulse (!) 102, temperature 98.2 F (36.8 C), resp. rate 20, height _0  (1.778 m), weight 215 lb (97.5 kg), SpO2 96 %. Body mass index is 30.85 kg/m.    Physical Exam Vitals reviewed.  Constitutional:      Appearance: He is well-developed.     Comments: Talkative.   HENT:     Head: Normocephalic and atraumatic.     Right Ear: Tympanic membrane, ear canal and external ear normal.     Left Ear: Tympanic membrane, ear canal and external ear normal.     Nose: No nasal deformity, septal deviation, mucosal edema or rhinorrhea.     Right Turbinates: Enlarged, swollen and pale.     Left Turbinates: Enlarged, swollen and pale.     Right Sinus: No maxillary sinus tenderness or frontal sinus tenderness.     Left Sinus: No maxillary sinus tenderness or frontal sinus tenderness.     Mouth/Throat:     Mouth: Mucous membranes are not pale and not dry.     Pharynx: Uvula midline.  Eyes:     General: Lids are normal. No allergic shiner.       Right eye: No discharge.        Left eye: No discharge.     Conjunctiva/sclera: Conjunctivae normal.     Right eye: Right conjunctiva is not injected. No chemosis.    Left eye: Left conjunctiva is not injected. No chemosis.    Pupils: Pupils are equal, round, and reactive to light.  Cardiovascular:     Rate and Rhythm: Normal rate and regular rhythm.     Heart sounds: Normal heart sounds.  Pulmonary:     Effort: Pulmonary  effort is normal. No tachypnea, accessory muscle usage or respiratory distress.     Breath sounds: Normal breath sounds. No wheezing, rhonchi or rales.     Comments: Moving air well in all lung fields. No increased work of breathing noted.  Chest:     Chest wall: No tenderness.  Lymphadenopathy:     Cervical: No cervical adenopathy.  Skin:    Coloration: Skin is not pale.     Findings: No abrasion, erythema, petechiae or rash. Rash is not papular, urticarial or vesicular.  Neurological:     Mental Status:  He is alert.  Psychiatric:        Behavior: Behavior is cooperative.      Diagnostic studies: none       Salvatore Marvel, MD  [facility]

## 2021-10-25 ENCOUNTER — Telehealth: Payer: Self-pay | Admitting: Allergy & Immunology

## 2021-10-25 NOTE — Telephone Encounter (Signed)
Patient would like to come in an get his 3rd covid vaccine in the [place] office.   Please advise.

## 2021-10-29 ENCOUNTER — Encounter (INDEPENDENT_AMBULATORY_CARE_PROVIDER_SITE_OTHER): Payer: Self-pay | Admitting: *Deleted

## 2021-11-01 NOTE — Telephone Encounter (Signed)
Noted  

## 2021-11-07 ENCOUNTER — Other Ambulatory Visit (INDEPENDENT_AMBULATORY_CARE_PROVIDER_SITE_OTHER): Payer: Self-pay

## 2021-11-07 DIAGNOSIS — Z1211 Encounter for screening for malignant neoplasm of colon: Secondary | ICD-10-CM

## 2021-11-08 NOTE — Telephone Encounter (Signed)
Received update authorization - I have updated it in system.

## 2021-11-12 ENCOUNTER — Telehealth (INDEPENDENT_AMBULATORY_CARE_PROVIDER_SITE_OTHER): Payer: Self-pay

## 2021-11-12 ENCOUNTER — Encounter (INDEPENDENT_AMBULATORY_CARE_PROVIDER_SITE_OTHER): Payer: Self-pay

## 2021-11-12 NOTE — Telephone Encounter (Signed)
Referring MD/PCP: Lucky Cowboy   Procedure: Tcs  Reason/Indication:  Screening   Has patient had this procedure before?  Yes   If so, when, by whom and where? 10/22 Incomplete Prep     Is there a family history of colon cancer?  No   Who?  What age when diagnosed?    Is patient diabetic? If yes, Type 1 or Type 2   no      Does patient have prosthetic heart valve or mechanical valve?  no  Do you have a pacemaker/defibrillator?  no  Has patient ever had endocarditis/atrial fibrillation? no  Does patient use oxygen? no  Has patient had joint replacement within last 12 months?  no  Is patient constipated or do they take laxatives? No   Does patient have a history of alcohol/drug use? No   Have you had a stroke/heart attack last 6 mths? No   Do you take medicine for weight loss?  No   For male patients,: have you had a hysterectomy n/a                      are you post menopausal n/a                      do you still have your menstrual cycle n/a  Is patient on blood thinner such as Coumadin, Plavix and/or Aspirin? yes  Medications: Atorvastatin 80 1/2 tablet daily, Cetirizine HCL 10 mg daily, Cholecalciferol 50 mcg daily, duloxetine 30 mg daily, famotidine 30 mg two tab qhs, Lisinopril 40 mg 1/2 tab daily, naproxen 550 mg daily prn pain, omeprazole 20 mg daily, paroxetine 10 mg 1/2 tabl qhs, tadalafil 20 mg prn, terazpsin 2 mg two caps qhs, asa 81 mg daily  Allergies: NKDA  Medication Adjustment per Dr Levon Hedger none   Procedure date & time: 12/12/21 at 10:15

## 2021-11-12 NOTE — Telephone Encounter (Signed)
Cosmo Tetreault Ann Heman Que, CMA  ?

## 2021-11-30 ENCOUNTER — Ambulatory Visit (INDEPENDENT_AMBULATORY_CARE_PROVIDER_SITE_OTHER): Payer: No Typology Code available for payment source

## 2021-11-30 DIAGNOSIS — Z91038 Other insect allergy status: Secondary | ICD-10-CM

## 2021-12-11 ENCOUNTER — Telehealth (INDEPENDENT_AMBULATORY_CARE_PROVIDER_SITE_OTHER): Payer: Self-pay

## 2021-12-11 MED ORDER — PEG 3350-KCL-NA BICARB-NACL 420 G PO SOLR
4000.0000 mL | ORAL | 0 refills | Status: DC
Start: 2021-12-11 — End: 2022-01-25

## 2021-12-11 NOTE — Telephone Encounter (Signed)
Randy [name], CMA  ?

## 2022-01-06 DIAGNOSIS — I639 Cerebral infarction, unspecified: Secondary | ICD-10-CM

## 2022-01-06 HISTORY — DX: Cerebral infarction, unspecified: I63.9

## 2022-01-20 ENCOUNTER — Inpatient Hospital Stay (HOSPITAL_COMMUNITY)
Admission: EM | Admit: 2022-01-20 | Discharge: 2022-01-25 | DRG: 064 | Disposition: A | Discharge status: Rehabilitation Facility | Payer: No Typology Code available for payment source | Attending: Family Medicine | Admitting: Family Medicine

## 2022-01-20 ENCOUNTER — Emergency Department (HOSPITAL_COMMUNITY): Payer: No Typology Code available for payment source

## 2022-01-20 ENCOUNTER — Other Ambulatory Visit: Payer: Self-pay

## 2022-01-20 DIAGNOSIS — R4781 Slurred speech: Secondary | ICD-10-CM | POA: Diagnosis not present

## 2022-01-20 DIAGNOSIS — F1010 Alcohol abuse, uncomplicated: Secondary | ICD-10-CM

## 2022-01-20 DIAGNOSIS — I639 Cerebral infarction, unspecified: Principal | ICD-10-CM | POA: Diagnosis present

## 2022-01-20 DIAGNOSIS — E119 Type 2 diabetes mellitus without complications: Secondary | ICD-10-CM | POA: Diagnosis present

## 2022-01-20 DIAGNOSIS — I6389 Other cerebral infarction: Principal | ICD-10-CM | POA: Diagnosis present

## 2022-01-20 DIAGNOSIS — N400 Enlarged prostate without lower urinary tract symptoms: Secondary | ICD-10-CM | POA: Diagnosis present

## 2022-01-20 DIAGNOSIS — R471 Dysarthria and anarthria: Secondary | ICD-10-CM | POA: Diagnosis present

## 2022-01-20 DIAGNOSIS — R29705 NIHSS score 5: Secondary | ICD-10-CM | POA: Diagnosis present

## 2022-01-20 DIAGNOSIS — Z8249 Family history of ischemic heart disease and other diseases of the circulatory system: Secondary | ICD-10-CM

## 2022-01-20 DIAGNOSIS — Z7902 Long term (current) use of antithrombotics/antiplatelets: Secondary | ICD-10-CM

## 2022-01-20 DIAGNOSIS — I10 Essential (primary) hypertension: Secondary | ICD-10-CM

## 2022-01-20 DIAGNOSIS — G8194 Hemiplegia, unspecified affecting left nondominant side: Secondary | ICD-10-CM | POA: Diagnosis present

## 2022-01-20 DIAGNOSIS — I255 Ischemic cardiomyopathy: Secondary | ICD-10-CM | POA: Diagnosis present

## 2022-01-20 DIAGNOSIS — I110 Hypertensive heart disease with heart failure: Secondary | ICD-10-CM | POA: Diagnosis present

## 2022-01-20 DIAGNOSIS — E785 Hyperlipidemia, unspecified: Secondary | ICD-10-CM | POA: Diagnosis present

## 2022-01-20 DIAGNOSIS — R29810 Facial weakness: Secondary | ICD-10-CM | POA: Diagnosis not present

## 2022-01-20 DIAGNOSIS — Z79899 Other long term (current) drug therapy: Secondary | ICD-10-CM

## 2022-01-20 DIAGNOSIS — Z91038 Other insect allergy status: Secondary | ICD-10-CM

## 2022-01-20 DIAGNOSIS — R404 Transient alteration of awareness: Secondary | ICD-10-CM | POA: Diagnosis not present

## 2022-01-20 DIAGNOSIS — Z888 Allergy status to other drugs, medicaments and biological substances status: Secondary | ICD-10-CM

## 2022-01-20 DIAGNOSIS — F17210 Nicotine dependence, cigarettes, uncomplicated: Secondary | ICD-10-CM | POA: Diagnosis present

## 2022-01-20 DIAGNOSIS — R4701 Aphasia: Secondary | ICD-10-CM | POA: Diagnosis present

## 2022-01-20 DIAGNOSIS — H409 Unspecified glaucoma: Secondary | ICD-10-CM | POA: Diagnosis present

## 2022-01-20 DIAGNOSIS — Z743 Need for continuous supervision: Secondary | ICD-10-CM | POA: Diagnosis not present

## 2022-01-20 DIAGNOSIS — R6889 Other general symptoms and signs: Secondary | ICD-10-CM | POA: Diagnosis not present

## 2022-01-20 DIAGNOSIS — Z91030 Bee allergy status: Secondary | ICD-10-CM

## 2022-01-20 DIAGNOSIS — Z720 Tobacco use: Secondary | ICD-10-CM

## 2022-01-20 DIAGNOSIS — Z716 Tobacco abuse counseling: Secondary | ICD-10-CM

## 2022-01-20 DIAGNOSIS — Z7982 Long term (current) use of aspirin: Secondary | ICD-10-CM

## 2022-01-20 DIAGNOSIS — I5041 Acute combined systolic (congestive) and diastolic (congestive) heart failure: Secondary | ICD-10-CM | POA: Diagnosis present

## 2022-01-20 DIAGNOSIS — R270 Ataxia, unspecified: Secondary | ICD-10-CM | POA: Diagnosis present

## 2022-01-20 DIAGNOSIS — W19XXXA Unspecified fall, initial encounter: Secondary | ICD-10-CM | POA: Diagnosis present

## 2022-01-20 LAB — COMPREHENSIVE METABOLIC PANEL
ALT: 25 U/L (ref 0–44)
AST: 30 U/L (ref 15–41)
Albumin: 3.8 g/dL (ref 3.5–5.0)
Alkaline Phosphatase: 78 U/L (ref 38–126)
Anion gap: 6 (ref 5–15)
BUN: 11 mg/dL (ref 8–23)
CO2: 25 mmol/L (ref 22–32)
Calcium: 9.2 mg/dL (ref 8.9–10.3)
Chloride: 106 mmol/L (ref 98–111)
Creatinine, Ser: 0.85 mg/dL (ref 0.61–1.24)
GFR, Estimated: >60 mL/min (ref >60)
Glucose, Bld: 149 mg/dL — ABNORMAL HIGH (ref 70–99)
Potassium: 3.7 mmol/L (ref 3.5–5.1)
Sodium: 137 mmol/L (ref 135–145)
Total Bilirubin: 0.7 mg/dL (ref 0.3–1.2)
Total Protein: 7.1 g/dL (ref 6.5–8.1)

## 2022-01-20 LAB — DIFFERENTIAL
Abs Immature Granulocytes: 0.01 10*3/uL (ref 0.00–0.07)
Basophils Absolute: 0 10*3/uL (ref 0.0–0.1)
Basophils Relative: 0 %
Eosinophils Absolute: 0.1 10*3/uL (ref 0.0–0.5)
Eosinophils Relative: 3 %
Immature Granulocytes: 0 %
Lymphocytes Relative: 45 %
Lymphs Abs: 1.8 10*3/uL (ref 0.7–4.0)
Monocytes Absolute: 0.4 10*3/uL (ref 0.1–1.0)
Monocytes Relative: 9 %
Neutro Abs: 1.8 10*3/uL (ref 1.7–7.7)
Neutrophils Relative %: 43 %

## 2022-01-20 LAB — PROTIME-INR
INR: 1 (ref 0.8–1.2)
Prothrombin Time: 13.4 seconds (ref 11.4–15.2)

## 2022-01-20 LAB — CBG MONITORING, ED: Glucose-Capillary: 134 mg/dL — ABNORMAL HIGH (ref 70–99)

## 2022-01-20 LAB — CBC
HCT: 46.8 % (ref 39.0–52.0)
Hemoglobin: 16.1 g/dL (ref 13.0–17.0)
MCH: 32.1 pg (ref 26.0–34.0)
MCHC: 34.4 g/dL (ref 30.0–36.0)
MCV: 93.2 fL (ref 80.0–100.0)
Platelets: 174 10*3/uL (ref 150–400)
RBC: 5.02 MIL/uL (ref 4.22–5.81)
RDW: 14 % (ref 11.5–15.5)
WBC: 4.2 10*3/uL (ref 4.0–10.5)
nRBC: 0 % (ref 0.0–0.2)

## 2022-01-20 LAB — I-STAT CHEM 8, ED
BUN: 10 mg/dL (ref 8–23)
Calcium, Ion: 1.23 mmol/L (ref 1.15–1.40)
Chloride: 103 mmol/L (ref 98–111)
Creatinine, Ser: 0.9 mg/dL (ref 0.61–1.24)
Glucose, Bld: 145 mg/dL — ABNORMAL HIGH (ref 70–99)
HCT: 50 % (ref 39.0–52.0)
Hemoglobin: 17 g/dL (ref 13.0–17.0)
Potassium: 3.7 mmol/L (ref 3.5–5.1)
Sodium: 140 mmol/L (ref 135–145)
TCO2: 24 mmol/L (ref 22–32)

## 2022-01-20 LAB — ETHANOL: Alcohol, Ethyl (B): <10 mg/dL (ref <10)

## 2022-01-20 LAB — APTT: aPTT: 35 seconds (ref 24–36)

## 2022-01-20 MED ORDER — ASPIRIN 81 MG PO TBEC: 81.0000 mg | DELAYED_RELEASE_TABLET | Freq: Every day | ORAL | Status: DC

## 2022-01-20 MED ORDER — STROKE: EARLY STAGES OF RECOVERY BOOK
Freq: Once | Status: AC
  Filled 2022-01-20: qty 1

## 2022-01-20 MED ORDER — NICOTINE 21 MG/24HR TD PT24
21.0000 mg | MEDICATED_PATCH | Freq: Every day | TRANSDERMAL | Status: DC
  Administered 2022-01-21 – 2022-01-25 (×5): 21 mg via TRANSDERMAL
  Filled 2022-01-20 (×5): qty 1

## 2022-01-20 MED ORDER — ASPIRIN 81 MG PO CHEW
324.0000 mg | CHEWABLE_TABLET | Freq: Once | ORAL | Status: AC
  Administered 2022-01-20: 324 mg via ORAL
  Filled 2022-01-20: qty 4

## 2022-01-20 MED ORDER — FOLIC ACID 1 MG PO TABS
1.0000 mg | ORAL_TABLET | Freq: Every day | ORAL | Status: DC
  Administered 2022-01-21 – 2022-01-25 (×5): 1 mg via ORAL
  Filled 2022-01-20 (×5): qty 1

## 2022-01-20 MED ORDER — ACETAMINOPHEN 325 MG PO TABS: 650.0000 mg | ORAL_TABLET | ORAL | Status: DC | PRN

## 2022-01-20 MED ORDER — HEPARIN SODIUM (PORCINE) 5000 UNIT/ML IJ SOLN
5000.0000 [IU] | Freq: Three times a day (TID) | INTRAMUSCULAR | Status: DC
  Administered 2022-01-21 – 2022-01-25 (×13): 5000 [IU] via SUBCUTANEOUS
  Filled 2022-01-20 (×14): qty 1

## 2022-01-20 MED ORDER — CLOPIDOGREL BISULFATE 75 MG PO TABS
75.0000 mg | ORAL_TABLET | Freq: Every day | ORAL | Status: DC
  Administered 2022-01-21: 75 mg via ORAL
  Filled 2022-01-20: qty 1

## 2022-01-20 MED ORDER — ADULT MULTIVITAMIN W/MINERALS CH
1.0000 | ORAL_TABLET | Freq: Every day | ORAL | Status: DC
  Administered 2022-01-21 – 2022-01-25 (×5): 1 via ORAL
  Filled 2022-01-20 (×5): qty 1

## 2022-01-20 MED ORDER — PANTOPRAZOLE SODIUM 40 MG PO TBEC
40.0000 mg | DELAYED_RELEASE_TABLET | Freq: Every day | ORAL | Status: DC
  Administered 2022-01-21 – 2022-01-25 (×5): 40 mg via ORAL
  Filled 2022-01-20 (×5): qty 1

## 2022-01-20 MED ORDER — OXYCODONE HCL 5 MG PO TABS: 5.0000 mg | ORAL_TABLET | Freq: Four times a day (QID) | ORAL | Status: DC | PRN

## 2022-01-20 MED ORDER — ATORVASTATIN CALCIUM 40 MG PO TABS
40.0000 mg | ORAL_TABLET | Freq: Every day | ORAL | Status: DC
  Administered 2022-01-21 – 2022-01-25 (×5): 40 mg via ORAL
  Filled 2022-01-20 (×5): qty 1

## 2022-01-20 MED ORDER — FAMOTIDINE 20 MG PO TABS
20.0000 mg | ORAL_TABLET | Freq: Every day | ORAL | Status: DC
  Administered 2022-01-21 – 2022-01-24 (×5): 20 mg via ORAL
  Filled 2022-01-20 (×5): qty 1

## 2022-01-20 MED ORDER — LORAZEPAM 2 MG/ML IJ SOLN: 1.0000 mg | INTRAMUSCULAR | Status: AC | PRN

## 2022-01-20 MED ORDER — THIAMINE HCL 100 MG/ML IJ SOLN: 100.0000 mg | Freq: Every day | INTRAMUSCULAR | Status: DC

## 2022-01-20 MED ORDER — ACETAMINOPHEN 160 MG/5ML PO SOLN: 650.0000 mg | ORAL | Status: DC | PRN

## 2022-01-20 MED ORDER — THIAMINE MONONITRATE 100 MG PO TABS
100.0000 mg | ORAL_TABLET | Freq: Every day | ORAL | Status: DC
  Administered 2022-01-21 – 2022-01-25 (×5): 100 mg via ORAL
  Filled 2022-01-20 (×5): qty 1

## 2022-01-20 MED ORDER — ACETAMINOPHEN 650 MG RE SUPP: 650.0000 mg | RECTAL | Status: DC | PRN

## 2022-01-20 MED ORDER — LORAZEPAM 2 MG/ML IJ SOLN: 0.0000 mg | Freq: Four times a day (QID) | INTRAMUSCULAR | Status: AC

## 2022-01-20 MED ORDER — CLOPIDOGREL BISULFATE 75 MG PO TABS
300.0000 mg | ORAL_TABLET | Freq: Once | ORAL | Status: AC
  Administered 2022-01-20: 300 mg via ORAL
  Filled 2022-01-20: qty 4

## 2022-01-20 MED ORDER — LORAZEPAM 2 MG/ML IJ SOLN: 0.0000 mg | Freq: Two times a day (BID) | INTRAMUSCULAR | Status: AC

## 2022-01-20 MED ORDER — DULOXETINE HCL 30 MG PO CPEP
30.0000 mg | ORAL_CAPSULE | Freq: Every day | ORAL | Status: DC
  Administered 2022-01-21 – 2022-01-25 (×5): 30 mg via ORAL
  Filled 2022-01-20 (×5): qty 1

## 2022-01-20 MED ORDER — SENNOSIDES-DOCUSATE SODIUM 8.6-50 MG PO TABS: 1.0000 | ORAL_TABLET | Freq: Every evening | ORAL | Status: DC | PRN

## 2022-01-20 MED ORDER — PAROXETINE HCL 10 MG PO TABS
5.0000 mg | ORAL_TABLET | Freq: Every day | ORAL | Status: DC
  Administered 2022-01-21 – 2022-01-24 (×4): 5 mg via ORAL
  Filled 2022-01-20 (×9): qty 0.5

## 2022-01-20 MED ORDER — LORAZEPAM 1 MG PO TABS: 1.0000 mg | ORAL_TABLET | ORAL | Status: AC | PRN

## 2022-01-20 NOTE — Progress Notes (Signed)
2212:Code stroke cart activated. Pt had already been to CT, and site paged [facility]SP prior to cart activation. Dr. Gaye Pollack on camera at this time.

## 2022-01-20 NOTE — ED Provider Notes (Signed)
[facility] EMERGENCY DEPARTMENT Provider Note   CSN: [id number] Arrival date & time: 01/20/22  2153     History  Chief Complaint  Patient presents with   Stroke Symptoms    Randy [name] is a 71 y.o. male.  HPI   This patient is a 71 year old male who takes a baby aspirin, Lipitor for cholesterol, lisinopril for blood pressure.  He was drinking alcohol last night and thinks that around midnight he started to have difficulty with speech and at 3:00 he felt like he lost his balance falling backwards and striking his head.  The patient woke up this morning with weakness on the left side of his body including his hand and his leg as well as some facial droop and has been slurring his words or having difficulty speaking all day long.  He has never had a stroke to the best of his knowledge, he is followed by the [facility] and states that they have monitored his heart in the past but he has never had a heart attack.  He denies any chest pain or shortness of breath at this time.  He arrives by paramedic transport who noted his blood sugar to be just under 200 and is blood pressure to be slightly elevated at 419 systolic.  Home Medications Prior to Admission medications   Medication Sig Start Date End Date Taking? Authorizing Provider  aspirin EC 81 MG tablet Take 81 mg by mouth at bedtime.    [provider]  atorvastatin (LIPITOR) 80 MG tablet Take 40 mg by mouth daily.     [provider]  cetirizine (ZYRTEC) 10 MG tablet Take 2 tablets (20 mg total) by mouth 2 (two) times daily. Patient taking differently: Take 10 mg by mouth daily. 10/01/16   Valentina Shaggy, MD  Cholecalciferol (VITAMIN D) 50 MCG (2000 UT) tablet Take 2,000 Units by mouth daily.    [provider]  diphenhydrAMINE (BENADRYL) 25 MG tablet Take 1 tablet (25 mg total) by mouth every 6 (six) hours. Patient taking differently: Take 25 mg by mouth every 6 (six) hours as needed for allergies.  09/26/21   Maudie Flakes, MD  DULoxetine (CYMBALTA) 30 MG capsule Take 30 mg by mouth daily.    [provider]  EPINEPHrine 0.3 mg/0.3 mL IJ SOAJ injection Inject 0.3 mg into the muscle once.    [provider]  famotidine (PEPCID) 20 MG tablet Take 20 mg by mouth at bedtime.    [provider]  lisinopril (PRINIVIL,ZESTRIL) 40 MG tablet Take 20 mg by mouth daily.     [provider]  Multiple Vitamins-Minerals (CENTRUM) tablet Take 1 tablet by mouth daily.    [provider]  naproxen sodium (ANAPROX) 550 MG tablet Take 550 mg by mouth 2 (two) times daily as needed for moderate pain.    [provider]  omeprazole (PRILOSEC) 20 MG capsule Take 20 mg by mouth daily.    [provider]  PARoxetine (PAXIL) 10 MG tablet Take 5 mg by mouth at bedtime.    [provider]  polyethylene glycol-electrolytes (TRILYTE) 420 g solution Take 4,000 mLs by mouth as directed. 12/11/21   Harvel Quale, MD  tadalafil (CIALIS) 20 MG tablet Take 20 mg by mouth every 3 (three) days.    [provider]  terazosin (HYTRIN) 2 MG capsule Take 4 mg by mouth at bedtime.     [provider]      Allergies  Hornet venom, Wasp venom, Yellow jacket venom [bee venom], and Other    Review of Systems   Review of Systems  All other systems reviewed and are negative.   Physical Exam Updated Vital Signs BP 124/74   Pulse 61   Temp 98.7 F (37.1 C) (Oral)   Resp 15   Wt 97.5 kg   SpO2 95%   BMI 30.84 kg/m  Physical Exam Vitals and nursing note reviewed.  Constitutional:      General: He is not in acute distress.    Appearance: He is well-developed.  HENT:     Head: Normocephalic and atraumatic.     Mouth/Throat:     Pharynx: No oropharyngeal exudate.  Eyes:     General: No scleral icterus.       Right eye: No discharge.        Left eye: No discharge.     Conjunctiva/sclera: Conjunctivae normal.      Pupils: Pupils are equal, round, and reactive to light.  Neck:     Thyroid: No thyromegaly.     Vascular: No JVD.  Cardiovascular:     Rate and Rhythm: Normal rate and regular rhythm.     Heart sounds: Normal heart sounds. No murmur heard.    No friction rub. No gallop.  Pulmonary:     Effort: Pulmonary effort is normal. No respiratory distress.     Breath sounds: Normal breath sounds. No wheezing or rales.  Abdominal:     General: Bowel sounds are normal. There is no distension.     Palpations: Abdomen is soft. There is no mass.     Tenderness: There is no abdominal tenderness.  Musculoskeletal:        General: No tenderness. Normal range of motion.     Cervical back: Normal range of motion and neck supple.     Right lower leg: No edema.     Left lower leg: No edema.  Lymphadenopathy:     Cervical: No cervical adenopathy.  Skin:    General: Skin is warm and dry.     Findings: No erythema or rash.  Neurological:     Mental Status: He is alert.     Coordination: Coordination normal.     Comments: The patient has cranial nerves III through XII which are unremarkable except for left-sided facial droop.  He has normal sensation to his face bilaterally and his cranial nerves including his extraocular movements are normal, peripheral visual fields are normal, he does have slurred speech.  He has normal grips bilaterally but has a slight pronator drift on the left and has difficulty with finger-nose-finger on the left.  He cannot perform heel shin with his left leg either.  There is a slight weakness of the left lower extremity.  Sensation appears to be globally intact and there is no extinction.  The patient does have what appears to be some expressive aphasia with some difficulty getting words out, he is not saying the wrong words but is slurring and speaking slowly.  He has no agnosia, and no neglect.  Psychiatric:        Behavior: Behavior normal.     ED Results / Procedures /  Treatments   Labs (all labs ordered are listed, but only abnormal results are displayed) Labs Reviewed  COMPREHENSIVE METABOLIC PANEL - Abnormal; Notable for the following components:      Result Value   Glucose, Bld 149 (*)    All other components within normal limits  I-STAT CHEM 8, ED - Abnormal; Notable for the following components:   Glucose, Bld 145 (*)    All other components within normal limits  CBG MONITORING, ED - Abnormal; Notable for the following components:   Glucose-Capillary 134 (*)    All other components within normal limits  ETHANOL  PROTIME-INR  APTT  CBC  DIFFERENTIAL  RAPID URINE DRUG SCREEN, HOSP PERFORMED  URINALYSIS, ROUTINE W REFLEX MICROSCOPIC    EKG EKG Interpretation  Date/Time:  Sunday January 20 2022 21:59:47 EDT Ventricular Rate:  69 PR Interval:  155 QRS Duration: 80 QT Interval:  381 QTC Calculation: 409 R Axis:   52 Text Interpretation: Sinus rhythm Confirmed by Eber Hong [phone]) on 01/20/2022 10:13:35 PM  Radiology CT HEAD CODE STROKE WO CONTRAST  Result Date: 01/20/2022 CLINICAL DATA:  Code stroke.  Slurred speech EXAM: CT HEAD WITHOUT CONTRAST TECHNIQUE: Contiguous axial images were obtained from the base of the skull through the vertex without intravenous contrast. RADIATION DOSE REDUCTION: This exam was performed according to the departmental dose-optimization program which includes automated exposure control, adjustment of the mA and/or kV according to patient size and/or use of iterative reconstruction technique. COMPARISON:  None Available. FINDINGS: Brain: There is no mass, hemorrhage or extra-axial collection. The size and configuration of the ventricles and extra-axial CSF spaces are normal. There is hypoattenuation of the periventricular white matter, most commonly indicating chronic ischemic microangiopathy. Vascular: No abnormal hyperdensity of the major intracranial arteries or dural venous sinuses. No intracranial  atherosclerosis. Skull: The visualized skull base, calvarium and extracranial soft tissues are normal. Sinuses/Orbits: No fluid levels or advanced mucosal thickening of the visualized paranasal sinuses. No mastoid or middle ear effusion. The orbits are normal. ASPECTS [facility] Stroke Program Early CT Score) - Ganglionic level infarction (caudate, lentiform nuclei, internal capsule, insula, M1-M3 cortex): 7 - Supraganglionic infarction (M4-M6 cortex): 3 Total score (0-10 with 10 being normal): 10 IMPRESSION: 1. No acute intracranial abnormality. 2. ASPECTS is 10 These results were called by telephone at the time of interpretation on 01/20/2022 at 10:20 pm to provider [facility] , who verbally acknowledged these results. Electronically Signed   By: Deatra Robinson M.D.   On: 01/20/2022 22:20    Procedures Procedures    Medications Ordered in ED Medications  clopidogrel (PLAVIX) tablet 300 mg (has no administration in time range)  aspirin chewable tablet 324 mg (has no administration in time range)    ED Course/ Medical Decision Making/ A&P                           Medical Decision Making Amount and/or Complexity of Data Reviewed Labs: ordered. Radiology: ordered.  Risk OTC drugs. Prescription drug management. Decision regarding hospitalization.   This patient presents to the ED for concern of code stroke with his last seen well at approximately midnight last night, this involves an extensive number of treatment options, and is a complaint that carries with it a high risk of complications and morbidity.  The differential diagnosis includes stroke, hypoglycemia, hypertensive urgency, intracranial hemorrhage or injury or trauma, alcohol abuse or infection   Co morbidities that complicate the patient evaluation  Alcohol use, hypertension   Additional history obtained:  Additional history obtained from electronic medical record External records from outside source obtained and reviewed  including prior colonoscopy from about 10 days ago, it does not appear that this actually happened.  He has had office visits recently   Lab  Tests:  I Ordered, and personally interpreted labs.  The pertinent results include:  unremarkable BMP / CBC   Imaging Studies ordered:  I ordered imaging studies including CT head  I independently visualized and interpreted imaging which showed no acute hermorrhage I agree with the radiologist interpretation   Cardiac Monitoring: / EKG:  The patient was maintained on a cardiac monitor.  I personally viewed and interpreted the cardiac monitored which showed an underlying rhythm of: normal sinus rhythm   Consultations Obtained:  I requested consultation with the neurologist,  and discussed lab and imaging findings as well as pertinent plan - they recommend: admission / plavix / asa - keep local Not TPA candidate - sx started 6 PM yesterday - > 24 hours after spouse arrived and clarified story.   Problem List / ED Course / Critical interventions / Medication management  Due to the patient's unilateral weakness with associated facial droop and difficulty speaking I activated a code stroke with last known well at midnight last night.  It has been approximately 22 hours and though the patient does not qualify for TNK he may be an LVO candidate.  His symptoms are fairly dense with obvious facial droop left-sided weakness and dysmetria, CT scan pending. I ordered medication including plavix and asa  for stroke  Reevaluation of the patient after these medicines showed that the patient stayed the same I have reviewed the patients home medicines and have made adjustments as needed   Social Determinants of Health:  Heavy ETOH use   Test / Admission - Considered:  Admit to hospital.         Final Clinical Impression(s) / ED Diagnoses Final diagnoses:  Acute ischemic stroke [facility])    Rx / DC Orders ED Discharge Orders     None          Eber Hong, MD 01/20/22 2302

## 2022-01-20 NOTE — Consult Note (Signed)
[facility] TeleNeurology Consult Services   Patient Name:   Randy [name], Randy [name] Date of Birth:   02-02-1951 Identification Number:   MRN - [id number] Date of Service:   01/20/2022 22:09:23  Diagnosis:       I63.9 - Cerebrovascular accident (CVA), unspecified mechanism ([place])  Impression:      71 y/o man with h/o HTN presents to the ED s/p fall and with left sided weakness and slurred speech. Last known well > 24 hours. Admit for stroke evaluation.  Our recommendations are outlined below.  Recommendations:        Stroke/Telemetry Floor       Neuro Checks       Bedside Swallow Eval       DVT Prophylaxis       IV Fluids, Normal Saline       Head of Bed 30 Degrees       Euglycemia and Avoid Hyperthermia (PRN Acetaminophen)       Bolus with Clopidogrel 300 mg bolus x1 and initiate dual antiplatelet therapy with Aspirin 81 mg daily and Clopidogrel 75 mg daily       Antihypertensives PRN if Blood pressure is greater than 220/120 or there is a concern for End organ damage/contraindications for permissive HTN. If blood pressure is greater than 220/120 give labetalol PO or IV or Vasotec IV with a goal of 15% reduction in BP during the first 24 hours.  Per facility request will defer further work up, management, and referrals to inpatient service, inclusive of inpatient neurology consult.  Sign Out:       Discussed with Emergency Department Provider    ------------------------------------------------------------------------------  Advanced Imaging: Advanced Imaging Deferred because:  Last known well > 24 hours   Metrics: Last Known Well: 01/19/2022 18:00:00 [facility] Notification Time: 01/20/2022 22:08:34 Arrival Time: 01/20/2022 21:53:00 Stamp Time: 01/20/2022 22:09:23 Initial Response Time: 01/20/2022 22:11:55 Symptoms: slurred speech. Initial patient interaction: 01/20/2022 22:17:12 NIHSS Assessment Completed: 01/20/2022 22:20:17 Patient is not a  candidate for Thrombolytic. Thrombolytic Medical Decision: 01/20/2022 22:20:19 Patient was not deemed candidate for Thrombolytic because of following reasons: Last Well Known Above 4.5 Hours.  I personally Reviewed the CT Head and it Showed No Acute Hemorrhage or Acute Core Infarct  Primary Provider Notified of Diagnostic Impression and Management Plan on: 01/20/2022 22:25:17    ------------------------------------------------------------------------------  History of Present Illness: Patient is a 71 year old Male.  Patient was brought by EMS for symptoms of slurred speech. 71 y/o man with h/o HTN presents to the ED s/p fall and with left sided weakness and slurred speech. Emergent telestroke consult requested. Last known normal at 1800 yesterday, when he started having gait difficulty. He fell at 0300 this morning. CT brain reviewed and case discussed with Dr. Sabra Heck (ED attending) at bedside. Wife at bedside. All questions answered.   Past Medical History:      Hypertension  Medications:  No Anticoagulant use  Antiplatelet use: Yes ASA Reviewed EMR for current medications  Allergies:  Reviewed  Social History: Smoking: Yes Alcohol Use: Yes Drug Use: No  Family History:  There is no family history of premature cerebrovascular disease pertinent to this consultation  ROS : 14 Points Review of Systems was performed and was negative except mentioned in HPI.  Past Surgical History: There Is No Surgical History Contributory To Today's Visit    Examination: BP(139/102), Pulse(63), Blood Glucose(134) 1A: Level of Consciousness - Alert; keenly responsive + 0 1B: Ask Month and Age - Both Questions Right +  0 1C: Blink Eyes & Squeeze Hands - Performs Both Tasks + 0 2: Test Horizontal Extraocular Movements - Normal + 0 3: Test Visual Fields - No Visual Loss + 0 4: Test Facial Palsy (Use Grimace if Obtunded) - Minor paralysis (flat nasolabial fold, smile asymmetry) + 1 5A:  Test Left Arm Motor Drift - Drift, but doesn't hit bed + 1 5B: Test Right Arm Motor Drift - No Drift for 10 Seconds + 0 6A: Test Left Leg Motor Drift - Drift, but doesn't hit bed + 1 6B: Test Right Leg Motor Drift - No Drift for 5 Seconds + 0 7: Test Limb Ataxia (FNF/Heel-Shin) - Ataxia in 1 Limb + 1 8: Test Sensation - Normal; No sensory loss + 0 9: Test Language/Aphasia - Normal; No aphasia + 0 10: Test Dysarthria - Mild-Moderate Dysarthria: Slurring but can be understood + 1 11: Test Extinction/Inattention - No abnormality + 0  NIHSS Score: 5   Pre-Morbid Modified Rankin Scale: 0 Points = No symptoms at all  Spoke with : Dr. Sabra Heck  Patient/Family was informed the Neurology Consult would occur via TeleHealth consult by way of interactive audio and video telecommunications and consented to receiving care in this manner.   Patient is being evaluated for possible acute neurologic impairment and high probability of imminent or life-threatening deterioration. I spent total of 20 minutes providing care to this patient, including time for face to face visit via telemedicine, review of medical records, imaging studies and discussion of findings with providers, the patient and/or family.   Dr Meryl Crutch   [facility] For Inpatient follow-up with [facility] physician please call [facility] [phone]. This is not an outpatient service. Post hospital discharge, please contact hospital directly.

## 2022-01-20 NOTE — Progress Notes (Signed)
2211 call time 2213 beeper time 2214 exam started 2215 exam finished 2215 images sent to [place] exam completed in epic 2218 [facility] radiology called

## 2022-01-20 NOTE — ED Notes (Signed)
Pts significant other pulled this RN aside and informed this RN the Pt drinks beer and a tall Btl of wine every day. Significant other reports Pt has gone through bad ETOH withdrawals in the pass.

## 2022-01-20 NOTE — ED Triage Notes (Signed)
Pt arrived from home via [facility] w c/o stroke sx. Pt states at midnight 01/20/2022 he noticed slurred speech. At 0300 01/20/2022 he fell hitting the back of his head and noticed left side weakness. Pt has notable left side weakness and left sided facial droop.

## 2022-01-20 NOTE — ED Notes (Signed)
Neuro Cart and Neuro Consult underway at Pt bedside upon Pt return to treatment room following CT Scan

## 2022-01-20 NOTE — ED Notes (Signed)
Hospitalist at bedside 

## 2022-01-21 ENCOUNTER — Observation Stay (HOSPITAL_COMMUNITY): Payer: No Typology Code available for payment source

## 2022-01-21 ENCOUNTER — Other Ambulatory Visit: Payer: Self-pay

## 2022-01-21 ENCOUNTER — Encounter (HOSPITAL_COMMUNITY): Payer: Self-pay | Admitting: Family Medicine

## 2022-01-21 ENCOUNTER — Encounter (HOSPITAL_COMMUNITY)
Admission: RE | Admit: 2022-01-21 | Discharge: 2022-01-21 | Disposition: A | Discharge status: Home / Self Care | Payer: No Typology Code available for payment source | Source: Ambulatory Visit | Attending: Gastroenterology | Admitting: Gastroenterology

## 2022-01-21 ENCOUNTER — Other Ambulatory Visit (HOSPITAL_COMMUNITY): Payer: Self-pay | Admitting: *Deleted

## 2022-01-21 DIAGNOSIS — H409 Unspecified glaucoma: Secondary | ICD-10-CM | POA: Diagnosis present

## 2022-01-21 DIAGNOSIS — Z7982 Long term (current) use of aspirin: Secondary | ICD-10-CM | POA: Diagnosis not present

## 2022-01-21 DIAGNOSIS — I6389 Other cerebral infarction: Secondary | ICD-10-CM | POA: Diagnosis present

## 2022-01-21 DIAGNOSIS — E785 Hyperlipidemia, unspecified: Secondary | ICD-10-CM | POA: Diagnosis present

## 2022-01-21 DIAGNOSIS — I639 Cerebral infarction, unspecified: Secondary | ICD-10-CM | POA: Diagnosis not present

## 2022-01-21 DIAGNOSIS — I10 Essential (primary) hypertension: Secondary | ICD-10-CM | POA: Diagnosis not present

## 2022-01-21 DIAGNOSIS — R29810 Facial weakness: Secondary | ICD-10-CM | POA: Diagnosis present

## 2022-01-21 DIAGNOSIS — Z8249 Family history of ischemic heart disease and other diseases of the circulatory system: Secondary | ICD-10-CM | POA: Diagnosis not present

## 2022-01-21 DIAGNOSIS — I159 Secondary hypertension, unspecified: Secondary | ICD-10-CM | POA: Diagnosis not present

## 2022-01-21 DIAGNOSIS — F1010 Alcohol abuse, uncomplicated: Secondary | ICD-10-CM | POA: Diagnosis present

## 2022-01-21 DIAGNOSIS — R270 Ataxia, unspecified: Secondary | ICD-10-CM | POA: Diagnosis present

## 2022-01-21 DIAGNOSIS — R4701 Aphasia: Secondary | ICD-10-CM | POA: Diagnosis present

## 2022-01-21 DIAGNOSIS — Z91038 Other insect allergy status: Secondary | ICD-10-CM | POA: Diagnosis not present

## 2022-01-21 DIAGNOSIS — I6350 Cerebral infarction due to unspecified occlusion or stenosis of unspecified cerebral artery: Secondary | ICD-10-CM | POA: Diagnosis not present

## 2022-01-21 DIAGNOSIS — Z888 Allergy status to other drugs, medicaments and biological substances status: Secondary | ICD-10-CM | POA: Diagnosis not present

## 2022-01-21 DIAGNOSIS — Z720 Tobacco use: Secondary | ICD-10-CM | POA: Diagnosis not present

## 2022-01-21 DIAGNOSIS — R471 Dysarthria and anarthria: Secondary | ICD-10-CM | POA: Diagnosis present

## 2022-01-21 DIAGNOSIS — N400 Enlarged prostate without lower urinary tract symptoms: Secondary | ICD-10-CM | POA: Diagnosis present

## 2022-01-21 DIAGNOSIS — I255 Ischemic cardiomyopathy: Secondary | ICD-10-CM | POA: Diagnosis present

## 2022-01-21 DIAGNOSIS — Z79899 Other long term (current) drug therapy: Secondary | ICD-10-CM | POA: Diagnosis not present

## 2022-01-21 DIAGNOSIS — F17210 Nicotine dependence, cigarettes, uncomplicated: Secondary | ICD-10-CM | POA: Diagnosis present

## 2022-01-21 DIAGNOSIS — W19XXXA Unspecified fall, initial encounter: Secondary | ICD-10-CM | POA: Diagnosis present

## 2022-01-21 DIAGNOSIS — Z91030 Bee allergy status: Secondary | ICD-10-CM | POA: Diagnosis not present

## 2022-01-21 DIAGNOSIS — G8194 Hemiplegia, unspecified affecting left nondominant side: Secondary | ICD-10-CM | POA: Diagnosis present

## 2022-01-21 DIAGNOSIS — I110 Hypertensive heart disease with heart failure: Secondary | ICD-10-CM | POA: Diagnosis present

## 2022-01-21 DIAGNOSIS — Z716 Tobacco abuse counseling: Secondary | ICD-10-CM | POA: Diagnosis not present

## 2022-01-21 DIAGNOSIS — I69354 Hemiplegia and hemiparesis following cerebral infarction affecting left non-dominant side: Secondary | ICD-10-CM | POA: Diagnosis not present

## 2022-01-21 DIAGNOSIS — R29705 NIHSS score 5: Secondary | ICD-10-CM | POA: Diagnosis present

## 2022-01-21 DIAGNOSIS — E119 Type 2 diabetes mellitus without complications: Secondary | ICD-10-CM | POA: Diagnosis present

## 2022-01-21 DIAGNOSIS — Z7902 Long term (current) use of antithrombotics/antiplatelets: Secondary | ICD-10-CM | POA: Diagnosis not present

## 2022-01-21 DIAGNOSIS — I5041 Acute combined systolic (congestive) and diastolic (congestive) heart failure: Secondary | ICD-10-CM | POA: Diagnosis not present

## 2022-01-21 LAB — LIPID PANEL
Cholesterol: 149 mg/dL (ref 0–200)
HDL: 33 mg/dL — ABNORMAL LOW (ref >40)
LDL Cholesterol: 90 mg/dL (ref 0–99)
Total CHOL/HDL Ratio: 4.5 RATIO
Triglycerides: 131 mg/dL (ref <150)
VLDL: 26 mg/dL (ref 0–40)

## 2022-01-21 LAB — URINALYSIS, ROUTINE W REFLEX MICROSCOPIC
Bacteria, UA: NONE SEEN
Bilirubin Urine: NEGATIVE
Glucose, UA: NEGATIVE mg/dL
Ketones, ur: NEGATIVE mg/dL
Leukocytes,Ua: NEGATIVE
Nitrite: NEGATIVE
Protein, ur: NEGATIVE mg/dL
Specific Gravity, Urine: 1.009 (ref 1.005–1.030)
pH: 5 (ref 5.0–8.0)

## 2022-01-21 LAB — COMPREHENSIVE METABOLIC PANEL
ALT: 24 U/L (ref 0–44)
AST: 25 U/L (ref 15–41)
Albumin: 3.5 g/dL (ref 3.5–5.0)
Alkaline Phosphatase: 77 U/L (ref 38–126)
Anion gap: 6 (ref 5–15)
BUN: 9 mg/dL (ref 8–23)
CO2: 28 mmol/L (ref 22–32)
Calcium: 9.2 mg/dL (ref 8.9–10.3)
Chloride: 106 mmol/L (ref 98–111)
Creatinine, Ser: 0.88 mg/dL (ref 0.61–1.24)
GFR, Estimated: >60 mL/min (ref >60)
Glucose, Bld: 105 mg/dL — ABNORMAL HIGH (ref 70–99)
Potassium: 3.9 mmol/L (ref 3.5–5.1)
Sodium: 140 mmol/L (ref 135–145)
Total Bilirubin: 0.5 mg/dL (ref 0.3–1.2)
Total Protein: 6.5 g/dL (ref 6.5–8.1)

## 2022-01-21 LAB — RAPID URINE DRUG SCREEN, HOSP PERFORMED
Amphetamines: NOT DETECTED
Barbiturates: NOT DETECTED
Benzodiazepines: NOT DETECTED
Cocaine: NOT DETECTED
Opiates: NOT DETECTED
Tetrahydrocannabinol: NOT DETECTED

## 2022-01-21 LAB — ECHOCARDIOGRAM COMPLETE
Area-P 1/2: 3.42 cm2
S' Lateral: 2.7 cm
Weight: 3439.18 oz

## 2022-01-21 LAB — VITAMIN B12: Vitamin B-12: 301 pg/mL (ref 180–914)

## 2022-01-21 LAB — HEMOGLOBIN A1C
Hgb A1c MFr Bld: 5.8 % — ABNORMAL HIGH (ref 4.8–5.6)
Mean Plasma Glucose: 119.76 mg/dL

## 2022-01-21 MED ORDER — ASPIRIN 81 MG PO TBEC
81.0000 mg | DELAYED_RELEASE_TABLET | Freq: Every day | ORAL | Status: DC
  Administered 2022-01-21 – 2022-01-24 (×4): 81 mg via ORAL
  Filled 2022-01-21 (×4): qty 1

## 2022-01-21 MED ORDER — CLOPIDOGREL BISULFATE 75 MG PO TABS: 75.0000 mg | ORAL_TABLET | Freq: Every day | ORAL | Status: DC

## 2022-01-21 MED ORDER — CLOPIDOGREL BISULFATE 75 MG PO TABS
75.0000 mg | ORAL_TABLET | Freq: Every day | ORAL | Status: DC
  Administered 2022-01-22 – 2022-01-25 (×4): 75 mg via ORAL
  Filled 2022-01-21 (×4): qty 1

## 2022-01-21 NOTE — Assessment & Plan Note (Addendum)
-   Hold lisinopril for permissive hypertension

## 2022-01-21 NOTE — ED Notes (Signed)
Gave pt breakfast tray

## 2022-01-21 NOTE — Pre-Procedure Instructions (Signed)
Attempted pre-op phone call. Left VM for him to call us back. 

## 2022-01-21 NOTE — ED Notes (Signed)
Report given to [place], RN on 300

## 2022-01-21 NOTE — Assessment & Plan Note (Signed)
-  Left-sided ataxia, slow slurred speech, and some numbness/left facial droop at time of admission. 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 - Patient loaded with aspirin and Plavix - Plan is to continue aspirin and Plavix daily for 3 weeks; and then daily aspirin on daily basis. -Appreciate neurology service recommendations -Continue statin 40 mg daily -Continue risk factor modifications (stop smoking, stop alcohol consumption and control blood pressure). - PT/OT recommending CIR; will follow on candidacy for safe discharge plans.

## 2022-01-21 NOTE — Evaluation (Signed)
Physical Therapy Evaluation Patient Details Name: Randy [name] MRN: [id number] DOB: 1950-05-04 Today's Date: 01/21/2022  History of Present Illness  Randy [name] is a 71 y.o. male with medical history significant of BPH, hyperlipidemia, hypertension, alcohol abuse, smoking use disorder, and more presents the ED with a chief complaint of weakness.  Patient reports that between 6 PM and 8 PM last night he started having left-sided weakness and slurred speech.  Patient reports he was drinking at the time.  He drank about a liter of wine.  He reports he had no headache.  He felt off balance, and ultimately at 3 AM he fell backwards striking the back of his head.  He was diaphoretic at that time.  He reports he was so weak he could not get up and his wife had to help him.  He did not lose consciousness.  Patient reports that he fell because he felt off balance.  He just put mouthwash in his mouth to gargle, and when throwing his head back his whole body went back.  Patient reports he has had no change in vision.  No change in hearing.  At first he says no numbness or paresthesias, but then he says his left shoulder has some paresthesias.  He reports his speech is slurred and slow.  His speech is still not returned to baseline per his report.  He denies any chest pain or palpitations.  Patient reports that he knew his gait was off, but did not feel like it was an asymmetric weakness issue.  He just felt weak everywhere per his report.  Patient reports he is right-hand dominant.  He had no difficulty swallowing.  He has no other complaints at this time.  Patient reports that up until this point he was in his normal state of health.   Clinical Impression  Patient able to transfer to chair w/o AD demonstrating unsteadiness on feet, switched to RW for room/hallway ambulation with good carryover and increased safety/stability. Patient tolerated sitting up in chair after therapy. Patient will benefit from  continued skilled physical therapy in hospital and recommended venue below to increase strength, balance, endurance for safe ADLs and gait.     Recommendations for follow up therapy are one component of a multi-disciplinary discharge planning process, led by the attending physician.  Recommendations may be updated based on patient status, additional functional criteria and insurance authorization.  Follow Up Recommendations Acute inpatient rehab (3hours/day)      Assistance Recommended at Discharge Set up Supervision/Assistance  Patient can return home with the following  A little help with walking and/or transfers;A little help with bathing/dressing/bathroom;Help with stairs or ramp for entrance;Assistance with cooking/housework    Equipment Recommendations None recommended by PT  Recommendations for Other Services       Functional Status Assessment Patient has had a recent decline in their functional status and demonstrates the ability to make significant improvements in function in a reasonable and predictable amount of time.     Precautions / Restrictions Precautions Precautions: Fall Restrictions Weight Bearing Restrictions: No      Mobility  Bed Mobility Overal bed mobility: Modified Independent             General bed mobility comments: increased time    Transfers Overall transfer level: Modified independent                 General transfer comment: patient transfered to chair w/o AD, slightly unsteady on feet    Ambulation/Gait Ambulation/Gait assistance:  Min guard Gait Distance (Feet): 50 Feet Assistive device: Rolling walker (2 wheels) Gait Pattern/deviations: Decreased step length - right, Decreased step length - left, Decreased stride length Gait velocity: decreased     General Gait Details: Patient unsteady on feet when transferring to chair w/o AD, switched to RW for room/hallway ambulation with good carryover and increased  stability  Stairs            Wheelchair Mobility    Modified Rankin (Stroke Patients Only)       Balance Overall balance assessment: Needs assistance Sitting-balance support: Bilateral upper extremity supported, Feet supported Sitting balance-Leahy Scale: Good Sitting balance - Comments: seated EOB   Standing balance support: Bilateral upper extremity supported, During functional activity Standing balance-Leahy Scale: Fair Standing balance comment: fair w/o AD, fair/good with RW                             Pertinent Vitals/Pain Pain Assessment Pain Assessment: No/denies pain    Home Living Family/patient expects to be discharged to:: Private residence Living Arrangements: Spouse/significant other Available Help at Discharge: Family;Available PRN/intermittently Type of Home: Apartment Home Access: Stairs to enter Entrance Stairs-Rails: Right;Left;Can reach both Entrance Stairs-Number of Steps: 12   Home Layout: One level Home Equipment: Cane - single point;BSC/3in1      Prior Function Prior Level of Function : Independent/Modified Independent             Mobility Comments: patient is a Hydrographic surveyor, drives, 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 ADLs Comments: Independent     Hand Dominance   Dominant Hand: Right    Extremity/Trunk Assessment   Upper Extremity Assessment Upper Extremity Assessment: Defer to OT evaluation    Lower Extremity Assessment Lower Extremity Assessment: LLE deficits/detail LLE Deficits / Details: patient slightly weaker on L LE w/ hip flexion (MMT 3+/5) LLE Sensation: WNL LLE Coordination: WNL    Cervical / Trunk Assessment Cervical / Trunk Assessment: Normal  Communication   Communication: No difficulties  Cognition Arousal/Alertness: Awake/alert Behavior During Therapy: WFL for tasks assessed/performed Overall Cognitive Status: Within Functional Limits for tasks assessed                                           General Comments      Exercises     Assessment/Plan    PT Assessment Patient needs continued PT services  PT Problem List Decreased strength;Decreased activity tolerance;Decreased balance;Decreased mobility       PT Treatment Interventions DME instruction;Balance training;Gait training;Stair training;Functional mobility training;Patient/family education;Therapeutic activities;Therapeutic exercise    PT Goals (Current goals can be found in the Care Plan section)  Acute Rehab PT Goals Patient Stated Goal: return home after rehab PT Goal Formulation: With patient Time For Goal Achievement: 01/28/22 Potential to Achieve Goals: Good    Frequency Min 3X/week     Co-evaluation               AM-PAC PT "6 Clicks" Mobility  Outcome Measure Help needed turning from your back to your side while in a flat bed without using bedrails?: None Help needed moving from lying on your back to sitting on the side of a flat bed without using bedrails?: None Help needed moving to and from a bed to a chair (including a wheelchair)?: A Little Help needed standing up from a chair  using your arms (e.g., wheelchair or bedside chair)?: A Little Help needed to walk in hospital room?: A Little Help needed climbing 3-5 steps with a railing? : A Lot 6 Click Score: 19    End of Session   Activity Tolerance: Patient tolerated treatment well;Patient limited by fatigue Patient left: in chair;with call bell/phone within reach Nurse Communication: Mobility status PT Visit Diagnosis: Unsteadiness on feet (R26.81);Other abnormalities of gait and mobility (R26.89);Muscle weakness (generalized) (M62.81)    Time: 8295-6213 PT Time Calculation (min) (ACUTE ONLY): 31 min   Charges:   PT Evaluation $PT Eval Moderate Complexity: 1 Mod PT Treatments $Therapeutic Activity: 23-37 mins        Zigmund Gottron, SPT

## 2022-01-21 NOTE — Progress Notes (Addendum)
Inpatient Rehabilitation Admissions Coordinator   I have left patient a voicemail on his cell and attempted to contact him by phone on his [place] room phone. I will await further discussions with him to assist in planning his rehab needs.  Danne Baxter, RN, MSN Rehab Admissions Coordinator [phone] 01/21/2022 2:33 PM  I spoke with wife by phone at his bedside. I discussed a possible CIR admit with a estimated LOS of 5 to 7 days based on his PT eval today. She states they were told last night that he would be at [facility] for 2 weeks. She requests that I follow up with her in a few days before they decide on rehab preference. He has had therapy at a [place] center in the past. He is connected with [place], [place]. OT eval pending. Please call me with any questions.  Danne Baxter, RN, MSN Rehab Admissions Coordinator [phone] 01/21/2022 2:48 PM

## 2022-01-21 NOTE — Progress Notes (Signed)
*  PRELIMINARY RESULTS* Echocardiogram 2D Echocardiogram has been performed.  Randy [name] 01/21/2022, 9:16 AM

## 2022-01-21 NOTE — Progress Notes (Signed)
OT Cancellation Note  Patient Details Name: Randy [name] MRN: [id number] DOB: October 06, 1950   Cancelled Treatment:    Reason Eval/Treat Not Completed: Patient at procedure or test/ unavailable. Pt was undergoing carotid doppler, then, pt received an echo. OT will follow up as schedule allows.   [name] 01/21/2022, 9:54 AM

## 2022-01-21 NOTE — H&P (Signed)
History and Physical    Patient: Randy [name] DOB: [birth year] DOA: 01/20/2022 DOS: the patient was seen and examined on 01/21/2022 PCP: Allayne Butcher, NP  Patient coming from: Home  Chief Complaint:  Chief Complaint  Patient presents with   Stroke Symptoms   HPI: Randy [name] is a 71 y.o. male with medical history significant of BPH, hyperlipidemia, hypertension, alcohol abuse, smoking use disorder, and more presents the ED with a chief complaint of weakness.  Patient reports that between 6 PM and 8 PM last night he started having left-sided weakness and slurred speech.  Patient reports he was drinking at the time.  He drank about a liter of wine.  He reports he had no headache.  He felt off balance, and ultimately at 3 AM he fell backwards striking the back of his head.  He was diaphoretic at that time.  He reports he was so weak he could not get up and his wife had to help him.  He did not lose consciousness.  Patient reports that he fell because he felt off balance.  He just put mouthwash in his mouth to gargle, and when throwing his head back his whole body went back.  Patient reports he has had no change in vision.  No change in hearing.  At first he says no numbness or paresthesias, but then he says his left shoulder has some paresthesias.  He reports his speech is slurred and slow.  His speech is still not returned to baseline per his report.  He denies any chest pain or palpitations.  Patient reports that he knew his gait was off, but did not feel like it was an asymmetric weakness issue.  He just felt weak everywhere per his report.  Patient reports he is right-hand dominant.  He had no difficulty swallowing.  He has no other complaints at this time.  Patient reports that up until this point he was in his normal state of health.  Patient does smoke 1 pack of cigarettes per day.  He is thought about quitting and has nicotine patches at home but has not started the quitting  process yet.  Advised on the importance of cessation.  Patient drinks alcohol.  He is not able to quantify how much he drinks, but that he does drink most nights of the week.  Last night he drank a liter of wine, sometimes he drinks 1/5 of liquor.  Patient reports he uses no illicit drugs.  He is vaccinated for COVID.  Patient is full code. Review of Systems: As mentioned in the history of present illness. All other systems reviewed and are negative. Past Medical History:  Diagnosis Date   Angio-edema    Blepharospasm    BPH (benign prostatic hyperplasia)    Cervical myelopathy ([place])    Glaucoma 2021   Hyperlipidemia    Nephrolithiasis    Optic neuropathy    Right   Urticaria    Past Surgical History:  Procedure Laterality Date   NECK SURGERY  2015   TONSILLECTOMY     Social History:  reports that he has been smoking cigarettes. He has never used smokeless tobacco. He reports current alcohol use. He reports that he does not use drugs.  Allergies  Allergen Reactions   Hornet Venom Anaphylaxis   Wasp Venom Anaphylaxis   Yellow Jacket Venom [Bee Venom] Anaphylaxis   Other     Dogs and Cats per allergy clinic     Family History  Problem Relation  Age of Onset   Heart attack Father    Dementia Sister    Arthritis Sister    Breast cancer Sister    Healthy Son    Healthy Daughter    Allergic rhinitis Neg Hx    Angioedema Neg Hx    Asthma Neg Hx    Atopy Neg Hx    Eczema Neg Hx    Immunodeficiency Neg Hx    Urticaria Neg Hx     Prior to Admission medications   Medication Sig Start Date End Date Taking? Authorizing Provider  aspirin EC 81 MG tablet Take 81 mg by mouth at bedtime.    [provider]  atorvastatin (LIPITOR) 80 MG tablet Take 40 mg by mouth daily.     [provider]  cetirizine (ZYRTEC) 10 MG tablet Take 2 tablets (20 mg total) by mouth 2 (two) times daily. Patient taking differently: Take 10 mg by mouth daily. 10/01/16   Valentina Shaggy, MD  Cholecalciferol (VITAMIN D) 50 MCG (2000 UT) tablet Take 2,000 Units by mouth daily.    [provider]  diphenhydrAMINE (BENADRYL) 25 MG tablet Take 1 tablet (25 mg total) by mouth every 6 (six) hours. Patient taking differently: Take 25 mg by mouth every 6 (six) hours as needed for allergies. 09/26/21   Maudie Flakes, MD  DULoxetine (CYMBALTA) 30 MG capsule Take 30 mg by mouth daily.    [provider]  EPINEPHrine 0.3 mg/0.3 mL IJ SOAJ injection Inject 0.3 mg into the muscle once.    [provider]  famotidine (PEPCID) 20 MG tablet Take 20 mg by mouth at bedtime.    [provider]  lisinopril (PRINIVIL,ZESTRIL) 40 MG tablet Take 20 mg by mouth daily.     [provider]  Multiple Vitamins-Minerals (CENTRUM) tablet Take 1 tablet by mouth daily.    [provider]  naproxen sodium (ANAPROX) 550 MG tablet Take 550 mg by mouth 2 (two) times daily as needed for moderate pain.    [provider]  omeprazole (PRILOSEC) 20 MG capsule Take 20 mg by mouth daily.    [provider]  PARoxetine (PAXIL) 10 MG tablet Take 5 mg by mouth at bedtime.    [provider]  polyethylene glycol-electrolytes (TRILYTE) 420 g solution Take 4,000 mLs by mouth as directed. 12/11/21   Harvel Quale, MD  tadalafil (CIALIS) 20 MG tablet Take 20 mg by mouth every 3 (three) days.    [provider]  terazosin (HYTRIN) 2 MG capsule Take 4 mg by mouth at bedtime.     [provider]    Physical Exam: Vitals:   01/20/22 2250 01/20/22 2304 01/20/22 2330 01/21/22 0000  BP:  (!) 140/77 (!) 163/108 (!) 147/93  Pulse:  64 60 62  Resp:  15 19 17   Temp:      TempSrc:      SpO2:  94% 96% 98%  Weight: 97.5 kg      1.  General: Patient lying supine in bed,  no acute distress   2. Psychiatric: Alert and oriented x 3, mood and behavior normal for situation, pleasant and cooperative with exam   3.  Neurologic: Speech is slow and slurred, left-sided facial droop, left-sided finger-to-nose ataxia, left-sided heel-to-shin ataxia, 5 out of 5 strength in the upper and lower extremities bilaterally, although right extremities are stronger, sensation intact bilateral upper and lower extremities,   4. HEENMT:  Head is atraumatic, normocephalic, pupils  reactive to light, neck is supple, trachea is midline, mucous membranes are moist   5. Respiratory : Lungs are clear to auscultation bilaterally without wheezing, rhonchi, rales, no cyanosis, no increase in work of breathing or accessory muscle use   6. Cardiovascular : Heart rate normal, rhythm is regular, no murmurs, rubs or gallops, no peripheral edema, peripheral pulses palpated   7. Gastrointestinal:  Abdomen is soft, nondistended, nontender to palpation bowel sounds active, no masses or organomegaly palpated   8. Skin:  Skin is warm, dry and intact without rashes, acute lesions, or ulcers on limited exam   9.Musculoskeletal:  No acute deformities or trauma, no asymmetry in tone, no peripheral edema, peripheral pulses palpated, no tenderness to palpation in the extremities  Data Reviewed: In the ED Temp 98.7, heart rate 61-65, respiratory rate 13-15, blood pressure 124/74-139/102, satting at 95% No leukocytosis with white blood cell count of 4.2, hemoglobin 16.1 Chemistry was unremarkable Alcohol levels undetectable CT stroke shows no acute intracranial abnormality.  Telemetry neuro was consulted and determined patient not to be a TNK candidate, advised loading with Plavix and aspirin and admitting for stroke work-up Admission requested for stroke work-up  Assessment and Plan: * CVA (cerebral vascular accident) ([place]) - Left-sided ataxia, slow slurred speech, Facial droop - CT head showed no acute intracranial abnormality - Echo in the a.m. - MRI brain in the a.m. - Patient loaded with aspirin and Plavix - Continue aspirin and  Plavix daily - Consult neuro - Ultrasound carotids - Continue monitor  ETOH abuse - Drinks most nights - Does not really quantify how much he drinks but last night he had a liter of wine, and sometimes he drinks 1/5 of liquor - CIWA protocol - Counseled on importance of reducing alcohol intake  Tobacco use - Nicotine patch - Counseled on importance of cessation  HTN (hypertension) - Hold lisinopril for permissive hypertension      Advance Care Planning:   Code Status: Full Code   Consults: Neuro  Family Communication: Wife at bedside  Severity of Illness: The appropriate patient status for this patient is OBSERVATION. Observation status is judged to be reasonable and necessary in order to provide the required intensity of service to ensure the patient's safety. The patient's presenting symptoms, physical exam findings, and initial radiographic and laboratory data in the context of their medical condition is felt to place them at decreased risk for further clinical deterioration. Furthermore, it is anticipated that the patient will be medically stable for discharge from the hospital within 2 midnights of admission.   Author: Rolla Plate, DO 01/21/2022 12:09 AM  For on call review [email or web address].

## 2022-01-21 NOTE — Progress Notes (Signed)
? ?  Inpatient Rehab Admissions Coordinator : ? ?Per therapy recommendations, patient was screened for CIR candidacy by Jaaziel Peatross RN MSN.  At this time patient appears to be a potential candidate for CIR. I will place a rehab consult per protocol for full assessment. Please call me with any questions. ? ?Deoni Cosey RN MSN ?Admissions Coordinator ?[phone] ?  ?

## 2022-01-21 NOTE — ED Notes (Signed)
Pt stated that he wanted to eat his breakfast before pt goes up to 300.

## 2022-01-21 NOTE — Consult Note (Signed)
I connected with  Randy [name] on 01/21/22 by a video enabled telemedicine application and verified that I am speaking with the correct person using two identifiers.   I discussed the limitations of evaluation and management by telemedicine. The patient expressed understanding and agreed to proceed.   Neurology Consultation Reason for Consult: stroke Referring Physician: Dr Victorino Dike  CC: Slurred speech, left-sided weakness  History is obtained from: Patient, chart review  HPI: Randy [name] is a 71 y.o. male with history of hyperlipidemia who presented with sudden onset of slurred speech and left-sided weakness.  Patient reports he was drinking alcohol and around midnight noticed slurred speech.  At around 3 AM he noticed that his balance was off and he was falling backwards.  This morning when he woke up he noticed the left side of his body including hand and leg were weak and therefore he came to the hospital.  Reports taking aspirin 81 mg daily.  Denies prior stroke.  Last known normal: Around midnight 01/21/2022 Event happened at home No acute events overnight No thrombectomy as no large vessel occlusion mRS 0  ROS: All other systems reviewed and negative except as noted in the HPI.   Past Medical History:  Diagnosis Date   Angio-edema    Blepharospasm    BPH (benign prostatic hyperplasia)    Cervical myelopathy (HCC)    Glaucoma 2021   Hyperlipidemia    Nephrolithiasis    Optic neuropathy    Right   Urticaria      Family History  Problem Relation Age of Onset   Heart attack Father    Dementia Sister    Arthritis Sister    Breast cancer Sister    Healthy Son    Healthy Daughter    Allergic rhinitis Neg Hx    Angioedema Neg Hx    Asthma Neg Hx    Atopy Neg Hx    Eczema Neg Hx    Immunodeficiency Neg Hx    Urticaria Neg Hx     Social History:  reports that he has been smoking cigarettes. He has never used smokeless tobacco. He reports current  alcohol use. He reports that he does not use drugs.   Medications Prior to Admission  Medication Sig Dispense Refill Last Dose   aspirin EC 81 MG tablet Take 81 mg by mouth at bedtime.   01/20/2022   atorvastatin (LIPITOR) 80 MG tablet Take 40 mg by mouth daily.    01/20/2022   cetirizine (ZYRTEC) 10 MG tablet Take 2 tablets (20 mg total) by mouth 2 (two) times daily. (Patient taking differently: Take 10 mg by mouth daily.) 120 tablet 5 01/20/2022   Cholecalciferol (VITAMIN D) 50 MCG (2000 UT) tablet Take 2,000 Units by mouth daily.   01/20/2022   diphenhydrAMINE (BENADRYL) 25 MG tablet Take 1 tablet (25 mg total) by mouth every 6 (six) hours. (Patient taking differently: Take 25 mg by mouth every 6 (six) hours as needed for allergies.) 20 tablet 0 prn   DULoxetine (CYMBALTA) 30 MG capsule Take 30 mg by mouth daily.   01/20/2022   EPINEPHrine 0.3 mg/0.3 mL IJ SOAJ injection Inject 0.3 mg into the muscle as needed for anaphylaxis.   prn   famotidine (PEPCID) 20 MG tablet Take 20 mg by mouth at bedtime.   01/20/2022   latanoprost (XALATAN) 0.005 % ophthalmic solution Place 1 drop into both eyes at bedtime.   01/20/2022   lisinopril (PRINIVIL,ZESTRIL) 40 MG tablet Take 20 mg  by mouth daily.    01/20/2022   metoprolol succinate (TOPROL-XL) 25 MG 24 hr tablet Take 12.5 mg by mouth daily.   01/20/2022 at am   Multiple Vitamins-Minerals (CENTRUM) tablet Take 1 tablet by mouth daily.   01/20/2022   naproxen sodium (ANAPROX) 550 MG tablet Take 550 mg by mouth 2 (two) times daily as needed for moderate pain.   prn   omeprazole (PRILOSEC) 20 MG capsule Take 20 mg by mouth daily.   01/20/2022   PARoxetine (PAXIL) 10 MG tablet Take 5 mg by mouth at bedtime.   01/20/2022   terazosin (HYTRIN) 2 MG capsule Take 4 mg by mouth at bedtime.    01/20/2022   polyethylene glycol-electrolytes (TRILYTE) 420 g solution Take 4,000 mLs by mouth as directed. (Patient not taking: Reported on 01/21/2022) 4000 mL 0 Not Taking       Exam: Current vital signs: BP 106/69 (BP Location: Right Arm)   Pulse 67   Temp 98.4 F (36.9 C)   Resp 18   Ht 5\' 10"  (1.778 m)   Wt 94.4 kg Comment: standing scale  SpO2 98%   BMI 29.87 kg/m  Vital signs in last 24 hours: Temp:  [97.7 F (36.5 C)-98.7 F (37.1 C)] 98.4 F (36.9 C) (10/16 1349) Pulse Rate:  [57-76] 67 (10/16 1349) Resp:  [13-20] 18 (10/16 1349) BP: (82-163)/(50-108) 106/69 (10/16 1349) SpO2:  [94 %-100 %] 98 % (10/16 1349) Weight:  [94.4 kg-97.5 kg] 94.4 kg (10/16 1124)   Physical Exam  Constitutional: Appears well-developed and well-nourished.  Psych: Affect appropriate to situation Eyes: No scleral injection Neuro: AOx3, [place], mild dysarthria, cranial nerves II to XII are grossly intact except left facial droop, antigravity strength in left upper extremity but drifts without touching bed in 10 seconds, antigravity strength in all other extremities without drift, FTN intact bilaterally, normal sensation to touch per patient  NIHSS 4  INPUTS: 1A: Level of consciousness --> 0 = Alert; keenly responsive 1B: Ask month and age --> 0 = Both questions right 1C: 'Blink eyes' & 'squeeze hands' --> 0 = Performs both tasks 2: Horizontal extraocular movements --> 0 = Normal 3: Visual fields --> 0 = No visual loss 4: Facial palsy --> 2 = Partial paralysis (lower face) 5A: Left arm motor drift --> 0 = No drift for 10 seconds 5B: Right arm motor drift --> 0 = No drift for 10 seconds 6A: Left leg motor drift --> 0 = No drift for 5 seconds 6B: Right leg motor drift --> 1 = Drift, but doesn't hit bed 7: Limb Ataxia --> 0 = No ataxia 8: Sensation --> 0 = Normal; no sensory loss 9: Language/aphasia --> 0 = Normal; no aphasia 10: Dysarthria --> 1 = Mild-moderate dysarthria: slurring but can be understood 11: Extinction/inattention --> 0 = No abnormality    I have reviewed labs in epic and the results pertinent to this consultation are: CBC:  Recent Labs   Lab 01/20/22 2200 01/20/22 2209  WBC 4.2  --   NEUTROABS 1.8  --   HGB 16.1 17.0  HCT 46.8 50.0  MCV 93.2  --   PLT 174  --     Basic Metabolic Panel:  Lab Results  Component Value Date   NA 140 01/21/2022   K 3.9 01/21/2022   CO2 28 01/21/2022   GLUCOSE 105 (H) 01/21/2022   BUN 9 01/21/2022   CREATININE 0.88 01/21/2022   CALCIUM 9.2 01/21/2022   GFRNONAA >60 01/21/2022  GFRAA >89 10/07/2016   Lipid Panel:  Lab Results  Component Value Date   LDLCALC 90 01/21/2022   HgbA1c:  Lab Results  Component Value Date   HGBA1C 5.8 (H) 01/21/2022   Urine Drug Screen: No results found for: "LABOPIA", "COCAINSCRNUR", "LABBENZ", "AMPHETMU", "THCU", "LABBARB"  Alcohol Level     Component Value Date/Time   ETH <10 01/20/2022 2200     I have reviewed the images obtained: Without contrast 01/20/2022: No acute intracranial abnormality.  ASPECTS is 10  MRI brain without contrast 01/21/2022: Acute nonhemorrhagic right paramedian pontine infarct measures up to 9 mm. Moderate generalized atrophy. Degenerative changes in the upper cervical spine with at least mild central canal narrowing at C3-4.  MRA head without contrast 0/16/2023: Moderate to high-grade stenoses in the proximal M3 segments of the right superior division. Moderate stenosis in the proximal posteroinferior right M2 segment. More distal segmental narrowing in the left MCA branches. No significant proximal stenosis, aneurysm, or branch vessel occlusion in the circle[place].   Carotid ultrasound bilaterally 01/21/2022: No significant stenosis of internal carotid arteries.  TTE 01/21/2022: Left ventricular ejection fraction 40 to 45% with global hypokinesis, no thrombus, no vegetations, no PFO    ASSESSMENT/PLAN: 71 year old male presented with left-sided weakness, slurred speech and feeling off balance.  Acute ischemic stroke, right pons Diabetes Hyperlipidemia -Etiology:  Embolic  Recommendations: -Recommend aspirin 81 mg daily and Plavix 75 mg daily for 3 weeks followed by plavix 75mg  daily -Recommend statin 40 mg daily -Recommend 30-day event monitor to approximately 5 -Monitor patient on stroke risk factors -Stroke education including BEFAST -Goal blood pressure: Permissive hypertension upto 220/110 for 24 hours followed by gradual normalization tomorrow with goal normotension -PT/OT -NIHSS per stroke protocol -Follow-up with neurology in 3 months -Discussed plan with patient at bedside and Dr. via secure chat  Thank you for allowing Gwenlyn Perking to participate in the care of this patient. If you have any further questions, please contact  me or neurohospitalist.   [place] [facility] neurohospitalist

## 2022-01-21 NOTE — TOC Progression Note (Signed)
Transition of Care [facility]) - Progression Note    Patient Details  Name: Randy [name] MRN: [id number] Date of Birth: 1950-06-28  Transition of [facility]) CM/SW Contact  Boneta Lucks, RN Phone Number: 01/21/2022, 1:27 PM  Clinical Narrative:  Patient admitted for CVA work-up. PT is recommending CIR. TOC following for bed status.  [place] notification completed # [id number]    Expected Discharge Plan: IP Rehab Facility Barriers to Discharge: Continued Medical Work up  Expected Discharge Plan and Services Expected Discharge Plan: [place]      Living arrangements for the past 2 months: Apartment

## 2022-01-21 NOTE — ED Notes (Signed)
Pt provided PO Fluids and snack. Pt tolerating food w/o complication.

## 2022-01-21 NOTE — Assessment & Plan Note (Addendum)
-   Continue nicotine patch - Counseled on importance of cessation

## 2022-01-21 NOTE — Evaluation (Signed)
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/01/2017   Perennial allergic rhinitis 10/01/2016   Chronic urticaria 10/01/2016   Stiffness of joints, not elsewhere classified, multiple sites 06/07/2013   Cervical pain (neck) 06/07/2013   Spasm of muscle 06/07/2013   Past Medical History:  Past Medical History:  Diagnosis Date   Angio-edema    Blepharospasm    BPH (benign prostatic hyperplasia)    Cervical myelopathy (HCC)    Glaucoma 2021   Hyperlipidemia    Nephrolithiasis    Optic neuropathy    Right   Urticaria    Past Surgical History:  Past Surgical History:  Procedure Laterality Date   NECK SURGERY  2015   TONSILLECTOMY     HPI:  Randy [name] is a 71 y.o. male with history of hyperlipidemia who presented with sudden onset of slurred speech and left-sided weakness.  Patient reports he was drinking alcohol and around midnight noticed slurred speech.  At around 3 AM he noticed that his balance was off and he was falling backwards.  This morning when he woke up he noticed the left side of his body including hand and leg were weak and therefore he came to the hospital.  Reports taking aspirin 81 mg daily. MRI: MRI brain without contrast 01/21/2022: Acute nonhemorrhagic right paramedian pontine infarct measures up to 9 mm. Moderate generalized atrophy. Degenerative changes in the upper cervical spine with at least mild central canal narrowing at C3-4. SLE ordered as part of stroke protocol.   Assessment / Plan / Recommendation Clinical Impression  Pt presents with mild to mild/mod  cognitive linguistic deficits and dysarthria characterized by impulsivity, impaired attention and working memory skills which negatively impact auditory comprehension, mildly reduced speech intelligibility due to left facial weakness, and suspected impaired pragmatics with/without left inattention. Pt and spouse only endorse changes in speech intelligibility and left arm/leg weakness. Pt scored a 20/30 on the [facility] with deficits in delayed recall, problem solving/mental calcuations, and attention. Pt reports that he has an exceptional IQ, speaks several languages, and spends his days "drinking, listenting to music, and watching TV". Recommend ongoing SLP services in the next venue to address the above deficits. Pt was encouraged to swallow more frequently to control pooling of secretions in left buccal cavity (Pt passed [place]), over articulate, and decrease rate when speaking to increase speech intelligibility.    SLP Assessment  SLP Recommendation/Assessment: Patient needs continued Speech Lanaguage Pathology Services SLP Visit Diagnosis: Dysarthria and anarthria (R47.1);Cognitive communication deficit (R41.841)    Recommendations for follow up therapy are one component of a multi-disciplinary discharge planning process, led by the attending physician.  Recommendations may be updated based on patient status, additional functional criteria and insurance authorization.    Follow Up Recommendations  Acute inpatient rehab (3hours/day)    Assistance Recommended at Discharge  Frequent or constant Supervision/Assistance  Functional Status Assessment Patient has had a recent decline in their functional status and demonstrates the ability to make significant improvements in function in a reasonable and predictable amount of time.  Frequency and Duration min 2x/week  1 week      SLP  Evaluation Cognition  Overall Cognitive Status: Impaired/Different from baseline Arousal/Alertness:  Awake/alert Orientation Level: Oriented to person;Oriented to place;Oriented to situation Year: 2023 Month: October Day of Week: Incorrect Attention: Sustained Sustained Attention: Impaired Sustained Attention Impairment: Verbal complex Memory: Impaired Memory Impairment: Storage deficit Awareness: Impaired Awareness Impairment: Emergent impairment Problem Solving: Impaired Problem Solving Impairment: Verbal complex Executive Function: Self Monitoring Behaviors: Impulsive Safety/Judgment:  (to be determined)       Comprehension  Auditory Comprehension Overall Auditory Comprehension: Impaired Yes/No Questions: Within Functional Limits Commands: Impaired Complex Commands: 75-100% accurate Conversation: Complex Interfering Components: Attention EffectiveTechniques:  (verbal cues for Pt to listen to entire request before answering) Visual Recognition/Discrimination Discrimination: Within Function Limits Reading Comprehension Reading Status: Within funtional limits    Expression Expression Primary Mode of Expression: Verbal Verbal Expression Overall Verbal Expression: Appears within functional limits for tasks assessed Initiation: No impairment Automatic Speech: Name;Social Response Level of Generative/Spontaneous Verbalization: Conversation Repetition: No impairment Naming: No impairment Pragmatics: Impairment Impairments: Abnormal affect;Eye contact Non-Verbal Means of Communication: Not applicable Written Expression Dominant Hand: Right Written Expression: Not tested   Oral / Motor  Motor Speech Overall Motor Speech: Impaired Respiration: Within functional limits Phonation: Normal Resonance: Within functional limits Articulation: Impaired Level of Impairment: Conversation Intelligibility: Intelligibility reduced Word: 75-100% accurate Phrase: 75-100% accurate Sentence: 75-100% accurate Conversation: 75-100% accurate Motor Planning: Witnin functional  limits Motor Speech Errors: Aware Effective Techniques: Slow rate;Over-articulate;Pause           Thank you,  Genene Churn, [place]  [place] 01/21/2022, 5:26 PM

## 2022-01-21 NOTE — Progress Notes (Signed)
Patient seen and examined; admitted after midnight secondary to slurred speech and left-sided weakness.  CT scan of the head was negative for acute intracranial normalities; MRI demonstrating acute nonhemorrhagic right-sided paramedian pontine stroke.  Hemodynamically stable and reporting some improvement in his ability to speak overall.  Still on -Decrease left-sided muscle strength (arm more than leg) and having difficulty finding words.  Patient reports no headaches, no blurred vision, no chest pain, palpitation and no shortness of breath.  Please refer to H&P written by Dr. Clearence Ped for further info/details on admission.  Plan: -Completed stroke work-up -Follow neurology service recommendation -Patient has passed bedside swallowing screening; will advance diet. -Follow PT/OT and SPL recommendations. -continue supportive care -allowing for permissive HTN -Aspirin and Plavix has been started for secondary prevention.  Barton Dubois MD [phone]

## 2022-01-21 NOTE — Plan of Care (Signed)
  Problem: Acute Rehab PT Goals(only PT should resolve) Goal: Pt Will Go Supine/Side To Sit Outcome: Progressing Flowsheets (Taken 01/21/2022 1220) Pt will go Supine/Side to Sit: Independently Goal: Patient Will Transfer Sit To/From Stand Outcome: Progressing Flowsheets (Taken 01/21/2022 1220) Patient will transfer sit to/from stand: Independently Goal: Pt Will Transfer Bed To Chair/Chair To Bed Outcome: Progressing Flowsheets (Taken 01/21/2022 1220) Pt will Transfer Bed to Chair/Chair to Bed: with supervision Goal: Pt Will Ambulate Outcome: Progressing Flowsheets (Taken 01/21/2022 1220) Pt will Ambulate:  > 125 feet  with min guard assist  with supervision  with rolling walker  Zigmund Gottron, SPT

## 2022-01-21 NOTE — Assessment & Plan Note (Signed)
-   Patient reports that he drinks most nights - Cessation counseling provided -CIWA protocol initiated -Continue thiamine and folic acid -No withdrawal symptoms currently appreciated.

## 2022-01-22 ENCOUNTER — Encounter (HOSPITAL_COMMUNITY): Payer: Self-pay | Admitting: Family Medicine

## 2022-01-22 DIAGNOSIS — Z720 Tobacco use: Secondary | ICD-10-CM | POA: Diagnosis not present

## 2022-01-22 DIAGNOSIS — I10 Essential (primary) hypertension: Secondary | ICD-10-CM | POA: Diagnosis not present

## 2022-01-22 DIAGNOSIS — F1010 Alcohol abuse, uncomplicated: Secondary | ICD-10-CM | POA: Diagnosis not present

## 2022-01-22 DIAGNOSIS — I639 Cerebral infarction, unspecified: Secondary | ICD-10-CM | POA: Diagnosis not present

## 2022-01-22 DIAGNOSIS — I5041 Acute combined systolic (congestive) and diastolic (congestive) heart failure: Secondary | ICD-10-CM

## 2022-01-22 LAB — CBC
HCT: 44.9 % (ref 39.0–52.0)
Hemoglobin: 15.3 g/dL (ref 13.0–17.0)
MCH: 32.7 pg (ref 26.0–34.0)
MCHC: 34.1 g/dL (ref 30.0–36.0)
MCV: 95.9 fL (ref 80.0–100.0)
Platelets: 170 10*3/uL (ref 150–400)
RBC: 4.68 MIL/uL (ref 4.22–5.81)
RDW: 14.3 % (ref 11.5–15.5)
WBC: 4.1 10*3/uL (ref 4.0–10.5)
nRBC: 0 % (ref 0.0–0.2)

## 2022-01-22 LAB — BASIC METABOLIC PANEL
Anion gap: 6 (ref 5–15)
BUN: 9 mg/dL (ref 8–23)
CO2: 26 mmol/L (ref 22–32)
Calcium: 9.3 mg/dL (ref 8.9–10.3)
Chloride: 106 mmol/L (ref 98–111)
Creatinine, Ser: 0.85 mg/dL (ref 0.61–1.24)
GFR, Estimated: >60 mL/min (ref >60)
Glucose, Bld: 135 mg/dL — ABNORMAL HIGH (ref 70–99)
Potassium: 4 mmol/L (ref 3.5–5.1)
Sodium: 138 mmol/L (ref 135–145)

## 2022-01-22 MED ORDER — CARVEDILOL 12.5 MG PO TABS
12.5000 mg | ORAL_TABLET | Freq: Two times a day (BID) | ORAL | Status: DC
  Administered 2022-01-23 – 2022-01-25 (×5): 12.5 mg via ORAL
  Filled 2022-01-22 (×5): qty 1

## 2022-01-22 MED ORDER — LISINOPRIL 10 MG PO TABS
20.0000 mg | ORAL_TABLET | Freq: Every day | ORAL | Status: DC
  Administered 2022-01-22 – 2022-01-25 (×4): 20 mg via ORAL
  Filled 2022-01-22 (×4): qty 2

## 2022-01-22 NOTE — Progress Notes (Signed)
This nurse as called into pt's room with concerns of why he hasn't been offered to take a bath and why his teeth haven't been brushed. Assured pt and his wife that pt will have his teeth brushed and bath would be given. Pt also had questions about medications. Answered all pt's questions and addressed all pt concerns. Educated pt on asking for help when he needs to get up and not to attempt to do it on his own.

## 2022-01-22 NOTE — Evaluation (Signed)
Physical Therapy Evaluation Patient Details Name: Randy [name] MRN: [id number] DOB: 03/25/1951 Today's Date: 01/22/2022  History of Present Illness  Randy [name] is a 71 y.o. male with medical history significant of BPH, hyperlipidemia, hypertension, alcohol abuse, smoking use disorder, and more presents the ED with a chief complaint of weakness.  Patient reports that between 6 PM and 8 PM last night he started having left-sided weakness and slurred speech.  Patient reports he was drinking at the time.  He drank about a liter of wine.  He reports he had no headache.  He felt off balance, and ultimately at 3 AM he fell backwards striking the back of his head.  He was diaphoretic at that time.  He reports he was so weak he could not get up and his wife had to help him.  He did not lose consciousness.  Patient reports that he fell because he felt off balance.  He just put mouthwash in his mouth to gargle, and when throwing his head back his whole body went back.  Patient reports he has had no change in vision.  No change in hearing.  At first he says no numbness or paresthesias, but then he says his left shoulder has some paresthesias.  He reports his speech is slurred and slow.  His speech is still not returned to baseline per his report.  He denies any chest pain or palpitations.  Patient reports that he knew his gait was off, but did not feel like it was an asymmetric weakness issue.  He just felt weak everywhere per his report.  Patient reports he is right-hand dominant.  He had no difficulty swallowing.  He has no other complaints at this time.  Patient reports that up until this point he was in his normal state of health.   Clinical Impression  Patient able to complete several seated LE exercises and demonstrates increased endurance/distance for ambulation in hallway with RW and no loss of balance. Patient tolerated sitting up in chair after therapy. Patient will benefit from continued skilled  physical therapy in hospital and recommended venue below to increase strength, balance, endurance for safe ADLs and gait.     Recommendations for follow up therapy are one component of a multi-disciplinary discharge planning process, led by the attending physician.  Recommendations may be updated based on patient status, additional functional criteria and insurance authorization.  Follow Up Recommendations Acute inpatient rehab (3hours/day)      Assistance Recommended at Discharge Set up Supervision/Assistance  Patient can return home with the following  A little help with walking and/or transfers;A little help with bathing/dressing/bathroom;Help with stairs or ramp for entrance;Assistance with cooking/housework    Equipment Recommendations None recommended by PT  Recommendations for Other Services       Functional Status Assessment Patient has had a recent decline in their functional status and demonstrates the ability to make significant improvements in function in a reasonable and predictable amount of time.     Precautions / Restrictions Precautions Precautions: Fall Restrictions Weight Bearing Restrictions: No      Mobility  Bed Mobility Overal bed mobility: Independent                  Transfers Overall transfer level: Modified independent Equipment used: None Transfers: Sit to/from Stand, Bed to chair/wheelchair/BSC Sit to Stand: Modified independent (Device/Increase time), Supervision   Step pivot transfers: Modified independent (Device/Increase time), Supervision       General transfer comment: patient slightly unsteady on  feet, able to transfer to chair without AD    Ambulation/Gait Ambulation/Gait assistance: Min guard, Supervision Gait Distance (Feet): 60 Feet Assistive device: Rolling walker (2 wheels) Gait Pattern/deviations: Decreased step length - right, Decreased step length - left, Decreased stride length Gait velocity: decreased      General Gait Details: patient ambulated in hallway with RW, no loss of balance with CGA assist/supervision  Stairs            Wheelchair Mobility    Modified Rankin (Stroke Patients Only)       Balance                                             Pertinent Vitals/Pain Pain Assessment Pain Assessment: No/denies pain    Home Living Family/patient expects to be discharged to:: Private residence Living Arrangements: Spouse/significant other Available Help at Discharge: Family;Available PRN/intermittently Type of Home: Apartment Home Access: Stairs to enter Entrance Stairs-Rails: Right;Left;Can reach both Entrance Stairs-Number of Steps: 12   Home Layout: One level Home Equipment: Cane - single point;BSC/3in1      Prior Function Prior Level of Function : Independent/Modified Independent             Mobility Comments: community ambulator w/o AD, drives ADLs Comments: Independent     Hand Dominance   Dominant Hand: Right    Extremity/Trunk Assessment   Upper Extremity Assessment Upper Extremity Assessment: Defer to OT evaluation    Lower Extremity Assessment Lower Extremity Assessment: Generalized weakness    Cervical / Trunk Assessment Cervical / Trunk Assessment: Normal  Communication   Communication: No difficulties  Cognition Arousal/Alertness: Awake/alert Behavior During Therapy: WFL for tasks assessed/performed Overall Cognitive Status: Within Functional Limits for tasks assessed                                          General Comments      Exercises General Exercises - Lower Extremity Long Arc Quad: Seated, AROM, 15 reps, Both Hip Flexion/Marching: Seated, AROM, 15 reps, Both Toe Raises: Seated, AROM, 15 reps, Both Heel Raises: Seated, AROM, 15 reps, Both   Assessment/Plan    PT Assessment Patient needs continued PT services  PT Problem List Decreased strength;Decreased activity  tolerance;Decreased balance;Decreased mobility       PT Treatment Interventions DME instruction;Balance training;Gait training;Stair training;Functional mobility training;Patient/family education;Therapeutic activities;Therapeutic exercise    PT Goals (Current goals can be found in the Care Plan section)  Acute Rehab PT Goals Patient Stated Goal: return home after inpatient rehab PT Goal Formulation: With patient Time For Goal Achievement: 01/29/22 Potential to Achieve Goals: Good    Frequency Min 3X/week     Co-evaluation               AM-PAC PT "6 Clicks" Mobility  Outcome Measure Help needed turning from your back to your side while in a flat bed without using bedrails?: None Help needed moving from lying on your back to sitting on the side of a flat bed without using bedrails?: None Help needed moving to and from a bed to a chair (including a wheelchair)?: A Little Help needed standing up from a chair using your arms (e.g., wheelchair or bedside chair)?: A Little Help needed to walk in hospital room?: A Little  Help needed climbing 3-5 steps with a railing? : A Lot 6 Click Score: 19    End of Session   Activity Tolerance: Patient tolerated treatment well;Patient limited by fatigue Patient left: in chair;with call bell/phone within reach Nurse Communication: Mobility status PT Visit Diagnosis: Unsteadiness on feet (R26.81);Other abnormalities of gait and mobility (R26.89);Muscle weakness (generalized) (M62.81)    Time: 1497-0263 PT Time Calculation (min) (ACUTE ONLY): 19 min   Charges:     PT Treatments $Therapeutic Exercise: 8-22 mins $Therapeutic Activity: 8-22 mins        Alan Mulder, SPT

## 2022-01-22 NOTE — Progress Notes (Signed)
Progress Note   Patient: Randy [name] YEB:[id number] DOB: 1950-06-29 DOA: 01/20/2022     1 DOS: the patient was seen and examined on 01/22/2022   Brief hospital course: As per H&P written by Dr. Carren Rang on 01/21/22 [name] is a 71 y.o. male with medical history significant of BPH, hyperlipidemia, hypertension, alcohol abuse, smoking use disorder, and more presents the ED with a chief complaint of weakness.  Patient reports that between 6 PM and 8 PM last night he started having left-sided weakness and slurred speech.  Patient reports he was drinking at the time.  He drank about a liter of wine.  He reports he had no headache.  He felt off balance, and ultimately at 3 AM he fell backwards striking the back of his head.  He was diaphoretic at that time.  He reports he was so weak he could not get up and his wife had to help him.  He did not lose consciousness.  Patient reports that he fell because he felt off balance.  He just put mouthwash in his mouth to gargle, and when throwing his head back his whole body went back.  Patient reports he has had no change in vision.  No change in hearing.  At first he says no numbness or paresthesias, but then he says his left shoulder has some paresthesias.  He reports his speech is slurred and slow.  His speech is still not returned to baseline per his report.  He denies any chest pain or palpitations.  Patient reports that he knew his gait was off, but did not feel like it was an asymmetric weakness issue.  He just felt weak everywhere per his report.  Patient reports he is right-hand dominant.  He had no difficulty swallowing.  He has no other complaints at this time.  Patient reports that up until this point he was in his normal state of health.   Patient does smoke 1 pack of cigarettes per day.  He is thought about quitting and has nicotine patches at home but has not started the quitting process yet.  Advised on the importance of cessation.  Patient  drinks alcohol.  He is not able to quantify how much he drinks, but that he does drink most nights of the week.  Last night he drank a liter of wine, sometimes he drinks 1/5 of liquor.  Patient reports he uses no illicit drugs.  He is vaccinated for COVID.  Patient is full code.  Assessment and Plan: * CVA (cerebral vascular accident) (HCC) -Left-sided ataxia, slow slurred speech, and some numbness/left facial droop at time of admission. 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 - Patient loaded with aspirin and Plavix - Plan is to continue aspirin and Plavix daily for 3 weeks; and then daily aspirin on daily basis. -Appreciate neurology service recommendations -Continue statin 40 mg daily -Continue risk factor modifications (stop smoking, stop alcohol consumption and control blood pressure). - PT/OT recommending CIR; will follow on candidacy for safe discharge plans.  ETOH abuse - Patient reports that he drinks most nights - Cessation counseling provided -CIWA protocol initiated -Continue thiamine and folic acid -No withdrawal symptoms currently appreciated.  Tobacco use - Continue nicotine patch - Counseled on importance of cessation  HTN (hypertension) - After 24 hours of permissive hypertension who restart treatment with Coreg and lisinopril (especially given abnormalities found on patient's echo). -Heart healthy/low-sodium diet  discussed with patient.  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  Global hypokinesis and no significant valvular disorder. -Finding most likely associated with known ischemic cardiomyopathy probably driven by alcohol abuse. -Follow daily weights, low-sodium diet and start treatment with carvedilol and lisinopril. -Outpatient follow-up with cardiology  recommended.  Subjective:  No acute withdrawal symptoms present on exam; denies chest pain, no shortness of breath, no nausea, no vomiting.  Continues to have left-sided weakness and some mild aphasia.  Physical Exam: Vitals:   01/22/22 0205 01/22/22 0552 01/22/22 1000 01/22/22 1433  BP: 132/84 (!) 147/78 (!) 142/75 (!) 140/78  Pulse: 64 64 66 68  Resp: 16 18 18 18   Temp: 98 F (36.7 C) 98.2 F (36.8 C) 98.5 F (36.9 C) 98.6 F (37 C)  TempSrc: Oral Oral Oral Oral  SpO2: 97% 100% 100% 100%  Weight:      Height:       General exam: Alert, awake, oriented x 3; no chest pain, no palpitations, no shortness of breath.  Demonstrating some difficulties finding words and articulating during a speech assessment.  Also having left-sided weakness and numbness sensation. Respiratory system: No wheezing, no crackles, good saturation on room air.  No using accessory muscles. Cardiovascular system:RRR.  No rubs, no gallops, no JVD. Gastrointestinal system: Abdomen is nondistended, soft and nontender. No organomegaly or masses felt. Normal bowel sounds heard. Central nervous system: Alert and oriented.  No new focal deficits; still having son mild aphasia and difficulty articulating certain words; left-sided weakness and numbness described. Extremities: No cyanosis or clubbing; no edema. Skin: No petechiae. Psychiatry: Judgement and insight appear normal. Mood & affect appropriate.   Data Reviewed: CBC: WBCs 4.1, hemoglobin 15.3 and platelet count 546 K Basic metabolic panel: Sodium 568, potassium 4.0, chloride 106, bicarb 26, BUN 9, creatinine 0.85 2D echo: 1. Left ventricular ejection fraction, by estimation, is 40 to 45%. The  left ventricle has mildly decreased function. The left ventricle  demonstrates global hypokinesis. Left ventricular diastolic parameters are  consistent with Grade I diastolic  dysfunction (impaired relaxation).   2. Right ventricular systolic function is mildly  reduced. The right  ventricular size is normal. Tricuspid regurgitation signal is inadequate  for assessing PA pressure.   3. The mitral valve is grossly normal. Trivial mitral valve  regurgitation. No evidence of mitral stenosis.   4. The aortic valve is tricuspid. There is moderate calcification of the  aortic valve. There is mild thickening of the aortic valve. Aortic valve  regurgitation is not visualized. Aortic valve sclerosis/calcification is  present, without any evidence of  aortic stenosis.   5. The inferior vena cava is normal in size with greater than 50%  respiratory variability, suggesting right atrial pressure of 3 mmHg.   Family Communication: Wife at bedside  Disposition: Status is: Inpatient Remains inpatient appropriate because: Completing stroke work-up and stabilizing his condition with further assessment to further determine safest discharge pathway.   Planned Discharge Destination: To be determined; possible CIR at discharge.   Author: Barton Dubois, MD 01/22/2022 4:31 PM  For on call review [email or web address].

## 2022-01-22 NOTE — Progress Notes (Signed)
Inpatient Rehabilitation Admissions Coordinator   Patient is min guard to supervision today with PT. He may progress to discharge directly home if he continues to progress. I will follow up with his progress in next 24 to 48 hrs.  Danne Baxter, RN, MSN Rehab Admissions Coordinator [phone] 01/22/2022 4:33 PM

## 2022-01-22 NOTE — Evaluation (Signed)
Occupational Therapy Evaluation Patient Details Name: Randy [name] MRN: [id number] DOB: 12-Oct-1950 Today's Date: 01/22/2022   History of Present Illness Randy [name] is a 71 y.o. male with medical history significant of BPH, hyperlipidemia, hypertension, alcohol abuse, smoking use disorder, and more presents the ED with a chief complaint of weakness.  Patient reports that between 6 PM and 8 PM last night he started having left-sided weakness and slurred speech.  Patient reports he was drinking at the time.  He drank about a liter of wine.  He reports he had no headache.  He felt off balance, and ultimately at 3 AM he fell backwards striking the back of his head.  He was diaphoretic at that time.  He reports he was so weak he could not get up and his wife had to help him.  He did not lose consciousness.  Patient reports that he fell because he felt off balance.  He just put mouthwash in his mouth to gargle, and when throwing his head back his whole body went back.  Patient reports he has had no change in vision.  No change in hearing.  At first he says no numbness or paresthesias, but then he says his left shoulder has some paresthesias.  He reports his speech is slurred and slow.  His speech is still not returned to baseline per his report.  He denies any chest pain or palpitations.  Patient reports that he knew his gait was off, but did not feel like it was an asymmetric weakness issue.  He just felt weak everywhere per his report.  Patient reports he is right-hand dominant.  He had no difficulty swallowing.  He has no other complaints at this time.  Patient reports that up until this point he was in his normal state of health.   Clinical Impression   Pt agreeable to OT evaluation. Pt independent at baseline with PRN support at home. Pt presents with decreased coordination and strength in L UE. Pt has rotatory cuff tear of L shoulder at baseline but presents with new deficits most pronounced in  regards to decreased coordination and A/ROM of digits. Pt able to transfer with min G to min A due to instability and safety awareness to reach back for chair. Mild assist to boost from bed as well. Pt left in chair with call bell within reach. Pt will benefit from continued OT in the hospital and recommended venue below to increase strength, balance, and endurance for safe ADL's.         Recommendations for follow up therapy are one component of a multi-disciplinary discharge planning process, led by the attending physician.  Recommendations may be updated based on patient status, additional functional criteria and insurance authorization.   Follow Up Recommendations  Acute inpatient rehab (3hours/day)    Assistance Recommended at Discharge Intermittent Supervision/Assistance  Patient can return home with the following A little help with walking and/or transfers;A little help with bathing/dressing/bathroom;Assistance with cooking/housework;Assist for transportation;Help with stairs or ramp for entrance    Functional Status Assessment  Patient has had a recent decline in their functional status and demonstrates the ability to make significant improvements in function in a reasonable and predictable amount of time.  Equipment Recommendations  None recommended by OT    Recommendations for Other Services       Precautions / Restrictions Precautions Precautions: Fall Restrictions Weight Bearing Restrictions: No      Mobility Bed Mobility Overal bed mobility: Modified Independent  General bed mobility comments: labored movement    Transfers Overall transfer level: Needs assistance Equipment used: Rolling walker (2 wheels) Transfers: Sit to/from Stand, Bed to chair/wheelchair/BSC Sit to Stand: Min guard, Min assist     Step pivot transfers: Min guard, Min assist     General transfer comment: Labored movement with mild assist to boost from EOB. Pt noted to plop  down in chair need min A. Cuing needed for proper transfers for safety.      Balance Overall balance assessment: Needs assistance Sitting-balance support: Bilateral upper extremity supported, Feet supported Sitting balance-Leahy Scale: Good Sitting balance - Comments: seated EOB   Standing balance support: Bilateral upper extremity supported, During functional activity Standing balance-Leahy Scale: Fair Standing balance comment: fair with RW                           ADL either performed or assessed with clinical judgement   ADL Overall ADL's : Needs assistance/impaired Eating/Feeding: Set up;Sitting   Grooming: Minimal assistance;Sitting   Upper Body Bathing: Min guard;Minimal assistance;Sitting   Lower Body Bathing: Minimal assistance;Moderate assistance   Upper Body Dressing : Min guard;Minimal assistance   Lower Body Dressing: Minimal assistance;Moderate assistance;Sitting/lateral leans Lower Body Dressing Details (indicate cue type and reason): Pt able to doff socks but needing assist to don L sock. Much time and labored movement. Toilet Transfer: Min guard;Minimal assistance;Ambulation;Rolling walker (2 wheels) Toilet Transfer Details (indicate cue type and reason): Chair to toilet and back with RW. Toileting- Water quality scientist and Hygiene: Min guard;Sitting/lateral lean;Minimal assistance   Tub/ Shower Transfer: Min guard;Minimal assistance;Ambulation   Functional mobility during ADLs: Minimal assistance;Min guard;Rolling walker (2 wheels)       Vision Baseline Vision/History: 1 Wears glasses Ability to See in Adequate Light: 1 Impaired Patient Visual Report: Other (comment);No change from baseline (Pt sees different shades of color out of R eye at baseline. No change.) Vision Assessment?: No apparent visual deficits                Pertinent Vitals/Pain Pain Assessment Pain Assessment: No/denies pain     Hand Dominance Right    Extremity/Trunk Assessment Upper Extremity Assessment Upper Extremity Assessment: LUE deficits/detail LUE Deficits / Details: Pt limited to near 50% A/ROM of shoulder at baseline due to rotator cuff tear that is ~71 years old. Today pt limited to 50% A/ROM of shoulder abduction; 4+/5 elbow flexion; 4/5 elbow extension; 4+/5 wrist flexion; 5/5 wrist extension; 3+/5 grip; Composit extension limited 2+/5. LUE Sensation: WNL LUE Coordination: decreased fine motor   Lower Extremity Assessment Lower Extremity Assessment: Defer to PT evaluation   Cervical / Trunk Assessment Cervical / Trunk Assessment: Normal   Communication Communication Communication: No difficulties   Cognition Arousal/Alertness: Awake/alert Behavior During Therapy: WFL for tasks assessed/performed Overall Cognitive Status: Within Functional Limits for tasks assessed                                                        Home Living Family/patient expects to be discharged to:: Private residence Living Arrangements: Spouse/significant other Available Help at Discharge: Family;Available PRN/intermittently Type of Home: Apartment Home Access: Stairs to enter Entrance Stairs-Number of Steps: 12 Entrance Stairs-Rails: Right;Left;Can reach both Home Layout: One level     Bathroom Shower/Tub: Walk-in shower  Bathroom Toilet: Pharmacist, community: Yes   Home Equipment: Cane - single point;BSC/3in1   Additional Comments: Per PT note  Lives With: Spouse    Prior Functioning/Environment Prior Level of Function : Independent/Modified Independent             Mobility Comments: patient is a Tourist information centre manager, drives, 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 ADLs Comments: Independent (per PT)        OT Problem List: Decreased strength;Decreased range of motion;Decreased activity tolerance;Impaired balance (sitting and/or standing);Decreased coordination;Impaired UE functional use      OT  Treatment/Interventions: Self-care/ADL training;Therapeutic exercise;Therapeutic activities;Patient/family education;Balance training;DME and/or AE instruction;Neuromuscular education    OT Goals(Current goals can be found in the care plan section) Acute Rehab OT Goals Patient Stated Goal: Return home OT Goal Formulation: With patient Time For Goal Achievement: 02/05/22 Potential to Achieve Goals: Good  OT Frequency: Min 2X/week                                   End of Session Equipment Utilized During Treatment: Rolling walker (2 wheels) Nurse Communication: Other (comment) (Notified pt in chair)  Activity Tolerance: Patient tolerated treatment well Patient left: in chair;with call bell/phone within reach  OT Visit Diagnosis: Unsteadiness on feet (R26.81);Other abnormalities of gait and mobility (R26.89);Muscle weakness (generalized) (M62.81);Hemiplegia and hemiparesis;Other symptoms and signs involving the nervous system (R29.898);History of falling (Z91.81) Hemiplegia - Right/Left: Left Hemiplegia - dominant/non-dominant: Non-Dominant Hemiplegia - caused by: Cerebral infarction                Time: 5852-7782 OT Time Calculation (min): 21 min Charges:  OT General Charges $OT Visit: 1 Visit OT Evaluation $OT Eval Low Complexity: 1 Low  Dan Scearce OT, MOT  Danie Chandler 01/22/2022, 10:58 AM

## 2022-01-22 NOTE — Pre-Procedure Instructions (Signed)
I was unable to get patient on the phone, I called his wife, Solmon Ice. She states he was admitted yesterday with slurred speech and possible CVA. I messaged Dr Jenetta Downer  with this information.

## 2022-01-22 NOTE — Plan of Care (Signed)
  Problem: Acute Rehab OT Goals (only OT should resolve) Goal: Pt. Will Perform Grooming Flowsheets (Taken 01/22/2022 1101) Pt Will Perform Grooming:  with modified independence  standing Goal: Pt. Will Perform Lower Body Bathing Flowsheets (Taken 01/22/2022 1101) Pt Will Perform Lower Body Bathing:  with modified independence  sitting/lateral leans Goal: Pt. Will Perform Upper Body Dressing Flowsheets (Taken 01/22/2022 1101) Pt Will Perform Upper Body Dressing:  with modified independence  sitting Goal: Pt. Will Perform Lower Body Dressing Flowsheets (Taken 01/22/2022 1101) Pt Will Perform Lower Body Dressing:  with modified independence  sitting/lateral leans Goal: Pt. Will Transfer To Toilet Flowsheets (Taken 01/22/2022 1101) Pt Will Transfer to Toilet:  with modified independence  Independently  ambulating Goal: Pt. Will Perform Toileting-Clothing Manipulation Flowsheets (Taken 01/22/2022 1101) Pt Will Perform Toileting - Clothing Manipulation and hygiene:  with modified independence  Independently  sitting/lateral leans Goal: Pt/Caregiver Will Perform Home Exercise Program Flowsheets (Taken 01/22/2022 1101) Pt/caregiver will Perform Home Exercise Program:  Increased ROM  Increased strength  Left upper extremity  Independently  Jameila Keeny OT, MOT

## 2022-01-23 ENCOUNTER — Ambulatory Visit (HOSPITAL_COMMUNITY)
Admission: RE | Admit: 2022-01-23 | Payer: No Typology Code available for payment source | Source: Home / Self Care | Admitting: Gastroenterology

## 2022-01-23 ENCOUNTER — Encounter (HOSPITAL_COMMUNITY): Admission: RE | Payer: Self-pay | Source: Home / Self Care

## 2022-01-23 DIAGNOSIS — F1010 Alcohol abuse, uncomplicated: Secondary | ICD-10-CM | POA: Diagnosis not present

## 2022-01-23 DIAGNOSIS — Z720 Tobacco use: Secondary | ICD-10-CM | POA: Diagnosis not present

## 2022-01-23 DIAGNOSIS — I639 Cerebral infarction, unspecified: Secondary | ICD-10-CM | POA: Diagnosis not present

## 2022-01-23 DIAGNOSIS — I10 Essential (primary) hypertension: Secondary | ICD-10-CM | POA: Diagnosis not present

## 2022-01-23 SURGERY — COLONOSCOPY WITH PROPOFOL
Anesthesia: Monitor Anesthesia Care

## 2022-01-23 NOTE — Progress Notes (Signed)
Physical Therapy Treatment Patient Details Name: Randy [name] MRN: [id number] DOB: Dec 24, 1950 Today's Date: 01/23/2022   History of Present Illness Randy [name] is a 71 y.o. male with medical history significant of BPH, hyperlipidemia, hypertension, alcohol abuse, smoking use disorder, and more presents the ED with a chief complaint of weakness.  Patient reports that between 6 PM and 8 PM last night he started having left-sided weakness and slurred speech.  Patient reports he was drinking at the time.  He drank about a liter of wine.  He reports he had no headache.  He felt off balance, and ultimately at 3 AM he fell backwards striking the back of his head.  He was diaphoretic at that time.  He reports he was so weak he could not get up and his wife had to help him.  He did not lose consciousness.  Patient reports that he fell because he felt off balance.  He just put mouthwash in his mouth to gargle, and when throwing his head back his whole body went back.  Patient reports he has had no change in vision.  No change in hearing.  At first he says no numbness or paresthesias, but then he says his left shoulder has some paresthesias.  He reports his speech is slurred and slow.  His speech is still not returned to baseline per his report.  He denies any chest pain or palpitations.  Patient reports that he knew his gait was off, but did not feel like it was an asymmetric weakness issue.  He just felt weak everywhere per his report.  Patient reports he is right-hand dominant.  He had no difficulty swallowing.  He has no other complaints at this time.  Patient reports that up until this point he was in his normal state of health.    PT Comments    Tolerated treatment well. Progressed dynamic gait challenges. Completed BERG balance test with score of 31 (<36 = High risk for falls (close to 100%)). Min guard ambulating with RW in hallway, some trouble navigating congested areas in room and bathroom. Min  assist without RW, showing several episodes of LOB requiring physical intervention due to ataxia and staggering towards left side/scissoring. Feel that patient would benefit from comprehensive AIR for optimal outcomes prior to d/c home with wife at a more independent and safe level of ability, Patient will continue to benefit from skilled physical therapy services to further improve independence with functional mobility.    Recommendations for follow up therapy are one component of a multi-disciplinary discharge planning process, led by the attending physician.  Recommendations may be updated based on patient status, additional functional criteria and insurance authorization.  Follow Up Recommendations  Acute inpatient rehab (3hours/day)     Assistance Recommended at Discharge Set up Supervision/Assistance  Patient can return home with the following A little help with walking and/or transfers;A little help with bathing/dressing/bathroom;Help with stairs or ramp for entrance;Assistance with cooking/housework;Assist for transportation   Equipment Recommendations  None recommended by PT (TBD next venue of care)    Recommendations for Other Services       Precautions / Restrictions Precautions Precautions: Fall Restrictions Weight Bearing Restrictions: No     Mobility  Bed Mobility Overal bed mobility: Independent             General bed mobility comments: labored movement    Transfers Overall transfer level: Needs assistance Equipment used: Rolling walker (2 wheels) Transfers: Sit to/from Stand Sit to Stand: Supervision  Step pivot transfers: Supervision       General transfer comment: Supervision for safety. [place] [employer] Ataxic LUE reaching for RW to stabilize. Performed from bed and recliner today. Mild instability improves notably with use of RW for support upon standing.    Ambulation/Gait Ambulation/Gait assistance: Min assist, Min guard Gait Distance  (Feet): 85 Feet Assistive device: Rolling walker (2 wheels), 1 person hand held assist Gait Pattern/deviations: Decreased step length - right, Decreased step length - left, Decreased stride length, Ataxic, Staggering left, Scissoring Gait velocity: decreased Gait velocity interpretation: <1.31 ft/sec, indicative of household ambulator   General Gait Details: Min guard ambulating with RW for gait training, intermittent cues 55 feet in hallway, instruction for walker proximity, upright posture and symmetry of steps. Trialed ambulating without AD, pt requires min assist with intermittent LOB requiring physical assist to correct, ataxia more pronounced, wider BOS, ocassional scissoring and staggering towards left side.   Stairs             Wheelchair Mobility    Modified Rankin (Stroke Patients Only) Modified Rankin (Stroke Patients Only) Pre-Morbid Rankin Score: No symptoms Modified Rankin: Moderately severe disability     Balance Overall balance assessment: Needs assistance Sitting-balance support: Bilateral upper extremity supported, Feet supported Sitting balance-Leahy Scale: Good Sitting balance - Comments: seated EOB   Standing balance support: During functional activity, No upper extremity supported Standing balance-Leahy Scale: Fair                   Standardized Balance Assessment Standardized Balance Assessment : Berg Balance Test Berg Balance Test Sit to Stand: Able to stand using hands after several tries Standing Unsupported: Able to stand 2 minutes with supervision Sitting with Back Unsupported but Feet Supported on Floor or Stool: Able to sit safely and securely 2 minutes Stand to Sit: Controls descent by using hands Transfers: Needs one person to assist Standing Unsupported with Eyes Closed: Able to stand 10 seconds with supervision Standing Ubsupported with Feet Together: Able to place feet together independently and stand for 1 minute with  supervision From Standing, Reach Forward with Outstretched Arm: Can reach forward >12 cm safely (5") From Standing Position, Pick up Object from Floor: Able to pick up shoe, needs supervision From Standing Position, Turn to Look Behind Over each Shoulder: Needs supervision when turning Turn 360 Degrees: Needs close supervision or verbal cueing Standing Unsupported, Alternately Place Feet on Step/Stool: Able to complete >2 steps/needs minimal assist Standing Unsupported, One Foot in Front: Able to take small step independently and hold 30 seconds Standing on One Leg: Tries to lift leg/unable to hold 3 seconds but remains standing independently Total Score: 31        Cognition Arousal/Alertness: Awake/alert Behavior During Therapy: WFL for tasks assessed/performed Overall Cognitive Status: Impaired/Different from baseline Area of Impairment: Problem solving, Following commands                       Following Commands: Follows multi-step commands inconsistently, Follows one step commands inconsistently     Problem Solving: Slow processing, Requires verbal cues General Comments: Occasional repetition required for instructions with multi-step tasks.        Exercises      General Comments        Pertinent Vitals/Pain Pain Assessment Pain Assessment: No/denies pain    Home Living  Prior Function            PT Goals (current goals can now be found in the care plan section) Acute Rehab PT Goals Patient Stated Goal: return home after inpatient rehab PT Goal Formulation: With patient Time For Goal Achievement: 01/29/22 Potential to Achieve Goals: Good Progress towards PT goals: Progressing toward goals    Frequency    Min 3X/week      PT Plan Current plan remains appropriate    Co-evaluation              AM-PAC PT "6 Clicks" Mobility   Outcome Measure  Help needed turning from your back to your side while in a  flat bed without using bedrails?: None Help needed moving from lying on your back to sitting on the side of a flat bed without using bedrails?: None Help needed moving to and from a bed to a chair (including a wheelchair)?: A Little Help needed standing up from a chair using your arms (e.g., wheelchair or bedside chair)?: A Little Help needed to walk in hospital room?: A Little Help needed climbing 3-5 steps with a railing? : A Lot 6 Click Score: 19    End of Session Equipment Utilized During Treatment: Gait belt Activity Tolerance: Patient tolerated treatment well Patient left: with call bell/phone within reach;in bed;with family/visitor present   PT Visit Diagnosis: Unsteadiness on feet (R26.81);Other abnormalities of gait and mobility (R26.89);Muscle weakness (generalized) (M62.81)     Time: 3810-1751 PT Time Calculation (min) (ACUTE ONLY): 20 min  Charges:  $Gait Training: 8-22 mins                     Kathlyn Sacramento, PT, DPT Physical Therapist Acute Rehabilitation Services [facility] Out[facility]    Berton Mount 01/23/2022, 10:30 AM

## 2022-01-23 NOTE — Progress Notes (Signed)
Progress Note   Patient: Randy [name] FIE:[id number] DOB: 1950/07/30 DOA: 01/20/2022     2 DOS: the patient was seen and examined on 01/23/2022   Brief hospital course: As per H&P written by Dr. Clearence Ped on 01/21/22 Randy [name] is a 71 y.o. male with medical history significant of BPH, hyperlipidemia, hypertension, alcohol abuse, smoking use disorder, and more presents the ED with a chief complaint of weakness.  Patient reports that between 6 PM and 8 PM last night he started having left-sided weakness and slurred speech.  Patient reports he was drinking at the time.  He drank about a liter of wine.  He reports he had no headache.  He felt off balance, and ultimately at 3 AM he fell backwards striking the back of his head.  He was diaphoretic at that time.  He reports he was so weak he could not get up and his wife had to help him.  He did not lose consciousness.  Patient reports that he fell because he felt off balance.  He just put mouthwash in his mouth to gargle, and when throwing his head back his whole body went back.  Patient reports he has had no change in vision.  No change in hearing.  At first he says no numbness or paresthesias, but then he says his left shoulder has some paresthesias.  He reports his speech is slurred and slow.  His speech is still not returned to baseline per his report.  He denies any chest pain or palpitations.  Patient reports that he knew his gait was off, but did not feel like it was an asymmetric weakness issue.  He just felt weak everywhere per his report.  Patient reports he is right-hand dominant.  He had no difficulty swallowing.  He has no other complaints at this time.  Patient reports that up until this point he was in his normal state of health.   Patient does smoke 1 pack of cigarettes per day.  He is thought about quitting and has nicotine patches at home but has not started the quitting process yet.  Advised on the importance of cessation.  Patient  drinks alcohol.  He is not able to quantify how much he drinks, but that he does drink most nights of the week.  Last night he drank a liter of wine, sometimes he drinks 1/5 of liquor.  Patient reports he uses no illicit drugs.  He is vaccinated for COVID.  Patient is full code.  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 -A1c 5.8 -LDL 90, HDL 33 - Patient loaded with aspirin and Plavix -Neurology consult appreciated -Continue atorvastatin 40 mg daily 01/23/22 -speech improving, left-sided weakness and numbness continues to improve , no additional new focal deficits, no tremors --Continue risk factor modifications (stop smoking, stop alcohol consumption and control blood pressure). - PT/OT recommending CIR;  - take Aspirin 81 mg daily along with Plavix 75 mg daily for 21 days then after that STOP the Plavix  and continue ONLY Aspirin 81 mg daily indefinitely--for secondary stroke Prevention (Per The multicenter SAMMPRIS trial)   2)ETOH abuse - Patient reports that he drinks most nights - Cessation counseling provided -CIWA protocol initiated -Continue thiamine and folic acid -No withdrawal symptoms currently appreciated.  3)Tobacco use - Continue nicotine patch - Counseled on importance of cessation especially in the setting of  acute stroke  4)HTN (hypertension) -Initially permissive hypertension was allowed -Carvedilol lisinopril has been restarted  -Heart healthy/low-sodium diet discussed with patient.  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  Global hypokinesis and no significant valvular disorder. -Finding most likely associated with known ischemic cardiomyopathy most likely EtOH  abuse contributed to  this ---lisinopril and Coreg as above #4 -Outpatient follow-up cardiologist advised  Subjective:  speech improving, left-sided weakness and numbness continues to improve , no additional new focal deficits, no tremors -No chest pains and palpitations no dyspnea on exertion -Oral intake is fair  Physical Exam: Vitals:   01/22/22 2201 01/23/22 0209 01/23/22 0556 01/23/22 1227  BP: (!) 143/85 (!) 163/83 (!) 158/89 131/68  Pulse: 60 (!) 56 (!) 50 (!) 59  Resp: 16 19 20 18   Temp: 98.4 F (36.9 C) 97.9 F (36.6 C) 98.2 F (36.8 C) 98 F (36.7 C)  TempSrc: Oral Oral Oral Oral  SpO2: 96% 99% 98% 98%  Weight:   95.6 kg   Height:        Physical Exam  Gen:- Awake Alert, in no acute distress  HEENT:- [place].AT, No sclera icterus Neck-Supple Neck,No JVD,.  Lungs-  CTAB , fair air movement bilaterally  CV- S1, S2 normal, RRR Abd-  +ve B.Sounds, Abd Soft, No tenderness,    Extremity/Skin:- No  edema,   good pedal pulses  Psych-affect is appropriate, oriented x3 Neuro-speech improving, left-sided weakness and numbness continues to improve , no additional new focal deficits, no tremors   Data Reviewed: 2D echo: 1. Left ventricular ejection fraction, by estimation, is 40 to 45%. The  left ventricle has mildly decreased function. The left ventricle  demonstrates global hypokinesis. Left ventricular diastolic parameters are  consistent with Grade I diastolic  dysfunction (impaired relaxation).   2. Right ventricular systolic function is mildly reduced. The right  ventricular size is normal. Tricuspid regurgitation signal is inadequate  for assessing PA pressure.   3. The mitral valve is grossly normal. Trivial mitral valve  regurgitation. No evidence of mitral stenosis.   4. The aortic valve is tricuspid. There is moderate calcification of the  aortic valve. There is mild thickening of the aortic valve. Aortic valve  regurgitation is not visualized. Aortic valve sclerosis/calcification  is  present, without any evidence of  aortic stenosis.   5. The inferior vena cava is normal in size with greater than 50%  respiratory variability, suggesting right atrial pressure of 3 mmHg.   Family Communication: Wife at bedside  Disposition: CIR inpatient rehab pending insurance approval Status is: Inpatient Remains inpatient appropriate because: Completing stroke work-up and stabilizing his condition with further assessment to further determine safest discharge pathway.   Planned Discharge Destination: To be determined; possible CIR at discharge.   Author: , MD 01/23/2022 4:17 PM  For on call review [email or web address].

## 2022-01-23 NOTE — Progress Notes (Signed)
Inpatient Rehabilitation Admissions Coordinator   I spoke with wife by phone and she and her husband and are in agreement to admit to [facility] CIR. I will begin insurance approval with Carlena Bjornstad for approval.  Danne Baxter, RN, MSN Rehab Admissions Coordinator [phone] 01/23/2022 11:11 AM

## 2022-01-24 DIAGNOSIS — I10 Essential (primary) hypertension: Secondary | ICD-10-CM | POA: Diagnosis not present

## 2022-01-24 DIAGNOSIS — I639 Cerebral infarction, unspecified: Secondary | ICD-10-CM | POA: Diagnosis not present

## 2022-01-24 DIAGNOSIS — F1010 Alcohol abuse, uncomplicated: Secondary | ICD-10-CM | POA: Diagnosis not present

## 2022-01-24 DIAGNOSIS — Z720 Tobacco use: Secondary | ICD-10-CM | POA: Diagnosis not present

## 2022-01-24 NOTE — Progress Notes (Signed)
Progress Note   Patient: Randy [name] FTD:[id number] DOB: Oct 11, 1950 DOA: 01/20/2022     3 DOS: the patient was seen and examined on 01/24/2022   Brief hospital course: As per H&P written by Dr. Clearence Ped on 01/21/22 Aristidis Talerico is a 71 y.o. male with medical history significant of BPH, hyperlipidemia, hypertension, alcohol abuse, smoking use disorder, and more presents the ED with a chief complaint of weakness.  Patient reports that between 6 PM and 8 PM last night he started having left-sided weakness and slurred speech.  Patient reports he was drinking at the time.  He drank about a liter of wine.  He reports he had no headache.  He felt off balance, and ultimately at 3 AM he fell backwards striking the back of his head.  He was diaphoretic at that time.  He reports he was so weak he could not get up and his wife had to help him.  He did not lose consciousness.  Patient reports that he fell because he felt off balance.  He just put mouthwash in his mouth to gargle, and when throwing his head back his whole body went back.  Patient reports he has had no change in vision.  No change in hearing.  At first he says no numbness or paresthesias, but then he says his left shoulder has some paresthesias.  He reports his speech is slurred and slow.  His speech is still not returned to baseline per his report.  He denies any chest pain or palpitations.  Patient reports that he knew his gait was off, but did not feel like it was an asymmetric weakness issue.  He just felt weak everywhere per his report.  Patient reports he is right-hand dominant.  He had no difficulty swallowing.  He has no other complaints at this time.  Patient reports that up until this point he was in his normal state of health.   Patient does smoke 1 pack of cigarettes per day.  He is thought about quitting and has nicotine patches at home but has not started the quitting process yet.  Advised on the importance of cessation.  Patient  drinks alcohol.  He is not able to quantify how much he drinks, but that he does drink most nights of the week.  Last night he drank a liter of wine, sometimes he drinks 1/5 of liquor.  Patient reports he uses no illicit drugs.  He is vaccinated for COVID.  Patient is full code.  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 -A1c 5.8 -LDL 90, HDL 33 - Patient loaded with aspirin and Plavix -Neurology consult appreciated -Continue atorvastatin 40 mg daily 01/24/22 -speech improving, left-sided weakness and numbness continues to improve , no additional new focal deficits, no tremors --Continue risk factor modifications (stop smoking, stop alcohol consumption and control blood pressure). - PT/OT recommending CIR;  - take Aspirin 81 mg daily along with Plavix 75 mg daily for 21 days then after that STOP the Plavix  and continue ONLY Aspirin 81 mg daily indefinitely--for secondary stroke Prevention (Per The multicenter SAMMPRIS trial)   2)ETOH abuse - Patient reports that he drinks most nights - Cessation counseling provided -CIWA protocol initiated -Continue thiamine and folic acid -No withdrawal symptoms currently appreciated.  3)Tobacco use - Continue nicotine patch - Counseled on importance of cessation especially in the setting of  acute stroke  4)HTN (hypertension) -Initially permissive hypertension was allowed -Carvedilol lisinopril has been restarted  -Heart healthy/low-sodium diet discussed with patient.  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  Global hypokinesis and no significant valvular disorder. -Finding most likely associated with known ischemic cardiomyopathy most likely EtOH  abuse contributed to  this ---lisinopril and Coreg as above #4 -Outpatient follow-up cardiologist advised  Subjective:  -Speech and swallowing continues to improve slowly -Patient eagerly awaiting possible transfer to rehab  Physical Exam: Vitals:   01/24/22 0512 01/24/22 0553 01/24/22 0805 01/24/22 1249  BP:  132/76 134/74 138/82  Pulse:  (!) 58 62 (!) 53  Resp:  18  18  Temp:  98.8 F (37.1 C)  98.3 F (36.8 C)  TempSrc:  Oral  Oral  SpO2:  96%  97%  Weight: 95.9 kg 97.2 kg    Height:  5\' 10"  (1.778 m)      Physical Exam  Gen:- Awake Alert, in no acute distress  HEENT:- [place].AT, No sclera icterus Neck-Supple Neck,No JVD,.  Lungs-  CTAB , fair air movement bilaterally  CV- S1, S2 normal, RRR Abd-  +ve B.Sounds, Abd Soft, No tenderness,    Extremity/Skin:- No  edema,   good pedal pulses  Psych-affect is appropriate, oriented x3 Neuro-speech improving, left-sided weakness and numbness continues to improve , no additional new focal deficits, no tremors    Family Communication: Wife previously updated Disposition: CIR inpatient rehab pending insurance approval Status is: Inpatient Remains inpatient appropriate because: Awaiting insurance approval for transfer to CIR rehab   Author: , MD 01/24/2022 7:01 PM  For on call review [email or web address].

## 2022-01-24 NOTE — Plan of Care (Signed)
  Problem: Coping: Goal: Will verbalize positive feelings about self Outcome: Progressing   Problem: Coping: Goal: Will identify appropriate support needs Outcome: Progressing   Problem: Ischemic Stroke/TIA Tissue Perfusion: Goal: Complications of ischemic stroke/TIA will be minimized Outcome: Progressing

## 2022-01-24 NOTE — Progress Notes (Signed)
SLP Cancellation Note  Patient Details Name: Theus Espin MRN: [id number] DOB: 1950/05/23   Cancelled treatment:       Reason Eval/Treat Not Completed: Other (comment) (Pt working with OT); Pt working with OT. SLP will check back as schedule permits to address dysarthria and cognitive deficits.   Thank you,  Genene Churn, [place]    [place] 01/24/2022, 10:02 AM

## 2022-01-24 NOTE — Progress Notes (Addendum)
Inpatient Rehabilitation Admissions Coordinator   I await determination from [facility] care for possible CIR admit.   Danne Baxter, RN, MSN Rehab Admissions Coordinator [phone] 01/24/2022 11:58 AM  I have received insurance approval for Cir admit at [facility] in [place]. I will have a bed Friday. I spoke with his wife by phone and she is in agreement.  In the am I will verify when his room will be available and then I will arrange [facility] transport. Typically I arrange pickup around 1130 to 1230. I will alert acute team and TOC.  Danne Baxter, RN, MSN Rehab Admissions Coordinator [phone] 01/24/2022 3:30 PM

## 2022-01-24 NOTE — Progress Notes (Signed)
Occupational Therapy Treatment Patient Details Name: Randy [name] MRN: [id number] DOB: 05/13/1950 Today's Date: 01/24/2022   History of present illness Randy [name] is a 71 y.o. male with medical history significant of BPH, hyperlipidemia, hypertension, alcohol abuse, smoking use disorder, and more presents the ED with a chief complaint of weakness.  Patient reports that between 6 PM and 8 PM last night he started having left-sided weakness and slurred speech.  Patient reports he was drinking at the time.  He drank about a liter of wine.  He reports he had no headache.  He felt off balance, and ultimately at 3 AM he fell backwards striking the back of his head.  He was diaphoretic at that time.  He reports he was so weak he could not get up and his wife had to help him.  He did not lose consciousness.  Patient reports that he fell because he felt off balance.  He just put mouthwash in his mouth to gargle, and when throwing his head back his whole body went back.  Patient reports he has had no change in vision.  No change in hearing.  At first he says no numbness or paresthesias, but then he says his left shoulder has some paresthesias.  He reports his speech is slurred and slow.  His speech is still not returned to baseline per his report.  He denies any chest pain or palpitations.  Patient reports that he knew his gait was off, but did not feel like it was an asymmetric weakness issue.  He just felt weak everywhere per his report.  Patient reports he is right-hand dominant.  He had no difficulty swallowing.  He has no other complaints at this time.  Patient reports that up until this point he was in his normal state of health.   OT comments  Pt agreeable to OT treatment. Pt demonstrates improved mobility with supervision using RW within the room. L hand noted to have slightly improved dexterity and functional use. Pt able to grasp toothpaste and open without assist. Pt still needing min A for donning  L sock. Pt completing grooming tasks with supervision while standing for over 10 minutes consecutively today. Pt able to shave, brush teeth, and use lotion. Pt is progressing well. Pt will benefit from continued OT in the hospital and recommended venue below to increase strength, balance, and endurance for safe ADL's.       Recommendations for follow up therapy are one component of a multi-disciplinary discharge planning process, led by the attending physician.  Recommendations may be updated based on patient status, additional functional criteria and insurance authorization.    Follow Up Recommendations  Acute inpatient rehab (3hours/day)    Assistance Recommended at Discharge Intermittent Supervision/Assistance  Patient can return home with the following  A little help with walking and/or transfers;A little help with bathing/dressing/bathroom;Assistance with cooking/housework;Assist for transportation;Help with stairs or ramp for entrance   Equipment Recommendations  None recommended by OT    Recommendations for Other Services      Precautions / Restrictions Precautions Precautions: Fall Restrictions Weight Bearing Restrictions: No       Mobility Bed Mobility                    Transfers Overall transfer level: Needs assistance Equipment used: Rolling walker (2 wheels) Transfers: Sit to/from Stand Sit to Stand: Supervision     Step pivot transfers: Supervision     General transfer comment: chair to standing at sink  and back to chair with RW     Balance Overall balance assessment: Needs assistance Sitting-balance support: Bilateral upper extremity supported, Feet supported Sitting balance-Leahy Scale: Good Sitting balance - Comments: seated in chair   Standing balance support: During functional activity, Bilateral upper extremity supported Standing balance-Leahy Scale: Fair Standing balance comment: fair with RW                           ADL  either performed or assessed with clinical judgement   ADL Overall ADL's : Needs assistance/impaired     Grooming: Supervision/safety;Standing;Oral care Grooming Details (indicate cue type and reason): Pt able to stand at sink with RW available to brush teeth, lotion his face, and shave his head and face. Pt stood for ~13 minutes straight.             Lower Body Dressing: Minimal assistance;Sitting/lateral leans Lower Body Dressing Details (indicate cue type and reason): Pt needing assist to don L sock but able to doff both and don R sock without assist.             Functional mobility during ADLs: Supervision/safety;Rolling walker (2 wheels) General ADL Comments: Pt able to ambulate to sink and back to chair with supervision using RW.                  Vision   Additional Comments: L visual field testing with pt covering R eye. No deficits noted.   Perception     Praxis      Cognition Arousal/Alertness: Awake/alert Behavior During Therapy: WFL for tasks assessed/performed Overall Cognitive Status: Within Functional Limits for tasks assessed                                          Exercises Exercises: Other exercises Other Exercises Other Exercises: Weight bearing on L UE with intentional lean for 30 seconds to a minute while standing with hand resting on counter of sink. Pt also bearing weight throughout grooming tasks intermittently.                 Pertinent Vitals/ Pain       Pain Assessment Pain Assessment: No/denies pain                                                          Frequency  Min 2X/week        Progress Toward Goals  OT Goals(current goals can now be found in the care plan section)  Progress towards OT goals: Progressing toward goals  Acute Rehab OT Goals Patient Stated Goal: Return home. OT Goal Formulation: With patient Time For Goal Achievement: 02/05/22 Potential to Achieve  Goals: Good ADL Goals Pt Will Perform Grooming: with modified independence;standing Pt Will Perform Lower Body Bathing: with modified independence;sitting/lateral leans Pt Will Perform Upper Body Dressing: with modified independence;sitting Pt Will Perform Lower Body Dressing: with modified independence;sitting/lateral leans Pt Will Transfer to Toilet: with modified independence;Independently;ambulating Pt Will Perform Toileting - Clothing Manipulation and hygiene: with modified independence;Independently;sitting/lateral leans Pt/caregiver will Perform Home Exercise Program: Increased ROM;Increased strength;Left upper extremity;Independently  Plan Discharge plan remains appropriate  End of Session Equipment Utilized During Treatment: Rolling walker (2 wheels)  OT Visit Diagnosis: Unsteadiness on feet (R26.81);Other abnormalities of gait and mobility (R26.89);Muscle weakness (generalized) (M62.81);Hemiplegia and hemiparesis;Other symptoms and signs involving the nervous system (R29.898);History of falling (Z91.81) Hemiplegia - Right/Left: Left Hemiplegia - dominant/non-dominant: Non-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

## 2022-01-24 NOTE — PMR Pre-admission (Signed)
PMR Admission Coordinator Pre-Admission Assessment  Patient: Randy [name] is an 71 y.o., male MRN: [id number] DOB: [birth year] Height: 5\' 10"  (177.8 cm) Weight: 95.4 kg  Insurance Information HMO:     PPO:      PCP:      IPA:      80/20:      OTHER:  PRIMARY: [facility]      Policy#:      Subscriber: pt CM Name: [id number]      Phone#: [phone] ext [phone]     Fax#: [phone] Pre-Cert#: [id number]      Employer:  Benefits:  Phone #: [phone]     Name: 10/195 Eff. Date: active 10/19     Deduct:       Out of Pocket Max:      CIR: per [facility] guidelines      SNF: per [facility] Outpatient: per [facility]     Co-Pay:  Home Health: per [facility]      Co-Pay:  DME: per [facility]     Co-Pay: Providers: in network  SECONDARY:         Financial Counselor:       Phone#:   The 11/19" for patients in Inpatient Rehabilitation Facilities with attached "Privacy Act Statement-Health Care Records" was provided and verbally reviewed with: N/A  Emergency Contact Information Contact Information     Name Relation Home Work Mobile   Mendel,[name] Spouse   [phone]      Current Medical History  Patient Admitting Diagnosis: CVA  History of Present Illness:  71 year old right-handed male with history significant for BPH, glaucoma, systolic and diastolic heart failure, hyperlipidemia, hypertension, alcohol as well as tobacco use. Presented to [facility] with acute onset of left-sided weakness/dizziness and slurred speech.  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  Moderate generalized atrophy.  MRI of the head and neck moderate to high-grade stenosis proximal M3 segments of the right superior division.  No significant proximal stenosis aneurysm or branch vessel occlusion in the circle of Willis.  Patient did not receive tPA.  Admission chemistries unremarkable except glucose 149, hemoglobin A1c 5.8, alcohol negative, urine  drug screen negative.  Carotid ultrasound with no stenosis.  Echocardiogram with ejection fraction of 40 to 45%.  Left ventricle demonstrating global hypokinesis as well as grade 1 diastolic dysfunction.  Neurology follow-up placed on aspirin 81 mg daily and Plavix 75 mg daily x3 weeks then Plavix alone.  Subcutaneous heparin for DVT prophylaxis.  Permissive hypertension and monitored.  Tolerating a regular diet.     Complete NIHSS TOTAL: 2  Patient's medical record from [facility] has been reviewed by the rehabilitation admission coordinator and physician.  Past Medical History  Past Medical History:  Diagnosis Date   Angio-edema    Blepharospasm    BPH (benign prostatic hyperplasia)    Cervical myelopathy (HCC)    Glaucoma 2021   Hyperlipidemia    Nephrolithiasis    Optic neuropathy    Right   Urticaria    Has the patient had major surgery during 100 days prior to admission? No  Family History   family history includes Arthritis in his sister; Breast cancer in his sister; Dementia in his sister; Healthy in his daughter and son; Heart attack in his father.  Current Medications  Current Facility-Administered Medications:    acetaminophen (TYLENOL) tablet 650 mg, 650 mg, Oral, Q4H PRN **OR** acetaminophen (TYLENOL) 160 MG/5ML solution 650 mg,  650 mg, Per Tube, Q4H PRN **OR** acetaminophen (TYLENOL) suppository 650 mg, 650 mg, Rectal, Q4H PRN, Zierle-Ghosh, Asia B, DO   aspirin EC tablet 81 mg, 81 mg, Oral, QHS, Charlsie QuestYadav, Priyanka O, MD, 81 mg at 01/24/22 2130   atorvastatin (LIPITOR) tablet 40 mg, 40 mg, Oral, Daily, Zierle-Ghosh, Asia B, DO, 40 mg at 01/24/22 1059   carvedilol (COREG) tablet 12.5 mg, 12.5 mg, Oral, BID WC, Vassie LollMadera, Carlos, MD, 12.5 mg at 01/25/22 [id number]   clopidogrel (PLAVIX) tablet 75 mg, 75 mg, Oral, Daily, Charlsie QuestYadav, Priyanka O, MD, 75 mg at 01/24/22 1059   DULoxetine (CYMBALTA) DR capsule 30 mg, 30 mg, Oral, Daily, Zierle-Ghosh, Asia B, DO, 30 mg at 01/24/22  1059   famotidine (PEPCID) tablet 20 mg, 20 mg, Oral, QHS, Zierle-Ghosh, Asia B, DO, 20 mg at 01/24/22 2130   folic acid (FOLVITE) tablet 1 mg, 1 mg, Oral, Daily, Zierle-Ghosh, Asia B, DO, 1 mg at 01/24/22 1059   heparin injection 5,000 Units, 5,000 Units, Subcutaneous, Q8H, Zierle-Ghosh, Asia B, DO, 5,000 Units at 01/25/22 0600   lisinopril (ZESTRIL) tablet 20 mg, 20 mg, Oral, Daily, Vassie LollMadera, Carlos, MD, 20 mg at 01/24/22 1059   multivitamin with minerals tablet 1 tablet, 1 tablet, Oral, Daily, Zierle-Ghosh, Asia B, DO, 1 tablet at 01/24/22 1059   nicotine (NICODERM CQ - dosed in mg/24 hours) patch 21 mg, 21 mg, Transdermal, Daily, Zierle-Ghosh, Asia B, DO, 21 mg at 01/24/22 1059   oxyCODONE (Oxy IR/ROXICODONE) immediate release tablet 5 mg, 5 mg, Oral, Q6H PRN, Zierle-Ghosh, Asia B, DO   pantoprazole (PROTONIX) EC tablet 40 mg, 40 mg, Oral, Daily, Zierle-Ghosh, Asia B, DO, 40 mg at 01/25/22 0600   PARoxetine (PAXIL) tablet 5 mg, 5 mg, Oral, QHS, Zierle-Ghosh, Asia B, DO, 5 mg at 01/24/22 2209   senna-docusate (Senokot-S) tablet 1 tablet, 1 tablet, Oral, QHS PRN, Zierle-Ghosh, Asia B, DO   thiamine (VITAMIN B1) tablet 100 mg, 100 mg, Oral, Daily, 100 mg at 01/24/22 1059 **OR** thiamine (VITAMIN B1) injection 100 mg, 100 mg, Intravenous, Daily, Zierle-Ghosh, Asia B, DO  Patients Current Diet:  Diet Order             Diet Heart Room service appropriate? Yes; Fluid consistency: Thin  Diet effective now                  Precautions / Restrictions Precautions Precautions: Fall Restrictions Weight Bearing Restrictions: No   Has the patient had 2 or more falls or a fall with injury in the past year? No  Prior Activity Level Community (5-7x/wk): independent without AD; drives  Prior Functional Level Self Care: Did the patient need help bathing, dressing, using the toilet or eating? Independent  Indoor Mobility: Did the patient need assistance with walking from room to room (with or  without device)? Independent  Stairs: Did the patient need assistance with internal or external stairs (with or without device)? Independent  Functional Cognition: Did the patient need help planning regular tasks such as shopping or remembering to take medications? Independent  Patient Information Are you of Hispanic, Latino/a,or Spanish origin?: A. No, not of Hispanic, Latino/a, or Spanish origin What is your race?: B. Black or African American Do you need or want an interpreter to communicate with a doctor or health care staff?: 0. No  Patient's Response To:  Health Literacy and Transportation Is the patient able to respond to health literacy and transportation needs?: Yes Health Literacy - How often do you need to  have someone help you when you read instructions, pamphlets, or other written material from your doctor or pharmacy?: Never In the past 12 months, has lack of transportation kept you from medical appointments or from getting medications?: No In the past 12 months, has lack of transportation kept you from meetings, work, or from getting things needed for daily living?: No  Home Assistive Devices / Equipment Home Assistive Devices/Equipment: Medical laboratory scientific officer (specify quad or straight), Eyeglasses Home Equipment: Cane - single point, BSC/3in1  Prior Device Use: Indicate devices/aids used by the patient prior to current illness, exacerbation or injury? None of the above  Current Functional Level Cognition  Arousal/Alertness: Awake/alert Overall Cognitive Status: Within Functional Limits for tasks assessed Orientation Level: Oriented X4, Oriented to person, Oriented to situation, Oriented to place, Oriented to time Following Commands: Follows multi-step commands inconsistently, Follows one step commands inconsistently General Comments: Occasional repetition required for instructions with multi-step tasks. Attention: Sustained Sustained Attention: Impaired Sustained Attention Impairment:  Verbal complex Memory: Impaired Memory Impairment: Storage deficit Awareness: Impaired Awareness Impairment: Emergent impairment Problem Solving: Impaired Problem Solving Impairment: Verbal complex Executive Function: Self Monitoring Behaviors: Impulsive Safety/Judgment:  (to be determined)    Extremity Assessment (includes Sensation/Coordination)  Upper Extremity Assessment: Defer to OT evaluation LUE Deficits / Details: Pt limited to near 50% A/ROM of shoulder at baseline due to rotator cuff tear that is ~71 years old. Today pt limited to 50% A/ROM of shoulder abduction; 4+/5 elbow flexion; 4/5 elbow extension; 4+/5 wrist flexion; 5/5 wrist extension; 3+/5 grip; Composit extension limited 2+/5. LUE Sensation: WNL LUE Coordination: decreased fine motor  Lower Extremity Assessment: Generalized weakness LLE Deficits / Details: patient slightly weaker on L LE w/ hip flexion (MMT 3+/5) LLE Sensation: WNL LLE Coordination: WNL    ADLs  Overall ADL's : Needs assistance/impaired Eating/Feeding: Set up, Sitting Grooming: Supervision/safety, Standing, Oral care Grooming Details (indicate cue type and reason): Pt able to stand at sink with RW available to brush teeth, lotion his face, and shave his head and face. Pt stood for ~13 minutes straight. Upper Body Bathing: Min guard, Minimal assistance, Sitting Lower Body Bathing: Minimal assistance, Moderate assistance Upper Body Dressing : Min guard, Minimal assistance Lower Body Dressing: Minimal assistance, Sitting/lateral leans Lower Body Dressing Details (indicate cue type and reason): Pt needing assist to don L sock but able to doff both and don R sock without assist. Toilet Transfer: Min guard, Minimal assistance, Ambulation, Rolling walker (2 wheels) Toilet Transfer Details (indicate cue type and reason): Chair to toilet and back with RW. Toileting- Architect and Hygiene: Min guard, Sitting/lateral lean, Minimal  assistance Tub/ Shower Transfer: Min guard, Minimal assistance, Ambulation Functional mobility during ADLs: Supervision/safety, Rolling walker (2 wheels) General ADL Comments: Pt able to ambulate to sink and back to chair with supervision using RW.    Mobility  Overal bed mobility: Independent General bed mobility comments: labored movement    Transfers  Overall transfer level: Needs assistance Equipment used: Rolling walker (2 wheels) Transfers: Sit to/from Stand Sit to Stand: Supervision Bed to/from chair/wheelchair/BSC transfer type:: Step pivot Step pivot transfers: Supervision General transfer comment: chair to standing at sink and back to chair with RW    Ambulation / Gait / Stairs / Wheelchair Mobility  Ambulation/Gait Ambulation/Gait assistance: Min assist, Min guard Gait Distance (Feet): 85 Feet Assistive device: Rolling walker (2 wheels), 1 person hand held assist Gait Pattern/deviations: Decreased step length - right, Decreased step length - left, Decreased stride length, Ataxic, Staggering left,  Scissoring General Gait Details: Min guard ambulating with RW for gait training, intermittent cues 55 feet in hallway, instruction for walker proximity, upright posture and symmetry of steps. Trialed ambulating without AD, pt requires min assist with intermittent LOB requiring physical assist to correct, ataxia more pronounced, wider BOS, ocassional scissoring and staggering towards left side. Gait velocity: decreased Gait velocity interpretation: <1.31 ft/sec, indicative of household ambulator    Posture / Balance Dynamic Sitting Balance Sitting balance - Comments: seated in chair Balance Overall balance assessment: Needs assistance Sitting-balance support: Bilateral upper extremity supported, Feet supported Sitting balance-Leahy Scale: Good Sitting balance - Comments: seated in chair Standing balance support: During functional activity, Bilateral upper extremity  supported Standing balance-Leahy Scale: Fair Standing balance comment: fair with RW Standardized Balance Assessment Standardized Balance Assessment : Berg Balance Test Berg Balance Test Sit to Stand: Able to stand using hands after several tries Standing Unsupported: Able to stand 2 minutes with supervision Sitting with Back Unsupported but Feet Supported on Floor or Stool: Able to sit safely and securely 2 minutes Stand to Sit: Controls descent by using hands Transfers: Needs one person to assist Standing Unsupported with Eyes Closed: Able to stand 10 seconds with supervision Standing Ubsupported with Feet Together: Able to place feet together independently and stand for 1 minute with supervision From Standing, Reach Forward with Outstretched Arm: Can reach forward >12 cm safely (5") From Standing Position, Pick up Object from Floor: Able to pick up shoe, needs supervision From Standing Position, Turn to Look Behind Over each Shoulder: Needs supervision when turning Turn 360 Degrees: Needs close supervision or verbal cueing Standing Unsupported, Alternately Place Feet on Step/Stool: Able to complete >2 steps/needs minimal assist Standing Unsupported, One Foot in Front: Able to take small step independently and hold 30 seconds Standing on One Leg: Tries to lift leg/unable to hold 3 seconds but remains standing independently Total Score: 31    Special needs/care consideration CIWA protocol Fall precautions   Previous Home Environment  Living Arrangements: Spouse/significant other  Lives With: Spouse Available Help at Discharge: Family, Available PRN/intermittently Type of Home: Apartment Home Layout: One level Home Access: Stairs to enter Entrance Stairs-Rails: Right, Left, Can reach both Entrance Stairs-Number of Steps: 12 Bathroom Shower/Tub: Multimedia programmer: Programmer, systems: Yes Home Care Services: No Additional Comments: Per PT note  Discharge  Living Setting Plans for Discharge Living Setting: Patient's home, Apartment, Lives with (comment) (wife) Type of Home at Discharge: Apartment Discharge Home Layout: One level Discharge Home Access: Stairs to enter Entrance Stairs-Rails: Right, Left, Can reach both Entrance Stairs-Number of Steps: 12 Discharge Bathroom Shower/Tub: Walk-in shower Discharge Bathroom Toilet: Standard Discharge Bathroom Accessibility: Yes How Accessible: Accessible via walker Does the patient have any problems obtaining your medications?: No  Social/Family/Support Systems Patient Roles: Spouse Contact Information: wife, [name] Anticipated Caregiver: wife Anticipated Ambulance person Information: see contacts Ability/Limitations of Caregiver: wife works night shift Caregiver Availability: Intermittent Discharge Plan Discussed with Primary Caregiver: Yes Is Caregiver In Agreement with Plan?: Yes Does Caregiver/Family have Issues with Lodging/Transportation while Pt is in Rehab?: No  Goals Patient/Family Goal for Rehab: Mod I with PT, OT and SLP Expected length of stay: ELOS 7 to 10 days Pt/Family Agrees to Admission and willing to participate: Yes Program Orientation Provided & Reviewed with Pt/Caregiver Including Roles  & Responsibilities: Yes  Decrease burden of Care through IP rehab admission: n/a  Possible need for SNF placement upon discharge: not antiicpated  Patient Condition: I have  reviewed medical records from [facility], spoken with CM, and patient and spouse. I discussed via phone for inpatient rehabilitation assessment.  Patient will benefit from ongoing PT, OT, and SLP, can actively participate in 3 hours of therapy a day 5 days of the week, and can make measurable gains during the admission.  Patient will also benefit from the coordinated team approach during an Inpatient Acute Rehabilitation admission.  The patient will receive intensive therapy as well as Rehabilitation  physician, nursing, social worker, and care management interventions.  Due to bladder management, bowel management, safety, skin/wound care, disease management, medication administration, pain management, and patient education the patient requires 24 hour a day rehabilitation nursing.  The patient is currently Min to minguard assist overall with mobility and basic ADLs.  Discharge setting and therapy post discharge at home with home health is anticipated.  Patient has agreed to participate in the Acute Inpatient Rehabilitation Program and will admit today.  Preadmission Screen Completed By:  Clois Dupes, 01/25/2022 10:04 AM ______________________________________________________________________   Discussed status with Dr. Shearon Stalls on 01/25/22 at 1004 and received approval for admission today.  Admission Coordinator:  Clois Dupes, RN, time 1004 Date 01/25/22   Assessment/Plan: Diagnosis: Does the need for close, 24 hr/day Medical supervision in concert with the patient's rehab needs make it unreasonable for this patient to be served in a less intensive setting? Yes Co-Morbidities requiring supervision/potential complications: CVA, HTN, bradycardia, CHF, dysphagia, alcohol abuse, constipation Due to safety, disease management, medication administration, and patient education, does the patient require 24 hr/day rehab nursing? Yes Does the patient require coordinated care of a physician, rehab nurse, PT, OT, and SLP to address physical and functional deficits in the context of the above medical diagnosis(es)? Yes Addressing deficits in the following areas: balance, endurance, locomotion, strength, transferring, bowel/bladder control, bathing, dressing, feeding, grooming, toileting, cognition, and swallowing Can the patient actively participate in an intensive therapy program of at least 3 hrs of therapy 5 days a week? Yes The potential for patient to make measurable gains while on  inpatient rehab is excellent Anticipated functional outcomes upon discharge from inpatient rehab: modified independent PT, modified independent OT, modified independent SLP Estimated rehab length of stay to reach the above functional goals is: 10-14 days Anticipated discharge destination: Home 10. Overall Rehab/Functional Prognosis: excellent   MD Signature:  Angelina Sheriff, DO 01/25/2022

## 2022-01-25 ENCOUNTER — Inpatient Hospital Stay (HOSPITAL_COMMUNITY)
Admission: RE | Admit: 2022-01-25 | Discharge: 2022-02-05 | DRG: 056 | Disposition: A | Discharge status: Home Health | Payer: No Typology Code available for payment source | Source: Other Acute Inpatient Hospital | Attending: Physical Medicine & Rehabilitation | Admitting: Physical Medicine & Rehabilitation

## 2022-01-25 ENCOUNTER — Other Ambulatory Visit: Payer: Self-pay

## 2022-01-25 ENCOUNTER — Encounter (HOSPITAL_COMMUNITY): Payer: Self-pay | Admitting: Physical Medicine & Rehabilitation

## 2022-01-25 DIAGNOSIS — R053 Chronic cough: Secondary | ICD-10-CM | POA: Diagnosis present

## 2022-01-25 DIAGNOSIS — Z7982 Long term (current) use of aspirin: Secondary | ICD-10-CM | POA: Diagnosis not present

## 2022-01-25 DIAGNOSIS — I69354 Hemiplegia and hemiparesis following cerebral infarction affecting left non-dominant side: Secondary | ICD-10-CM | POA: Diagnosis present

## 2022-01-25 DIAGNOSIS — Z7902 Long term (current) use of antithrombotics/antiplatelets: Secondary | ICD-10-CM

## 2022-01-25 DIAGNOSIS — I69328 Other speech and language deficits following cerebral infarction: Secondary | ICD-10-CM

## 2022-01-25 DIAGNOSIS — N400 Enlarged prostate without lower urinary tract symptoms: Secondary | ICD-10-CM | POA: Diagnosis present

## 2022-01-25 DIAGNOSIS — I69322 Dysarthria following cerebral infarction: Secondary | ICD-10-CM

## 2022-01-25 DIAGNOSIS — Z9109 Other allergy status, other than to drugs and biological substances: Secondary | ICD-10-CM

## 2022-01-25 DIAGNOSIS — E785 Hyperlipidemia, unspecified: Secondary | ICD-10-CM | POA: Diagnosis present

## 2022-01-25 DIAGNOSIS — I6350 Cerebral infarction due to unspecified occlusion or stenosis of unspecified cerebral artery: Secondary | ICD-10-CM | POA: Diagnosis not present

## 2022-01-25 DIAGNOSIS — Z8249 Family history of ischemic heart disease and other diseases of the circulatory system: Secondary | ICD-10-CM | POA: Diagnosis not present

## 2022-01-25 DIAGNOSIS — I110 Hypertensive heart disease with heart failure: Secondary | ICD-10-CM | POA: Diagnosis present

## 2022-01-25 DIAGNOSIS — Z91038 Other insect allergy status: Secondary | ICD-10-CM

## 2022-01-25 DIAGNOSIS — Z8261 Family history of arthritis: Secondary | ICD-10-CM

## 2022-01-25 DIAGNOSIS — Z91048 Other nonmedicinal substance allergy status: Secondary | ICD-10-CM

## 2022-01-25 DIAGNOSIS — Z91199 Patient's noncompliance with other medical treatment and regimen due to unspecified reason: Secondary | ICD-10-CM | POA: Diagnosis not present

## 2022-01-25 DIAGNOSIS — J3089 Other allergic rhinitis: Secondary | ICD-10-CM | POA: Diagnosis present

## 2022-01-25 DIAGNOSIS — F1010 Alcohol abuse, uncomplicated: Secondary | ICD-10-CM | POA: Diagnosis present

## 2022-01-25 DIAGNOSIS — L719 Rosacea, unspecified: Secondary | ICD-10-CM | POA: Diagnosis present

## 2022-01-25 DIAGNOSIS — F17210 Nicotine dependence, cigarettes, uncomplicated: Secondary | ICD-10-CM | POA: Diagnosis present

## 2022-01-25 DIAGNOSIS — I5043 Acute on chronic combined systolic (congestive) and diastolic (congestive) heart failure: Secondary | ICD-10-CM | POA: Diagnosis present

## 2022-01-25 DIAGNOSIS — G245 Blepharospasm: Secondary | ICD-10-CM | POA: Diagnosis present

## 2022-01-25 DIAGNOSIS — K5900 Constipation, unspecified: Secondary | ICD-10-CM | POA: Diagnosis present

## 2022-01-25 DIAGNOSIS — G4733 Obstructive sleep apnea (adult) (pediatric): Secondary | ICD-10-CM | POA: Diagnosis present

## 2022-01-25 DIAGNOSIS — Z79899 Other long term (current) drug therapy: Secondary | ICD-10-CM

## 2022-01-25 DIAGNOSIS — H409 Unspecified glaucoma: Secondary | ICD-10-CM | POA: Diagnosis present

## 2022-01-25 LAB — CREATININE, SERUM
Creatinine, Ser: 0.93 mg/dL (ref 0.61–1.24)
GFR, Estimated: >60 mL/min (ref >60)

## 2022-01-25 LAB — CBC
HCT: 45.8 % (ref 39.0–52.0)
Hemoglobin: 15.3 g/dL (ref 13.0–17.0)
MCH: 32.1 pg (ref 26.0–34.0)
MCHC: 33.4 g/dL (ref 30.0–36.0)
MCV: 96 fL (ref 80.0–100.0)
Platelets: 168 10*3/uL (ref 150–400)
RBC: 4.77 MIL/uL (ref 4.22–5.81)
RDW: 14 % (ref 11.5–15.5)
WBC: 4.3 10*3/uL (ref 4.0–10.5)
nRBC: 0 % (ref 0.0–0.2)

## 2022-01-25 MED ORDER — CARVEDILOL 12.5 MG PO TABS
12.5000 mg | ORAL_TABLET | Freq: Two times a day (BID) | ORAL | Status: DC
  Administered 2022-01-25 – 2022-02-05 (×22): 12.5 mg via ORAL
  Filled 2022-01-25 (×22): qty 1

## 2022-01-25 MED ORDER — HEPARIN SODIUM (PORCINE) 5000 UNIT/ML IJ SOLN: 5000.0000 [IU] | Freq: Three times a day (TID) | INTRAMUSCULAR | Status: DC

## 2022-01-25 MED ORDER — CLOPIDOGREL BISULFATE 75 MG PO TABS
75.0000 mg | ORAL_TABLET | Freq: Every day | ORAL | Status: DC
  Administered 2022-01-26 – 2022-02-05 (×11): 75 mg via ORAL
  Filled 2022-01-25 (×11): qty 1

## 2022-01-25 MED ORDER — ADULT MULTIVITAMIN W/MINERALS CH
1.0000 | ORAL_TABLET | Freq: Every day | ORAL | Status: DC
  Administered 2022-01-26 – 2022-02-05 (×11): 1 via ORAL
  Filled 2022-01-25 (×12): qty 1

## 2022-01-25 MED ORDER — PANTOPRAZOLE SODIUM 40 MG PO TBEC
40.0000 mg | DELAYED_RELEASE_TABLET | Freq: Every day | ORAL | Status: DC
  Administered 2022-01-26 – 2022-02-05 (×11): 40 mg via ORAL
  Filled 2022-01-25 (×11): qty 1

## 2022-01-25 MED ORDER — ASPIRIN 81 MG PO TBEC: 81.0000 mg | DELAYED_RELEASE_TABLET | Freq: Every day | ORAL | 2 refills | Status: DC

## 2022-01-25 MED ORDER — CARVEDILOL 6.25 MG PO TABS: 6.2500 mg | ORAL_TABLET | Freq: Two times a day (BID) | ORAL | 3 refills | Status: DC

## 2022-01-25 MED ORDER — HEPARIN SODIUM (PORCINE) 5000 UNIT/ML IJ SOLN
5000.0000 [IU] | Freq: Three times a day (TID) | INTRAMUSCULAR | Status: DC
  Administered 2022-01-25 – 2022-01-30 (×15): 5000 [IU] via SUBCUTANEOUS
  Filled 2022-01-25 (×15): qty 1

## 2022-01-25 MED ORDER — SENNOSIDES-DOCUSATE SODIUM 8.6-50 MG PO TABS: 1.0000 | ORAL_TABLET | Freq: Every evening | ORAL | Status: DC | PRN

## 2022-01-25 MED ORDER — THIAMINE MONONITRATE 100 MG PO TABS
100.0000 mg | ORAL_TABLET | Freq: Every day | ORAL | Status: DC
  Administered 2022-01-26 – 2022-02-05 (×11): 100 mg via ORAL
  Filled 2022-01-25 (×11): qty 1

## 2022-01-25 MED ORDER — ASPIRIN 81 MG PO TBEC
81.0000 mg | DELAYED_RELEASE_TABLET | Freq: Every day | ORAL | Status: DC
  Administered 2022-01-25 – 2022-02-04 (×11): 81 mg via ORAL
  Filled 2022-01-25 (×11): qty 1

## 2022-01-25 MED ORDER — CLOPIDOGREL BISULFATE 75 MG PO TABS: 75.0000 mg | ORAL_TABLET | Freq: Every day | ORAL | 0 refills | Status: DC

## 2022-01-25 MED ORDER — FOLIC ACID 1 MG PO TABS
1.0000 mg | ORAL_TABLET | Freq: Every day | ORAL | Status: DC
  Administered 2022-01-26 – 2022-02-05 (×11): 1 mg via ORAL
  Filled 2022-01-25 (×11): qty 1

## 2022-01-25 MED ORDER — THIAMINE HCL 100 MG/ML IJ SOLN
100.0000 mg | Freq: Every day | INTRAMUSCULAR | Status: DC
  Filled 2022-01-25 (×11): qty 1

## 2022-01-25 MED ORDER — POLYETHYLENE GLYCOL 3350 17 G PO PACK
17.0000 g | PACK | Freq: Every day | ORAL | Status: DC
  Administered 2022-01-25 – 2022-02-04 (×11): 17 g via ORAL
  Filled 2022-01-25 (×12): qty 1

## 2022-01-25 MED ORDER — NICOTINE 21 MG/24HR TD PT24
21.0000 mg | MEDICATED_PATCH | Freq: Every day | TRANSDERMAL | Status: DC
  Administered 2022-01-26 – 2022-02-05 (×11): 21 mg via TRANSDERMAL
  Filled 2022-01-25 (×11): qty 1

## 2022-01-25 MED ORDER — ATORVASTATIN CALCIUM 40 MG PO TABS
40.0000 mg | ORAL_TABLET | Freq: Every day | ORAL | Status: DC
  Administered 2022-01-26 – 2022-02-05 (×11): 40 mg via ORAL
  Filled 2022-01-25 (×11): qty 1

## 2022-01-25 MED ORDER — NICOTINE 21 MG/24HR TD PT24: 21.0000 mg | MEDICATED_PATCH | Freq: Every day | TRANSDERMAL | 0 refills | Status: DC

## 2022-01-25 MED ORDER — LISINOPRIL 20 MG PO TABS
20.0000 mg | ORAL_TABLET | Freq: Every day | ORAL | Status: DC
  Administered 2022-01-26 – 2022-02-05 (×11): 20 mg via ORAL
  Filled 2022-01-25 (×11): qty 1

## 2022-01-25 MED ORDER — OXYCODONE HCL 5 MG PO TABS: 5.0000 mg | ORAL_TABLET | Freq: Four times a day (QID) | ORAL | Status: DC | PRN

## 2022-01-25 MED ORDER — DULOXETINE HCL 30 MG PO CPEP
30.0000 mg | ORAL_CAPSULE | Freq: Every day | ORAL | Status: DC
  Administered 2022-01-26 – 2022-02-05 (×11): 30 mg via ORAL
  Filled 2022-01-25 (×11): qty 1

## 2022-01-25 MED ORDER — PAROXETINE HCL 10 MG PO TABS
5.0000 mg | ORAL_TABLET | Freq: Every day | ORAL | Status: DC
  Administered 2022-01-25 – 2022-02-04 (×11): 5 mg via ORAL
  Filled 2022-01-25 (×11): qty 0.5

## 2022-01-25 MED ORDER — ACETAMINOPHEN 650 MG RE SUPP: 650.0000 mg | RECTAL | Status: DC | PRN

## 2022-01-25 MED ORDER — ACETAMINOPHEN 325 MG PO TABS
650.0000 mg | ORAL_TABLET | ORAL | Status: DC | PRN
  Administered 2022-01-28 – 2022-02-04 (×13): 650 mg via ORAL
  Filled 2022-01-25 (×13): qty 2

## 2022-01-25 MED ORDER — ACETAMINOPHEN 160 MG/5ML PO SOLN: 650.0000 mg | ORAL | Status: DC | PRN

## 2022-01-25 MED ORDER — FAMOTIDINE 20 MG PO TABS
20.0000 mg | ORAL_TABLET | Freq: Every day | ORAL | Status: DC
  Administered 2022-01-25 – 2022-02-04 (×11): 20 mg via ORAL
  Filled 2022-01-25 (×11): qty 1

## 2022-01-25 NOTE — Progress Notes (Signed)
Report given to [place]

## 2022-01-25 NOTE — TOC Transition Note (Addendum)
Transition of Care [facility]) - CM/SW Discharge Note   Patient Details  Name: Randy [name] MRN: [id number] Date of Birth: 18-Oct-1950  [facility] CM/SW Contact:  Boneta Lucks, RN Phone Number: 01/25/2022, 10:05 AM   Clinical Narrative:   CIR received INS AUTH and set up [facility] for 11-11:30 transport. MD/RN updated. Wife is at the bedside.   Final next level of care: IP Rehab Facility Barriers to Discharge: Barriers Resolved   Patient Goals and CMS Choice Patient states their goals for this hospitalization and ongoing recovery are:: agreeabel to CIR CMS Medicare.gov Compare Post Acute Care list provided to:: Patient Represenative (must comment) Choice offered to / list presented to : Spouse  Discharge Placement                Patient to be transferred to facility by: [facility]   Patient and family notified of of transfer: 01/25/22  Discharge Plan and Services    CIR  Readmission Risk Interventions    01/25/2022   10:02 AM  Readmission Risk Prevention Plan  Post Dischage Appt Complete  Medication Screening Complete  Transportation Screening Complete

## 2022-01-25 NOTE — Discharge Instructions (Signed)
1)1)Please take Aspirin 81 mg daily along with Plavix 75 mg daily for 21 days then after that STOP the Plavix  and continue ONLY Aspirin 81 mg daily indefinitely--for secondary stroke Prevention (Per The multicenter SAMMPRIS trial) 2)Avoid ibuprofen/Advil/Aleve/Motrin/Goody Powders/Naproxen/BC powders/Meloxicam/Diclofenac/Indomethacin and other Nonsteroidal anti-inflammatory medications as these will make you more likely to bleed and can cause stomach ulcers, can also cause Kidney problems.  3) complete abstinence from tobacco advised 4) complete abstinence or at least moderation of alcohol use advised

## 2022-01-25 NOTE — Progress Notes (Signed)
Patient ID: Randy [name], male   DOB: [birth year], 71 y.o.   MRN: [id number] Admitted to unit, oriented to rehab process, team conference and plan of care. Reviewed therapy schedule and medications with safety plan. Margarito Liner

## 2022-01-25 NOTE — Plan of Care (Signed)
  Problem: Education: Goal: Knowledge of General Education information will improve Description: Including pain rating scale, medication(s)/side effects and non-pharmacologic comfort measures Outcome: Progressing   Problem: Health Behavior/Discharge Planning: Goal: Ability to manage health-related needs will improve Outcome: Progressing   Problem: Clinical Measurements: Goal: Ability to maintain clinical measurements within normal limits will improve Outcome: Progressing   Problem: Clinical Measurements: Goal: Cardiovascular complication will be avoided Outcome: Progressing   Problem: Activity: Goal: Risk for activity intolerance will decrease Outcome: Progressing   Problem: Coping: Goal: Level of anxiety will decrease Outcome: Progressing   Problem: Pain Managment: Goal: General experience of comfort will improve Outcome: Progressing   Problem: Education: Goal: Knowledge of General Education information will improve Description: Including pain rating scale, medication(s)/side effects and non-pharmacologic comfort measures Outcome: Progressing

## 2022-01-25 NOTE — Discharge Instructions (Addendum)
Inpatient Rehab Discharge Instructions  [facility] Discharge date and time: No discharge date for patient encounter.   Activities/Precautions/ Functional Status: Activity: activity as tolerated Diet: regular diet Wound Care: Routine skin checks Functional status:  ___ No restrictions     ___ Walk up steps independently ___ 24/7 supervision/assistance   ___ Walk up steps with assistance ___ Intermittent supervision/assistance  ___ Bathe/dress independently ___ Walk with walker     _x__ Bathe/dress with assistance ___ Walk Independently    ___ Shower independently ___ Walk with assistance    ___ Shower with assistance ___ No alcohol     ___ Return to work/school ________  COMMUNITY REFERRALS UPON DISCHARGE:    Home Health:   PT                   Agency: [facility] Phone: [phone]     Special Instructions:  No driving smoking or alcohol  Continue aspirin 81 mg daily and Plavix 75 mg daily until 02/10/2022 then Plavix alone    STROKE/TIA DISCHARGE INSTRUCTIONS SMOKING Cigarette smoking nearly doubles your risk of having a stroke & is the single most alterable risk factor  If you smoke or have smoked in the last 12 months, you are advised to quit smoking for your health. Most of the excess cardiovascular risk related to smoking disappears within a year of stopping. Ask you doctor about anti-smoking medications [place] Quit Line: 1-800-QUIT NOW Free Smoking Cessation Classes [phone]  CHOLESTEROL Know your levels; limit fat & cholesterol in your diet  Lipid Panel     Component Value Date/Time   CHOL 149 01/21/2022 0508   TRIG 131 01/21/2022 0508   HDL 33 (L) 01/21/2022 0508   CHOLHDL 4.5 01/21/2022 0508   VLDL 26 01/21/2022 0508   LDLCALC 90 01/21/2022 0508     Many patients benefit from treatment even if their cholesterol is at goal. Goal: Total Cholesterol (CHOL) less than 160 Goal:  Triglycerides (TRIG) less than 150 Goal:  HDL greater than 40 Goal:  LDL  (LDLCALC) less than 100   BLOOD PRESSURE American Stroke Association blood pressure target is less that 120/80 mm/Hg  Your discharge blood pressure is:  BP: 132/75 Monitor your blood pressure Limit your salt and alcohol intake Many individuals will require more than one medication for high blood pressure  DIABETES (A1c is a blood sugar average for last 3 months) Goal HGBA1c is under 7% (HBGA1c is blood sugar average for last 3 months)  Diabetes: No known diagnosis of diabetes    Lab Results  Component Value Date   HGBA1C 5.8 (H) 01/21/2022    Your HGBA1c can be lowered with medications, healthy diet, and exercise. Check your blood sugar as directed by your physician Call your physician if you experience unexplained or low blood sugars.  PHYSICAL ACTIVITY/REHABILITATION Goal is 30 minutes at least 4 days per week  Activity: Increase activity slowly, Therapies: Physical Therapy: Home Health Return to work:  Activity decreases your risk of heart attack and stroke and makes your heart stronger.  It helps control your weight and blood pressure; helps you relax and can improve your mood. Participate in a regular exercise program. Talk with your doctor about the best form of exercise for you (dancing, walking, swimming, cycling).  DIET/WEIGHT Goal is to maintain a healthy weight  Your discharge diet is:  Diet Order             Diet Heart Room service appropriate? Yes; Fluid consistency:  Thin  Diet effective now                   liquids Your height is:    Your current weight is:   Your Body Mass Index (BMI) is:    Following the type of diet specifically designed for you will help prevent another stroke. Your goal weight range is:   Your goal Body Mass Index (BMI) is 19-24. Healthy food habits can help reduce 3 risk factors for stroke:  High cholesterol, hypertension, and excess weight.  RESOURCES Stroke/Support Group:  Call [phone]   STROKE EDUCATION PROVIDED/REVIEWED AND  GIVEN TO PATIENT Stroke warning signs and symptoms How to activate emergency medical system (call 911). Medications prescribed at discharge. Need for follow-up after discharge. Personal risk factors for stroke. Pneumonia vaccine given: No Flu vaccine given: No My questions have been answered, the writing is legible, and I understand these instructions.  I will adhere to these goals & educational materials that have been provided to me after my discharge from the hospital.    y x3 weeks and aspirin alone  My questions have been answered and I understand these instructions. I will adhere to these goals and the provided educational materials after my discharge from the hospital.  Patient/Caregiver Signature _______________________________ Date __________  Clinician Signature _______________________________________ Date __________  Please bring this form and your medication list with you to all your follow-up doctor's appointments.

## 2022-01-25 NOTE — Progress Notes (Signed)
Inpatient Rehabilitation Admission Medication Review by a Pharmacist  A complete drug regimen review was completed for this patient to identify any potential clinically significant medication issues.  High Risk Drug Classes Is patient taking? Indication by Medication  Antipsychotic No   Anticoagulant Yes Heparin subQ-VTE prophylaxis  Antibiotic No   Opioid Yes Oxycodone prn- pain  Antiplatelet Yes Aspirin, Plavix x 3 wks, then Plavix alone- CVA prophylaxis  Hypoglycemics/insulin No   Vasoactive Medication Yes Coreg, Lisinopril- hypertension, heart failure  Chemotherapy No   Other Yes Pain- Tylenol prn Hyperlipidemia- Lipitor Duloxetine, Paxil- Mood Folic acid, MVI, thiamine-supplements Nicotine dependence- Nicoderm Protonix, pepcid- Reflux Senokot prn, Miralax- constipation    BPH- Hytrin held for permissive HTN  Type of Medication Issue Identified Description of Issue Recommendation(s)  Drug Interaction(s) (clinically significant)     Duplicate Therapy     Allergy     No Medication Administration End Date     Incorrect Dose     Additional Drug Therapy Needed     Significant med changes from prior encounter (inform family/care partners about these prior to discharge). Zyrtec, vitD, latanoprost, terazosin not restarted Toprol changed to Coreg Restart PTA meds as appropriate.   F/u which med should continue  Other       Clinically significant medication issues were identified that warrant physician communication and completion of prescribed/recommended actions by midnight of the next day:  No  Name of provider notified for urgent issues identified:   Provider Method of Notification: Secure Chat to Lauraine Rinne, [place]  Pharmacist comments: Linna Hoff aware of PTA meds not restarted and looking into the beta blocker  Time spent performing this drug regimen review (minutes):  30   Thank you for allowing [place] to participate in this patients care. Jens Som,  PharmD 01/25/2022 2:40 PM  **Pharmacist phone directory can be found on [email or web address] listed under [place]**

## 2022-01-25 NOTE — Progress Notes (Signed)
Attending, wife and [facility] at bedside pt educated on the importance on doing therapy and the high risk factors on stokes is the fact that he has had a stoke and all the risk factors are present.. pt agrees that second hand and smoking are factors that he will continue to improve .

## 2022-01-25 NOTE — Progress Notes (Addendum)
Inpatient Rehabilitation Admissions Coordinator   I have [facility] approval and CIR bed at [facility] in [place] to admit him to today. I will verify when bed is available and arrange [facility] transport. I have alerted acute team.  Danne Baxter, RN, MSN Rehab Admissions Coordinator [phone] 01/25/2022 8:20 AM  He will be admitted to room [place] and Dr Tressa Busman will be admitting MD for Rehab today. I will arrange for [facility] pickup at 1130.  Danne Baxter, RN, MSN Rehab Admissions Coordinator [phone] 01/25/2022 9:57 AM

## 2022-01-25 NOTE — Progress Notes (Signed)
Gertie Gowda, DO  Physician Physical Medicine and Rehabilitation PMR Pre-admission    Signed Date of Service:  01/24/2022  8:26 AM  Related encounter: ED to Hosp-Admission (Current) from 01/20/2022 in [place]      PMR Admission Coordinator Pre-Admission Assessment   Patient: Randy [name] is an 71 y.o., male MRN: [id number] DOB: Dec 27, 1950 Height: 5\' 10"  (177.8 cm) Weight: 95.4 kg   Insurance Information HMO:     PPO:      PCP:      IPA:      80/20:      OTHER:  PRIMARY: [place]      Policy#: [id number]      Subscriber: pt CM Name: Raquel Sarna      Phone#: [place]     Fax#: [phone] Pre-Cert#: [id number]      Employer:  Benefits:  Phone #: [phone]     Name: 10/195 Eff. Date: active 10/19     Deduct:       Out of Pocket Max:      CIR: per [facility] guidelines      SNF: per [facility] Outpatient: per [facility]     Co-Pay:  Home Health: per VA      Co-Pay:  DME: per [facility]     Co-Pay: Providers: in network  SECONDARY:          Financial Counselor:       Phone#:    The Engineer, petroleum" for patients in Inpatient Rehabilitation Facilities with attached "Privacy Act [place] Records" was provided and verbally reviewed with: N/A   Emergency Contact Information Contact Information       Name Relation Home Work Mobile    Barnhart,[name] Spouse     [id number]         Current Medical History  Patient Admitting Diagnosis: CVA   History of Present Illness:  71 year old right-handed male with history significant for BPH, glaucoma, systolic and diastolic heart failure, hyperlipidemia, hypertension, alcohol as well as tobacco use. [facility]ed to [facility]20/[facility] with acute onset of left-sided weakness/dizziness and slurred speech.  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  Moderate generalized atrophy.  MRI of the head and neck moderate to high-grade stenosis  proximal M3 segments of the right superior division.  No significant proximal stenosis aneurysm or branch vessel occlusion in the circle of Willis.  Patient did not receive tPA.  Admission chemistries unremarkable except glucose 149, hemoglobin A1c 5.8, alcohol negative, urine drug screen negative.  Carotid ultrasound with no stenosis.  Echocardiogram with ejection fraction of 40 to 45%.  Left ventricle demonstrating global hypokinesis as well as grade 1 diastolic dysfunction.  Neurology follow-up placed on aspirin 81 mg daily and Plavix 75 mg daily x3 weeks then Plavix alone.  Subcutaneous heparin for DVT prophylaxis.  Permissive hypertension and monitored.  Tolerating a regular diet.     Complete NIHSS TOTAL: 2   Patient's medical record from [facility] has been reviewed by the rehabilitation admission coordinator and physician.   Past Medical History      Past Medical History:  Diagnosis Date   Angio-edema     Blepharospasm     BPH (benign prostatic hyperplasia)     Cervical myelopathy ([place])     Glaucoma 2021   Hyperlipidemia     Nephrolithiasis     Optic neuropathy      Right   Urticaria  Has the patient had major surgery during 100 days prior to admission? No   Family History   family history includes Arthritis in his sister; Breast cancer in his sister; Dementia in his sister; Healthy in his daughter and son; Heart attack in his father.   Current Medications   Current Facility-Administered Medications:    acetaminophen (TYLENOL) tablet 650 mg, 650 mg, Oral, Q4H PRN **OR** acetaminophen (TYLENOL) 160 MG/5ML solution 650 mg, 650 mg, Per Tube, Q4H PRN **OR** acetaminophen (TYLENOL) suppository 650 mg, 650 mg, Rectal, Q4H PRN, Zierle-Ghosh, Asia B, DO   aspirin EC tablet 81 mg, 81 mg, Oral, QHS, Yadav, Cecil Cranker, MD, 81 mg at 01/24/22 2130   atorvastatin (LIPITOR) tablet 40 mg, 40 mg, Oral, Daily, Zierle-Ghosh, Asia B, DO, 40 mg at 01/24/22 1059   carvedilol (COREG)  tablet 12.5 mg, 12.5 mg, Oral, BID WC, Barton Dubois, MD, 12.5 mg at 01/25/22 9233   clopidogrel (PLAVIX) tablet 75 mg, 75 mg, Oral, Daily, Lora Havens, MD, 75 mg at 01/24/22 1059   DULoxetine (CYMBALTA) DR capsule 30 mg, 30 mg, Oral, Daily, Zierle-Ghosh, Asia B, DO, 30 mg at 01/24/22 1059   famotidine (PEPCID) tablet 20 mg, 20 mg, Oral, QHS, Zierle-Ghosh, Asia B, DO, 20 mg at 00/76/22 6333   folic acid (FOLVITE) tablet 1 mg, 1 mg, Oral, Daily, Zierle-Ghosh, Asia B, DO, 1 mg at 01/24/22 1059   heparin injection 5,000 Units, 5,000 Units, Subcutaneous, Q8H, Zierle-Ghosh, Asia B, DO, 5,000 Units at 01/25/22 0600   lisinopril (ZESTRIL) tablet 20 mg, 20 mg, Oral, Daily, Barton Dubois, MD, 20 mg at 01/24/22 1059   multivitamin with minerals tablet 1 tablet, 1 tablet, Oral, Daily, Zierle-Ghosh, Asia B, DO, 1 tablet at 01/24/22 1059   nicotine (NICODERM CQ - dosed in mg/24 hours) patch 21 mg, 21 mg, Transdermal, Daily, Zierle-Ghosh, Asia B, DO, 21 mg at 01/24/22 1059   oxyCODONE (Oxy IR/ROXICODONE) immediate release tablet 5 mg, 5 mg, Oral, Q6H PRN, Zierle-Ghosh, Asia B, DO   pantoprazole (PROTONIX) EC tablet 40 mg, 40 mg, Oral, Daily, Zierle-Ghosh, Asia B, DO, 40 mg at 01/25/22 0600   PARoxetine (PAXIL) tablet 5 mg, 5 mg, Oral, QHS, Zierle-Ghosh, Asia B, DO, 5 mg at 01/24/22 2209   senna-docusate (Senokot-S) tablet 1 tablet, 1 tablet, Oral, QHS PRN, Zierle-Ghosh, Asia B, DO   thiamine (VITAMIN B1) tablet 100 mg, 100 mg, Oral, Daily, 100 mg at 01/24/22 1059 **OR** thiamine (VITAMIN B1) injection 100 mg, 100 mg, Intravenous, Daily, Zierle-Ghosh, Asia B, DO   Patients Current Diet:  Diet Order                  Diet Heart Room service appropriate? Yes; Fluid consistency: Thin  Diet effective now                       Precautions / Restrictions Precautions Precautions: Fall Restrictions Weight Bearing Restrictions: No    Has the patient had 2 or more falls or a fall with injury in the  past year? No   Prior Activity Level Community (5-7x/wk): independent without AD; drives   Prior Functional Level Self Care: Did the patient need help bathing, dressing, using the toilet or eating? Independent   Indoor Mobility: Did the patient need assistance with walking from room to room (with or without device)? Independent   Stairs: Did the patient need assistance with internal or external stairs (with or without device)? Independent   Functional Cognition: Did  the patient need help planning regular tasks such as shopping or remembering to take medications? Independent   Patient Information Are you of Hispanic, Latino/a,or Spanish origin?: A. No, not of Hispanic, Latino/a, or Spanish origin What is your race?: B. Black or African American Do you need or want an interpreter to communicate with a doctor or health care staff?: 0. No   Patient's Response To:  Health Literacy and Transportation Is the patient able to respond to health literacy and transportation needs?: Yes Health Literacy - How often do you need to have someone help you when you read instructions, pamphlets, or other written material from your doctor or pharmacy?: Never In the past 12 months, has lack of transportation kept you from medical appointments or from getting medications?: No In the past 12 months, has lack of transportation kept you from meetings, work, or from getting things needed for daily living?: No   Home Assistive Devices / [place] Devices/Equipment: Radio producer (specify quad or straight), Eyeglasses Home Equipment: Cane - single point, BSC/3in1   Prior Device Use: Indicate devices/aids used by the patient prior to current illness, exacerbation or injury? None of the above   Current Functional Level Cognition   Arousal/Alertness: Awake/alert Overall Cognitive Status: Within Functional Limits for tasks assessed Orientation Level: Oriented X4, Oriented to person, Oriented to situation,  Oriented to place, Oriented to time Following Commands: Follows multi-step commands inconsistently, Follows one step commands inconsistently General Comments: Occasional repetition required for instructions with multi-step tasks. Attention: Sustained Sustained Attention: Impaired Sustained Attention Impairment: Verbal complex Memory: Impaired Memory Impairment: Storage deficit Awareness: Impaired Awareness Impairment: Emergent impairment Problem Solving: Impaired Problem Solving Impairment: Verbal complex Executive Function: Self Monitoring Behaviors: Impulsive Safety/Judgment:  (to be determined)    Extremity Assessment (includes Sensation/Coordination)   Upper Extremity Assessment: Defer to OT evaluation LUE Deficits / Details: Pt limited to near 50% A/ROM of shoulder at baseline due to rotator cuff tear that is ~71 years old. Today pt limited to 50% A/ROM of shoulder abduction; 4+/5 elbow flexion; 4/5 elbow extension; 4+/5 wrist flexion; 5/5 wrist extension; 3+/5 grip; Composit extension limited 2+/5. LUE Sensation: WNL LUE Coordination: decreased fine motor  Lower Extremity Assessment: Generalized weakness LLE Deficits / Details: patient slightly weaker on L LE w/ hip flexion (MMT 3+/5) LLE Sensation: WNL LLE Coordination: WNL     ADLs   Overall ADL's : Needs assistance/impaired Eating/Feeding: Set up, Sitting Grooming: Supervision/safety, Standing, Oral care Grooming Details (indicate cue type and reason): Pt able to stand at sink with RW available to brush teeth, lotion his face, and shave his head and face. Pt stood for ~13 minutes straight. Upper Body Bathing: Min guard, Minimal assistance, Sitting Lower Body Bathing: Minimal assistance, Moderate assistance Upper Body Dressing : Min guard, Minimal assistance Lower Body Dressing: Minimal assistance, Sitting/lateral leans Lower Body Dressing Details (indicate cue type and reason): Pt needing assist to don L sock but able to  doff both and don R sock without assist. Toilet Transfer: Min guard, Minimal assistance, Ambulation, Rolling walker (2 wheels) Toilet Transfer Details (indicate cue type and reason): Chair to toilet and back with RW. Toileting- Water quality scientist and Hygiene: Min guard, Sitting/lateral lean, Minimal assistance Tub/ Shower Transfer: Min guard, Minimal assistance, Ambulation Functional mobility during ADLs: Supervision/safety, Rolling walker (2 wheels) General ADL Comments: Pt able to ambulate to sink and back to chair with supervision using RW.     Mobility   Overal bed mobility: Independent General bed mobility comments:  labored movement     Transfers   Overall transfer level: Needs assistance Equipment used: Rolling walker (2 wheels) Transfers: Sit to/from Stand Sit to Stand: Supervision Bed to/from chair/wheelchair/BSC transfer type:: Step pivot Step pivot transfers: Supervision General transfer comment: chair to standing at sink and back to chair with RW     Ambulation / Gait / Stairs / Wheelchair Mobility   Ambulation/Gait Ambulation/Gait assistance: Min assist, Min guard Gait Distance (Feet): 85 Feet Assistive device: Rolling walker (2 wheels), 1 person hand held assist Gait Pattern/deviations: Decreased step length - right, Decreased step length - left, Decreased stride length, Ataxic, Staggering left, Scissoring General Gait Details: Min guard ambulating with RW for gait training, intermittent cues 55 feet in hallway, instruction for walker proximity, upright posture and symmetry of steps. Trialed ambulating without AD, pt requires min assist with intermittent LOB requiring physical assist to correct, ataxia more pronounced, wider BOS, ocassional scissoring and staggering towards left side. Gait velocity: decreased Gait velocity interpretation: <1.31 ft/sec, indicative of household ambulator     Posture / Balance Dynamic Sitting Balance Sitting balance - Comments: seated in  chair Balance Overall balance assessment: Needs assistance Sitting-balance support: Bilateral upper extremity supported, Feet supported Sitting balance-Leahy Scale: Good Sitting balance - Comments: seated in chair Standing balance support: During functional activity, Bilateral upper extremity supported Standing balance-Leahy Scale: Fair Standing balance comment: fair with RW Standardized Balance Assessment Standardized Balance Assessment : Berg Balance Test Berg Balance Test Sit to Stand: Able to stand using hands after several tries Standing Unsupported: Able to stand 2 minutes with supervision Sitting with Back Unsupported but Feet Supported on Floor or Stool: Able to sit safely and securely 2 minutes Stand to Sit: Controls descent by using hands Transfers: Needs one person to assist Standing Unsupported with Eyes Closed: Able to stand 10 seconds with supervision Standing Ubsupported with Feet Together: Able to place feet together independently and stand for 1 minute with supervision From Standing, Reach Forward with Outstretched Arm: Can reach forward >12 cm safely (5") From Standing Position, Pick up Object from Floor: Able to pick up shoe, needs supervision From Standing Position, Turn to Look Behind Over each Shoulder: Needs supervision when turning Turn 360 Degrees: Needs close supervision or verbal cueing Standing Unsupported, Alternately Place Feet on Step/Stool: Able to complete >2 steps/needs minimal assist Standing Unsupported, One Foot in Front: Able to take small step independently and hold 30 seconds Standing on One Leg: Tries to lift leg/unable to hold 3 seconds but remains standing independently Total Score: 31     Special needs/care consideration CIWA protocol Fall precautions    Previous Home Environment  Living Arrangements: Spouse/significant other  Lives With: Spouse Available Help at Discharge: Family, Available PRN/intermittently Type of Home:  Apartment Home Layout: One level Home Access: Stairs to enter Entrance Stairs-Rails: Right, Left, Can reach both Entrance Stairs-Number of Steps: 12 Bathroom Shower/Tub: Health visitor: Pharmacist, community: Yes Home Care Services: No Additional Comments: Per PT note   Discharge Living Setting Plans for Discharge Living Setting: Patient's home, Apartment, Lives with (comment) (wife) Type of Home at Discharge: Apartment Discharge Home Layout: One level Discharge Home Access: Stairs to enter Entrance Stairs-Rails: Right, Left, Can reach both Entrance Stairs-Number of Steps: 12 Discharge Bathroom Shower/Tub: Walk-in shower Discharge Bathroom Toilet: Standard Discharge Bathroom Accessibility: Yes How Accessible: Accessible via walker Does the patient have any problems obtaining your medications?: No   Social/Family/Support Systems Patient Roles: Spouse Contact Information: wife, Sunny Schlein Anticipated  Caregiver: wife Anticipated Ambulance person Information: see contacts Ability/Limitations of Caregiver: wife works night shift Caregiver Availability: Intermittent Discharge Plan Discussed with Primary Caregiver: Yes Is Caregiver In Agreement with Plan?: Yes Does Caregiver/Family have Issues with Lodging/Transportation while Pt is in Rehab?: No   Goals Patient/Family Goal for Rehab: Mod I with PT, OT and SLP Expected length of stay: ELOS 7 to 10 days Pt/Family Agrees to Admission and willing to participate: Yes Program Orientation Provided & Reviewed with Pt/Caregiver Including Roles  & Responsibilities: Yes   Decrease burden of Care through IP rehab admission: n/a   Possible need for SNF placement upon discharge: not antiicpated   Patient Condition: I have reviewed [facility] records [facility], spoken with CM, and patient and spouse. I discussed via phone for inpatient rehabilitation assessment.  Patient will benefit from ongoing PT, OT,  and SLP, can actively participate in 3 hours of therapy a day 5 days of the week, and can make measurable gains during the admission.  Patient will also benefit from the coordinated team approach during an Inpatient Acute Rehabilitation admission.  The patient will receive intensive therapy as well as Rehabilitation physician, nursing, social worker, and care management interventions.  Due to bladder management, bowel management, safety, skin/wound care, disease management, medication administration, pain management, and patient education the patient requires 24 hour a day rehabilitation nursing.  The patient is currently Min to minguard assist overall with mobility and basic ADLs.  Discharge setting and therapy post discharge at home with home health is anticipated.  Patient has agreed to participate in the Acute Inpatient Rehabilitation Program and will admit today.   Preadmission Screen Completed By:  Cleatrice Burke, 01/25/2022 10:04 AM ______________________________________________________________________   Discussed status with Dr. Tressa Busman on 01/25/22 at 1004 and received approval for admission today.   Admission Coordinator:  Cleatrice Burke, RN, time 1004 Date 01/25/22    Assessment/Plan: Diagnosis: Does the need for close, 24 hr/day Medical supervision in concert with the patient's rehab needs make it unreasonable for this patient to be served in a less intensive setting? Yes Co-Morbidities requiring supervision/potential complications: CVA, HTN, bradycardia, CHF, dysphagia, alcohol abuse, constipation Due to safety, disease management, medication administration, and patient education, does the patient require 24 hr/day rehab nursing? Yes Does the patient require coordinated care of a physician, rehab nurse, PT, OT, and SLP to address physical and functional deficits in the context of the above medical diagnosis(es)? Yes Addressing deficits in the following areas: balance,  endurance, locomotion, strength, transferring, bowel/bladder control, bathing, dressing, feeding, grooming, toileting, cognition, and swallowing Can the patient actively participate in an intensive therapy program of at least 3 hrs of therapy 5 days a week? Yes The potential for patient to make measurable gains while on inpatient rehab is excellent Anticipated functional outcomes upon discharge from inpatient rehab: modified independent PT, modified independent OT, modified independent SLP Estimated rehab length of stay to reach the above functional goals is: 10-14 days Anticipated discharge destination: Home 10. Overall Rehab/Functional Prognosis: excellent     MD Signature:   Gertie Gowda, DO 01/25/2022          Revision History                                     Note Details  Author Gertie Gowda, DO File Time 01/25/2022 10:30 AM  Author Type Physician Status Signed  Last Editor Gertie Gowda, DO Service Physical Medicine and Rehabilitation

## 2022-01-25 NOTE — Progress Notes (Signed)
Pt requested the RSV while in patient.  Attending and pharmacy notified. Message from S. Hurth RPH don't have RSV vaccines here. he will probably have to get it as an outpatient Informed pt.

## 2022-01-25 NOTE — Discharge Summary (Signed)
Randy [name], is a 71 y.o. male  DOB 1951-02-06  MRN [id number].  Admission date:  01/20/2022  Admitting Physician  Lilyan Gilford, DO  Discharge Date:  01/25/2022   Primary MD  Rema Jasmine, NP  Recommendations for primary care physician for things to follow:   1)Please take Aspirin 81 mg daily along with Plavix 75 mg daily for 21 days then after that STOP the Plavix  and continue ONLY Aspirin 81 mg daily indefinitely--for secondary stroke Prevention (Per The multicenter SAMMPRIS trial) 2)Avoid ibuprofen/Advil/Aleve/Motrin/Goody Powders/Naproxen/BC powders/Meloxicam/Diclofenac/Indomethacin and other Nonsteroidal anti-inflammatory medications as these will make you more likely to bleed and can cause stomach ulcers, can also cause Kidney problems.  3) complete abstinence from tobacco advised 4) complete abstinence or at least moderation of alcohol use advised  Admission Diagnosis  CVA (cerebral vascular accident) (HCC) [I63.9] Acute ischemic stroke [facility]) [I63.9]   Discharge Diagnosis  CVA (cerebral vascular accident) (HCC) [I63.9] Acute ischemic stroke (HCC) [I63.9]    Principal Problem:   CVA (cerebral vascular accident) (HCC) Active Problems:   HTN (hypertension)   Tobacco use   ETOH abuse      Past Medical History:  Diagnosis Date   Angio-edema    Blepharospasm    BPH (benign prostatic hyperplasia)    Cervical myelopathy (HCC)    Glaucoma 2021   Hyperlipidemia    Nephrolithiasis    Optic neuropathy    Right   Urticaria     Past Surgical History:  Procedure Laterality Date   NECK SURGERY  2015   TONSILLECTOMY       HPI  from the history and physical done on the day of admission:   HPI: Randy [name] is a 71 y.o. male with medical history significant of BPH, hyperlipidemia, hypertension, alcohol abuse, smoking use disorder, and more presents the ED with a chief complaint  of weakness.  Patient reports that between 6 PM and 8 PM last night he started having left-sided weakness and slurred speech.  Patient reports he was drinking at the time.  He drank about a liter of wine.  He reports he had no headache.  He felt off balance, and ultimately at 3 AM he fell backwards striking the back of his head.  He was diaphoretic at that time.  He reports he was so weak he could not get up and his wife had to help him.  He did not lose consciousness.  Patient reports that he fell because he felt off balance.  He just put mouthwash in his mouth to gargle, and when throwing his head back his whole body went back.  Patient reports he has had no change in vision.  No change in hearing.  At first he says no numbness or paresthesias, but then he says his left shoulder has some paresthesias.  He reports his speech is slurred and slow.  His speech is still not returned to baseline per his report.  He denies any chest pain or palpitations.  Patient reports that he knew his  gait was off, but did not feel like it was an asymmetric weakness issue.  He just felt weak everywhere per his report.  Patient reports he is right-hand dominant.  He had no difficulty swallowing.  He has no other complaints at this time.  Patient reports that up until this point he was in his normal state of health.   Patient does smoke 1 pack of cigarettes per day.  He is thought about quitting and has nicotine patches at home but has not started the quitting process yet.  Advised on the importance of cessation.  Patient drinks alcohol.  He is not able to quantify how much he drinks, but that he does drink most nights of the week.  Last night he drank a liter of wine, sometimes he drinks 1/5 of liquor.  Patient reports he uses no illicit drugs.  He is vaccinated for COVID.  Patient is full code. Review of Systems: As mentioned in the history of present illness. All other systems reviewed and are negative.    Hospital Course:     Assessment and Plan: 1)Acute CVA (cerebral vascular accident) ([facility]) - CT head showed no acute intracranial abnormality - MRI brain demonstrating acute nonhemorrhagic right paramedian pontine infarct measuring 9 mm. -A1c 5.8 -LDL 90, HDL 33 - Patient loaded with aspirin and Plavix -Neurology consult appreciated -Continue atorvastatin 40 mg daily -speech improving, left-sided weakness and numbness continues to improve , no additional new focal deficits, no tremors --Continue risk factor modifications (stop smoking, stop alcohol consumption and control blood pressure). - PT/OT recommended  CIR-- - take Aspirin 81 mg daily along with Plavix 75 mg daily for 21 days then after that STOP the Plavix  and continue ONLY Aspirin 81 mg daily indefinitely--for secondary stroke Prevention (Per The multicenter SAMMPRIS trial) -Transfer to CIR inpatient rehab    2)ETOH abuse - Patient reports that he drinks most nights - Cessation counseling provided -we used CIWA protocol  -Patient received thiamine and folic acid -No withdrawal symptoms currently appreciated.   3)Tobacco use - Continue nicotine patch - Counseled on importance of cessation especially in the setting of acute stroke   4)HTN (hypertension) -Initially permissive hypertension was allowed -Carvedilol, lisinopril has been restarted  -Heart healthy/low-sodium diet discussed with patient.   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  Global hypokinesis and no significant valvular disorder. -Finding most likely associated with known ischemic cardiomyopathy most likely EtOH  abuse contributed to this ---lisinopril and Coreg as above #4 -Outpatient follow-up cardiologist advised  Discharge Condition: stable   Follow UP--outpatient follow-up with cardiology and neurology    Consults obtained -neurology  Diet and Activity  recommendation:  As advised  Discharge Instructions    Discharge Instructions     Call MD for:  difficulty breathing, headache or visual disturbances   Complete by: As directed    Call MD for:  persistant dizziness or light-headedness   Complete by: As directed    Call MD for:  persistant nausea and vomiting   Complete by: As directed    Call MD for:  temperature >100.4   Complete by: As directed    Diet - low sodium heart healthy   Complete by: As directed    Discharge instructions   Complete by: As directed    1)1)Please take Aspirin 81 mg daily along with Plavix 75 mg daily for 21 days then after that STOP the Plavix  and  continue ONLY Aspirin 81 mg daily indefinitely--for secondary stroke Prevention (Per The multicenter SAMMPRIS trial) 2)Avoid ibuprofen/Advil/Aleve/Motrin/Goody Powders/Naproxen/BC powders/Meloxicam/Diclofenac/Indomethacin and other Nonsteroidal anti-inflammatory medications as these will make you more likely to bleed and can cause stomach ulcers, can also cause Kidney problems.  3) complete abstinence from tobacco advised 4) complete abstinence or at least moderation of alcohol use advised   Increase activity slowly   Complete by: As directed         Discharge Medications     Allergies as of 01/25/2022       Reactions   Hornet Venom Anaphylaxis   Wasp Venom Anaphylaxis   Yellow Jacket Venom [bee Venom] Anaphylaxis   Other    Dogs and Cats per allergy clinic         Medication List     STOP taking these medications    famotidine 20 MG tablet Commonly known as: PEPCID   metoprolol succinate 25 MG 24 hr tablet Commonly known as: TOPROL-XL   naproxen sodium 550 MG tablet Commonly known as: ANAPROX   polyethylene glycol-electrolytes 420 g solution Commonly known as: TriLyte       TAKE these medications    aspirin EC 81 MG tablet Take 1 tablet (81 mg total) by mouth daily with breakfast. What changed: when to take this   atorvastatin  80 MG tablet Commonly known as: LIPITOR Take 40 mg by mouth daily.   carvedilol 6.25 MG tablet Commonly known as: COREG Take 1 tablet (6.25 mg total) by mouth 2 (two) times daily with a meal.   Centrum tablet Take 1 tablet by mouth daily.   cetirizine 10 MG tablet Commonly known as: ZYRTEC Take 2 tablets (20 mg total) by mouth 2 (two) times daily. What changed:  how much to take when to take this   clopidogrel 75 MG tablet Commonly known as: PLAVIX Take 1 tablet (75 mg total) by mouth daily. For 21 days only Start taking on: January 26, 2022   diphenhydrAMINE 25 MG tablet Commonly known as: BENADRYL Take 1 tablet (25 mg total) by mouth every 6 (six) hours. What changed:  when to take this reasons to take this   DULoxetine 30 MG capsule Commonly known as: CYMBALTA Take 30 mg by mouth daily.   EPINEPHrine 0.3 mg/0.3 mL Soaj injection Commonly known as: EPI-PEN Inject 0.3 mg into the muscle as needed for anaphylaxis.   latanoprost 0.005 % ophthalmic solution Commonly known as: XALATAN Place 1 drop into both eyes at bedtime.   lisinopril 40 MG tablet Commonly known as: ZESTRIL Take 20 mg by mouth daily.   nicotine 21 mg/24hr patch Commonly known as: NICODERM CQ - dosed in mg/24 hours Place 1 patch (21 mg total) onto the skin daily. Start taking on: January 26, 2022   omeprazole 20 MG capsule Commonly known as: PRILOSEC Take 20 mg by mouth daily.   PARoxetine 10 MG tablet Commonly known as: PAXIL Take 5 mg by mouth at bedtime.   terazosin 2 MG capsule Commonly known as: HYTRIN Take 4 mg by mouth at bedtime.   Vitamin D 50 MCG (2000 UT) tablet Take 2,000 Units by mouth daily.       Major procedures and Radiology Reports - PLEASE review detailed and final reports for all details, in brief -   ECHOCARDIOGRAM COMPLETE  Result Date: 01/21/2022    ECHOCARDIOGRAM REPORT   Patient Name:   Arlester MarkerJOHN Villanueva Date of Exam: 01/21/2022 Medical Rec #:  [id number]  Height:       70.0 in Accession #:    [id number]      Weight:       214.9 lb Date of Birth:  Apr 22, 1950       BSA:          2.152 m Patient Age:    71 years        BP:           136/86 mmHg Patient Gender: M               HR:           61 bpm. [facility] Procedure: 2D Echo, Cardiac Doppler and Color Doppler Indications:    Stroke I63.9  History:        Patient has no prior history of Echocardiogram examinations.                 Risk Factors:Hypertension and Dyslipidemia. ETOH abuse, Tobacco                 use.  Sonographer:    Celesta Gentile RCS Referring Phys: [id number] ASIA B ZIERLE-GHOSH IMPRESSIONS  1. Left ventricular ejection fraction, by estimation, is 40 to 45%. The left ventricle has mildly decreased function. The left ventricle demonstrates global hypokinesis. Left ventricular diastolic parameters are consistent with Grade I diastolic dysfunction (impaired relaxation).  2. Right ventricular systolic function is mildly reduced. The right ventricular size is normal. Tricuspid regurgitation signal is inadequate for assessing PA pressure.  3. The mitral valve is grossly normal. Trivial mitral valve regurgitation. No evidence of mitral stenosis.  4. The aortic valve is tricuspid. There is moderate calcification of the aortic valve. There is mild thickening of the aortic valve. Aortic valve regurgitation is not visualized. Aortic valve sclerosis/calcification is present, without any evidence of aortic stenosis.  5. The inferior vena cava is normal in size with greater than 50% respiratory variability, suggesting right atrial pressure of 3 mmHg. FINDINGS  Left Ventricle: Left ventricular ejection fraction, by estimation, is 40 to 45%. The left ventricle has mildly decreased function. The left ventricle demonstrates global hypokinesis. The left ventricular internal cavity size was normal in size. There is  no left ventricular hypertrophy. Left ventricular diastolic parameters are consistent with  Grade I diastolic dysfunction (impaired relaxation). Right Ventricle: The right ventricular size is normal. No increase in right ventricular wall thickness. Right ventricular systolic function is mildly reduced. Tricuspid regurgitation signal is inadequate for assessing PA pressure. Left Atrium: Left atrial size was normal in size. Right Atrium: Right atrial size was normal in size. Pericardium: There is no evidence of pericardial effusion. Mitral Valve: The mitral valve is grossly normal. Trivial mitral valve regurgitation. No evidence of mitral valve stenosis. Tricuspid Valve: The tricuspid valve is grossly normal. Tricuspid valve regurgitation is not demonstrated. No evidence of tricuspid stenosis. Aortic Valve: The aortic valve is tricuspid. There is moderate calcification of the aortic valve. There is mild thickening of the aortic valve. Aortic valve regurgitation is not visualized. Aortic valve sclerosis/calcification is present, without any evidence of aortic stenosis. Pulmonic Valve: The pulmonic valve was grossly normal. Pulmonic valve regurgitation is not visualized. No evidence of pulmonic stenosis. Aorta: The aortic root and ascending aorta are structurally normal, with no evidence of dilitation. Venous: The inferior vena cava is normal in size with greater than 50% respiratory variability, suggesting right atrial pressure of 3 mmHg. IAS/Shunts: The atrial septum is grossly normal.  LEFT VENTRICLE  PLAX 2D LVIDd:         4.30 cm   Diastology LVIDs:         2.70 cm   LV e' medial:    5.55 cm/s LV PW:         1.00 cm   LV E/e' medial:  10.0 LV IVS:        1.00 cm   LV e' lateral:   6.64 cm/s LVOT diam:     2.10 cm   LV E/e' lateral: 8.4 LV SV:         59 LV SV Index:   28 LVOT Area:     3.46 cm  RIGHT VENTRICLE RV S prime:     8.49 cm/s TAPSE (M-mode): 1.9 cm LEFT ATRIUM             Index        RIGHT ATRIUM           Index LA diam:        3.00 cm 1.39 cm/m   RA Area:     12.80 cm LA Vol (A2C):   28.7 ml  13.33 ml/m  RA Volume:   30.50 ml  14.17 ml/m LA Vol (A4C):   45.0 ml 20.91 ml/m LA Biplane Vol: 39.3 ml 18.26 ml/m  AORTIC VALVE LVOT Vmax:   78.30 cm/s LVOT Vmean:  50.800 cm/s LVOT VTI:    0.171 m  AORTA Ao Root diam: 3.40 cm MITRAL VALVE MV Area (PHT): 3.42 cm    SHUNTS MV Decel Time: 222 msec    Systemic VTI:  0.17 m MV E velocity: 55.50 cm/s  Systemic Diam: 2.10 cm MV A velocity: 62.40 cm/s MV E/A ratio:  0.89 Lennie Odor MD Electronically signed by Lennie Odor MD Signature Date/Time: 01/21/2022/10:38:26 AM    Final    US Carotid Bilateral  Result Date: 01/21/2022 CLINICAL DATA:  Slurred speech Left-sided weakness Hyperlipidemia Current tobacco user EXAM: BILATERAL CAROTID DUPLEX ULTRASOUND TECHNIQUE: Wallace Cullens scale imaging, color Doppler and duplex ultrasound were performed of bilateral carotid and vertebral arteries in the neck. COMPARISON:  None available FINDINGS: Criteria: Quantification of carotid stenosis is based on velocity parameters that correlate the residual internal carotid diameter with NASCET-based stenosis levels, using the diameter of the distal internal carotid lumen as the denominator for stenosis measurement. The following velocity measurements were obtained: RIGHT ICA: 74/20 cm/sec CCA: 105/19 cm/sec SYSTOLIC ICA/CCA RATIO:  0.7 ECA: 121 cm/sec LEFT ICA: 87/15 cm/sec CCA: 90/60 cm/sec SYSTOLIC ICA/CCA RATIO:  1.0 ECA: 112 cm/sec RIGHT CAROTID ARTERY: Minimal calcified plaque of the ICA origin. RIGHT VERTEBRAL ARTERY:  Antegrade flow. LEFT CAROTID ARTERY: Minimal heterogeneous plaque in the carotid bifurcation. LEFT VERTEBRAL ARTERY:  Antegrade flow. IMPRESSION: No significant stenosis of internal carotid arteries. Electronically Signed   By: Acquanetta Belling M.D.   On: 01/21/2022 09:08   MR BRAIN WO CONTRAST  Result Date: 01/21/2022 CLINICAL DATA:  No deficit, acute, stroke suspected. Left-sided weakness. Slurred speech. EXAM: MRI HEAD WITHOUT CONTRAST TECHNIQUE: Multiplanar,  multiecho pulse sequences of the brain and surrounding structures were obtained without intravenous contrast. COMPARISON:  CT head without contrast 01/20/2022 FINDINGS: Brain: Acute nonhemorrhagic right paramedian pontine infarct measures up to 9 mm. No acute supratentorial infarct is present. T2 signal changes are associated with the acute infarct. Moderate generalized atrophy is present. Minimal periventricular and subcortical T2 hyperintensities are within normal limits for age. The ventricles are proportionate to the degree of atrophy. No significant extraaxial fluid  collection is present. Deep brain nuclei are within normal limits. The internal auditory canals are within normal limits. Brainstem and cerebellum are otherwise unremarkable. Vascular: Flow is present in the major intracranial arteries. Skull and upper cervical spine: Degenerative changes are present in the upper cervical spine with at least mild central canal narrowing at C3-4. The craniocervical junction is normal. Marrow signal is normal. Midline structures are unremarkable. Sinuses/Orbits: The paranasal sinuses and mastoid air cells are clear. The globes and orbits are within normal limits. IMPRESSION: 1. Acute nonhemorrhagic right paramedian pontine infarct measures up to 9 mm. 2. Moderate generalized atrophy. 3. Degenerative changes in the upper cervical spine with at least mild central canal narrowing at C3-4. These results were called by telephone at the time of interpretation on 01/21/2022 at 7:31 am to provider Dr. Renaye Rakers, who verbally acknowledged these results. Electronically Signed   By: Marin Roberts M.D.   On: 01/21/2022 07:32   MR ANGIO HEAD WO CONTRAST  Result Date: 01/21/2022 CLINICAL DATA:  No deficit, acute, stroke suspected. Acute onset of left-sided weakness. Right pontine infarct. EXAM: MRA HEAD WITHOUT CONTRAST TECHNIQUE: Angiographic images of the Circle of Willis were acquired using MRA technique without  intravenous contrast. COMPARISON:  MR head without contrast 01/21/2022 FINDINGS: Anterior circulation: The internal carotid arteries are within normal limits from the high cervical segments through the ICA termini. Left A1 is dominant. The right A1 is aplastic. Both A2 segments fill from the left. M1 segments are within normal limits bilaterally. Moderate stenosis is present in the proximal posteroinferior right M2 segment. Moderate to high-grade stenoses are present in the proximal M3 segments of the more superior M2 division. There may be some artifact in this region. More distal segmental narrowing is present in the left MCA branches. Posterior circulation: The vertebral arteries are codominant. PICA origins are not visualized. Vertebrobasilar junction and basilar artery are normal. Proximal PCA vessels are within normal limits. Signal loss is present in the more distal vessels. Anatomic variants: Aplastic right A1 segment Other: None. IMPRESSION: 1. 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 2. Moderate stenosis in the proximal posteroinferior right M2 segment. 3. More distal segmental narrowing in the left MCA branches. 4. No significant proximal stenosis, aneurysm, or branch vessel occlusion in the circle-of-Willis. Electronically Signed   By: Marin Roberts M.D.   On: 01/21/2022 07:31   CT HEAD CODE STROKE WO CONTRAST  Result Date: 01/20/2022 CLINICAL DATA:  Code stroke.  Slurred speech EXAM: CT HEAD WITHOUT CONTRAST TECHNIQUE: Contiguous axial images were obtained from the base of the skull through the vertex without intravenous contrast. RADIATION DOSE REDUCTION: This exam was performed according to the departmental dose-optimization program which includes automated exposure control, adjustment of the mA and/or kV according to patient size and/or use of iterative reconstruction technique. COMPARISON:  None Available. FINDINGS: Brain: There is no  mass, hemorrhage or extra-axial collection. The size and configuration of the ventricles and extra-axial CSF spaces are normal. There is hypoattenuation of the periventricular white matter, most commonly indicating chronic ischemic microangiopathy. Vascular: No abnormal hyperdensity of the major intracranial arteries or dural venous sinuses. No intracranial atherosclerosis. Skull: The visualized skull base, calvarium and extracranial soft tissues are normal. Sinuses/Orbits: No fluid levels or advanced mucosal thickening of the visualized paranasal sinuses. No mastoid or middle ear effusion. The orbits are normal. ASPECTS [facility] Stroke Program Early CT Score) - Ganglionic level infarction (caudate, lentiform nuclei, internal capsule, insula, M1-M3  cortex): 7 - Supraganglionic infarction (M4-M6 cortex): 3 Total score (0-10 with 10 being normal): 10 IMPRESSION: 1. No acute intracranial abnormality. 2. ASPECTS is 10 These results were called by telephone at the time of interpretation on 01/20/2022 at 10:20 pm to provider [facility] , who verbally acknowledged these results. Electronically Signed   By: Deatra Robinson M.D.   On: 01/20/2022 22:20    Micro Results   No results found for this or any previous visit (from the past 240 hour(s)).  Today   Subjective    Arlester Marker today has no new complaints -Wife at bedside, -Eating and drinking well -Left-sided Appears to be improving       Patient has been seen and examined prior to discharge   Objective   Blood pressure (!) 148/81, pulse (!) 56, temperature 98.1 F (36.7 C), resp. rate 20, height 5\' 10"  (1.778 m), weight 95.4 kg, SpO2 96 %.   Intake/Output Summary (Last 24 hours) at 01/25/2022 1203 Last data filed at 01/25/2022 0700 Gross per 24 hour  Intake 480 ml  Output 200 ml  Net 280 ml    Exam Gen:- Awake Alert, in no acute distress  HEENT:- [place].AT, No sclera icterus Neck-Supple Neck,No JVD,.  Lungs-  CTAB , fair air movement  bilaterally  CV- S1, S2 normal, RRR Abd-  +ve B.Sounds, Abd Soft, No tenderness,    Extremity/Skin:- No  edema,   good pedal pulses  Psych-affect is appropriate, oriented x3 Neuro-speech improving, left-sided weakness and numbness continues to improve , no additional new focal deficits, no tremors   Data Review   CBC w Diff:  Lab Results  Component Value Date   WBC 4.1 01/22/2022   HGB 15.3 01/22/2022   HCT 44.9 01/22/2022   PLT 170 01/22/2022   LYMPHOPCT 45 01/20/2022   MONOPCT 9 01/20/2022   EOSPCT 3 01/20/2022   BASOPCT 0 01/20/2022    CMP:  Lab Results  Component Value Date   NA 138 01/22/2022   K 4.0 01/22/2022   CL 106 01/22/2022   CO2 26 01/22/2022   BUN 9 01/22/2022   CREATININE 0.85 01/22/2022   CREATININE 1.00 10/07/2016   PROT 6.5 01/21/2022   ALBUMIN 3.5 01/21/2022   BILITOT 0.5 01/21/2022   ALKPHOS 77 01/21/2022   AST 25 01/21/2022   ALT 24 01/21/2022  .  Total Discharge time is about 33 minutes  01/23/2022 M.D on 01/25/2022 at 12:03 PM  Go to [email or web address] -  for contact info  [facility] - Office  [phone]

## 2022-01-25 NOTE — Progress Notes (Signed)
Report called to care link 

## 2022-01-25 NOTE — H&P (Signed)
Physical Medicine and Rehabilitation Admission H&P        Chief Complaint  Patient presents with   Stroke Symptoms  : HPI: Randy [name] is a 71 year old right-handed male with history significant for BPH, glaucoma, systolic and diastolic heart failure, hyperlipidemia, hypertension, alcohol as well as tobacco use.  Per chart review patient lives with spouse.  1 level home multiple steps to entry.  Independent prior to admission and driving.  Presented to [facility] 01/20/2022 with acute onset of left-sided weakness/dizziness and slurred speech.  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  Moderate generalized atrophy.  MRI of the head and neck moderate to high-grade stenosis proximal M3 segments of the right superior division.  No significant proximal stenosis aneurysm or branch vessel occlusion in the circle of Willis.  Patient did not receive tPA.  Admission chemistries unremarkable except glucose 149, hemoglobin A1c 5.8, alcohol negative, urine drug screen negative.  Carotid ultrasound with no stenosis.  Echocardiogram with ejection fraction of 40 to 45%.  Left ventricle demonstrating global hypokinesis as well as grade 1 diastolic dysfunction.  Neurology follow-up placed on aspirin 81 mg daily and Plavix 75 mg daily x3 weeks then Plavix alone as documented by neurology services 01/21/2022.  Subcutaneous heparin for DVT prophylaxis.  Permissive hypertension and monitored.  Tolerating a regular diet.  Therapy evaluations completed due to patient's left-sided weakness and slurred speech was admitted for a comprehensive rehab program.   Review of Systems  Constitutional:  Negative for chills and fever.  HENT:  Negative for hearing loss.   Eyes:  Negative for blurred vision and double vision.  Respiratory:  Negative for cough, shortness of breath and wheezing.   Cardiovascular:  Negative for chest pain, palpitations and leg swelling.   Gastrointestinal:  Positive for constipation. Negative for heartburn, nausea and vomiting.  Genitourinary:  Positive for urgency. Negative for dysuria, flank pain and hematuria.  Musculoskeletal:  Positive for joint pain and myalgias.  Skin:  Negative for rash.  Neurological:  Positive for speech change and weakness.  Psychiatric/Behavioral:  Positive for depression.   All other systems reviewed and are negative.       Past Medical History:  Diagnosis Date   Angio-edema     Blepharospasm     BPH (benign prostatic hyperplasia)     Cervical myelopathy (HCC)     Glaucoma 2021   Hyperlipidemia     Nephrolithiasis     Optic neuropathy      Right   Urticaria           Past Surgical History:  Procedure Laterality Date   NECK SURGERY   2015   TONSILLECTOMY             Family History  Problem Relation Age of Onset   Heart attack Father     Dementia Sister     Arthritis Sister     Breast cancer Sister     Healthy Son     Healthy Daughter     Allergic rhinitis Neg Hx     Angioedema Neg Hx     Asthma Neg Hx     Atopy Neg Hx     Eczema Neg Hx     Immunodeficiency Neg Hx     Urticaria Neg Hx      Social History:  reports that he has been smoking cigarettes. He has never used smokeless tobacco. He reports current alcohol use. He reports that  he does not use drugs. Allergies:       Allergies  Allergen Reactions   Hornet Venom Anaphylaxis   Wasp Venom Anaphylaxis   Yellow Jacket Venom [Bee Venom] Anaphylaxis   Other        Dogs and Cats per allergy clinic           Medications Prior to Admission  Medication Sig Dispense Refill   aspirin EC 81 MG tablet Take 81 mg by mouth at bedtime.       atorvastatin (LIPITOR) 80 MG tablet Take 40 mg by mouth daily.        cetirizine (ZYRTEC) 10 MG tablet Take 2 tablets (20 mg total) by mouth 2 (two) times daily. (Patient taking differently: Take 10 mg by mouth daily.) 120 tablet 5   Cholecalciferol (VITAMIN D) 50 MCG (2000 UT) tablet  Take 2,000 Units by mouth daily.       diphenhydrAMINE (BENADRYL) 25 MG tablet Take 1 tablet (25 mg total) by mouth every 6 (six) hours. (Patient taking differently: Take 25 mg by mouth every 6 (six) hours as needed for allergies.) 20 tablet 0   DULoxetine (CYMBALTA) 30 MG capsule Take 30 mg by mouth daily.       EPINEPHrine 0.3 mg/0.3 mL IJ SOAJ injection Inject 0.3 mg into the muscle as needed for anaphylaxis.       famotidine (PEPCID) 20 MG tablet Take 20 mg by mouth at bedtime.       latanoprost (XALATAN) 0.005 % ophthalmic solution Place 1 drop into both eyes at bedtime.       lisinopril (PRINIVIL,ZESTRIL) 40 MG tablet Take 20 mg by mouth daily.        metoprolol succinate (TOPROL-XL) 25 MG 24 hr tablet Take 12.5 mg by mouth daily.       Multiple Vitamins-Minerals (CENTRUM) tablet Take 1 tablet by mouth daily.       naproxen sodium (ANAPROX) 550 MG tablet Take 550 mg by mouth 2 (two) times daily as needed for moderate pain.       omeprazole (PRILOSEC) 20 MG capsule Take 20 mg by mouth daily.       PARoxetine (PAXIL) 10 MG tablet Take 5 mg by mouth at bedtime.       terazosin (HYTRIN) 2 MG capsule Take 4 mg by mouth at bedtime.        polyethylene glycol-electrolytes (TRILYTE) 420 g solution Take 4,000 mLs by mouth as directed. (Patient not taking: Reported on 01/21/2022) 4000 mL 0          Home: Home Living Family/patient expects to be discharged to:: Private residence Living Arrangements: Spouse/significant other Available Help at Discharge: Family, Available PRN/intermittently Type of Home: Apartment Home Access: Stairs to enter [employer] of Steps: 12 Entrance Stairs-Rails: Right, Left, Can reach both Home Layout: One level Bathroom Shower/Tub: Health visitor: Standard Bathroom Accessibility: Yes Home Equipment: Cane - single point, BSC/3in1 Additional Comments: Per PT note  Lives With: Spouse   Functional History: Prior Function Prior Level of  Function : Independent/Modified Independent Mobility Comments: community ambulator w/o AD, drives ADLs Comments: Independent   Functional Status:  Mobility: Bed Mobility Overal bed mobility: Independent General bed mobility comments: labored movement Transfers Overall transfer level: Needs assistance Equipment used: Rolling walker (2 wheels) Transfers: Sit to/from Stand Sit to Stand: Supervision Bed to/from chair/wheelchair/BSC transfer type:: Step pivot Step pivot transfers: Supervision General transfer comment: chair to standing at sink and back to chair with RW  Ambulation/Gait Ambulation/Gait assistance: Min assist, Min guard Gait Distance (Feet): 85 Feet Assistive device: Rolling walker (2 wheels), 1 person hand held assist Gait Pattern/deviations: Decreased step length - right, Decreased step length - left, Decreased stride length, Ataxic, Staggering left, Scissoring General Gait Details: Min guard ambulating with RW for gait training, intermittent cues 55 feet in hallway, instruction for walker proximity, upright posture and symmetry of steps. Trialed ambulating without AD, pt requires min assist with intermittent LOB requiring physical assist to correct, ataxia more pronounced, wider BOS, ocassional scissoring and staggering towards left side. Gait velocity: decreased Gait velocity interpretation: <1.31 ft/sec, indicative of household ambulator   ADL: ADL Overall ADL's : Needs assistance/impaired Eating/Feeding: Set up, Sitting Grooming: Supervision/safety, Standing, Oral care Grooming Details (indicate cue type and reason): Pt able to stand at sink with RW available to brush teeth, lotion his face, and shave his head and face. Pt stood for ~13 minutes straight. Upper Body Bathing: Min guard, Minimal assistance, Sitting Lower Body Bathing: Minimal assistance, Moderate assistance Upper Body Dressing : Min guard, Minimal assistance Lower Body Dressing: Minimal assistance,  Sitting/lateral leans Lower Body Dressing Details (indicate cue type and reason): Pt needing assist to don L sock but able to doff both and don R sock without assist. Toilet Transfer: Min guard, Minimal assistance, Ambulation, Rolling walker (2 wheels) Toilet Transfer Details (indicate cue type and reason): Chair to toilet and back with RW. Toileting- ArchitectClothing Manipulation and Hygiene: Min guard, Sitting/lateral lean, Minimal assistance Tub/ Shower Transfer: Min guard, Minimal assistance, Ambulation Functional mobility during ADLs: Supervision/safety, Rolling walker (2 wheels) General ADL Comments: Pt able to ambulate to sink and back to chair with supervision using RW.   Cognition: Cognition Overall Cognitive Status: Within Functional Limits for tasks assessed Arousal/Alertness: Awake/alert Orientation Level: Oriented X4 Year: 2023 Month: October Day of Week: Incorrect Attention: Sustained Sustained Attention: Impaired Sustained Attention Impairment: Verbal complex Memory: Impaired Memory Impairment: Storage deficit Awareness: Impaired Awareness Impairment: Emergent impairment Problem Solving: Impaired Problem Solving Impairment: Verbal complex Executive Function: Self Monitoring Behaviors: Impulsive Safety/Judgment:  (to be determined) Cognition Arousal/Alertness: Awake/alert Behavior During Therapy: WFL for tasks assessed/performed Overall Cognitive Status: Within Functional Limits for tasks assessed Area of Impairment: Problem solving, Following commands Following Commands: Follows multi-step commands inconsistently, Follows one step commands inconsistently Problem Solving: Slow processing, Requires verbal cues General Comments: Occasional repetition required for instructions with multi-step tasks.   Physical Exam: Blood pressure (!) 148/81, pulse (!) 56, temperature 98.1 F (36.7 C), resp. rate 20, height 5\' 10"  (1.778 m), weight 97.2 kg, SpO2 96 %.  Physical  Exam Constitutional: No apparent distress. Appropriate appearance for age.  HENT: No JVD. Neck Supple. Trachea midline. Atraumatic, normocephalic.  Eyes: PERRLA. EOMI. Visual fields grossly intact. Mild nystagmus on rightward gaze Cardiovascular: RRR, no murmurs/rub/gallops. No Edema. Peripheral pulses 2+. +TEDs Respiratory: CTAB. No rales, rhonchi, or wheezing. Dry, recurrent cough. On RA.  Abdomen: + bowel sounds, normoactive. No distention or tenderness.  GU: Not examined  Skin: + Dry, erythematous rash around upper lip, nares, and forehead. No pustules or open sores.   MSK:      No apparent deformity.      Strength:                RUE: 5/5 SA, 5/5 EF, 5/5 EE, 5/5 WE, 5/5 FF, 5/5 FA                 LUE: 3/5 SA, 4-/5 EF, 4-/5 EE, 4-/5 WE,  4-/5 FF, 3+/5 FA                RLE: 5/5 HF, 5/5 KE, 5/5 DF, 5/5 EHL, 5/5 PF                 LLE:  4-/5 HF, 5-/5 KE, 5-/5 DF, 5-/5 EHL, 5-/5 PF   Neurologic exam:  Cognition: AAO to person, place, time and event.  Language: Fluent, No substitutions or neoglisms. Mild dysarthria. Names 3/3 objects correctly.  Memory: Recalls 3/3 objects at 5 minutes. No apparent deficits  Insight: Good insight into current condition.  Mood: Pleasant affect, appropriate mood.  Sensation: To light touch intact in BL UEs and LEs  Reflexes: 2+ in BL UE and LEs. Negative Hoffman's and babinski signs bilaterally.  CN: + L facial droop, + L shoulder shrug weakness. Otherwise intact Coordination: No apparent tremors. + Ataxia and drift LUE Spasticity: MAS 0 in all extremities.           Lab Results Last 48 Hours  No results found for this or any previous visit (from the past 48 hour(s)).     Imaging Results (Last 48 hours)  No results found.         Blood pressure (!) 148/81, pulse (!) 56, temperature 98.1 F (36.7 C), resp. rate 20, height 5\' 10"  (1.778 m), weight 97.2 kg, SpO2 96 %.   Medical Problem List and Plan: 1. Functional deficits secondary to right  paramedian pontine infarction.  Plan 30-day cardiac event monitor on discharge             -patient may shower             -ELOS/Goals: 10-14 days, Mod I PT/OT/SLP             - Primary deficits RUE > RLE hemiparesis and vertigo, worse when laying down  2.  Antithrombotics: -DVT/anticoagulation:  Pharmaceutical: Heparin             -antiplatelet therapy: Aspirin 81 mg daily and Plavix 75 mg day x3 weeks then Plavix alone (per neurology consult note; DC summary lists as continue ASA alone).   3. Pain Management: Tylenol PRN, Oxycodone 5 mg every 6 hours as needed    4. Mood/Behavior/Sleep: Cymbalta 30 mg daily, Paxil 5 mg nightly.  Provide emotional support             -antipsychotic agents: N/A  5. Neuropsych/cognition: This patient is capable of making decisions on his own behalf.  6. Skin/Wound Care: Routine skin checks  7. Fluids/Electrolytes/Nutrition: Routine in and outs with follow-up chemistries.  8.  Permissive hypertension (up to 220/110, normotension goal at 5-7 days).  Coreg 12.5 mg twice daily, lisinopril 20 mg daily.  Monitor with increased mobility  9.  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  Monitor for any signs of fluid overload.  Follow-up outpatient cardiology services  10.  History of alcohol tobacco use (half ppd).  NicoDerm patch.  Provide counseling.  Monitor for any signs of withdrawal.               - + chronic cough, likely underlying chronic lung dz; add PRN duonebs  11.  Hyperlipidemia.  Lipitor  12.  BPH.  Check PVR.  Patient on Hytrin 4 mg nightly prior to admission held for permissive hypertension.  Resume as needed  13. Rosacea. Takes OTC cream at home; advised wife bring in and can add do med list.   14. Constipation.  Having 2x BM yesterday but endorsing hard/difficult to pass stools.         - Start Miralax daily + Sennakot-S PRN  15. OSA: noncompliant with home CPAP. Monitor vitals.    Mcarthur Rossetti Angiulli,  PA-C 01/25/2022  I have examined the patient independently and edited the note for HPI, ROS, exam, assessment, and plan as appropriate. I am in agreement with the above recommendations.   Angelina Sheriff, DO 01/25/2022

## 2022-01-26 DIAGNOSIS — I6350 Cerebral infarction due to unspecified occlusion or stenosis of unspecified cerebral artery: Secondary | ICD-10-CM | POA: Diagnosis not present

## 2022-01-26 NOTE — Evaluation (Signed)
Physical Therapy Assessment and Plan  Patient Details  Name: Randy [name] MRN: [id number] Date of Birth: [birth year]  PT Diagnosis: Abnormal posture, Abnormality of gait, Coordination disorder, Hemiplegia non-dominant, and Muscle weakness Rehab Potential: Excellent ELOS: 9-12 days   Today's Date: 01/26/2022 PT Individual Time: 9030-0923    69 min   Hospital Problem: Principal Problem:   Right pontine cerebrovascular accident [facility])   Past Medical History:  Past Medical History:  Diagnosis Date   Angio-edema    Blepharospasm    BPH (benign prostatic hyperplasia)    Cervical myelopathy ([place])    Glaucoma 2021   Hyperlipidemia    Nephrolithiasis    Optic neuropathy    Right   Urticaria    Past Surgical History:  Past Surgical History:  Procedure Laterality Date   NECK SURGERY  2015   TONSILLECTOMY      Assessment & Plan Clinical Impression: Patient is a 71 year old right-handed male with history significant for BPH, glaucoma, systolic and diastolic heart failure, hyperlipidemia, hypertension, alcohol as well as tobacco use.  Per chart review patient lives with spouse.  1 level home multiple steps to entry.  Independent prior to admission and driving.  [facility]ed to [facility] with acute onset of left-sided weakness/dizziness and slurred speech.  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  Moderate generalized atrophy.  MRI of the head and neck moderate to high-grade stenosis proximal M3 segments of the right superior division.  No significant proximal stenosis aneurysm or branch vessel occlusion in the circle of Willis.  Patient did not receive tPA.  Admission chemistries unremarkable except glucose 149, hemoglobin A1c 5.8, alcohol negative, urine drug screen negative.  Carotid ultrasound with no stenosis.  Echocardiogram with ejection fraction of 40 to 45%.  Left ventricle demonstrating global hypokinesis as well as  grade 1 diastolic dysfunction.  Neurology follow-up placed on aspirin 81 mg daily and Plavix 75 mg daily x3 weeks then Plavix alone as documented by neurology services 01/21/2022.  Subcutaneous heparin for DVT prophylaxis.  Permissive hypertension and monitored.  Tolerating a regular diet.   Patient transferred to CIR on 01/25/2022 .   Patient currently requires min with mobility secondary to muscle weakness and muscle joint tightness, decreased cardiorespiratoy endurance, unbalanced muscle activation and decreased coordination, decreased attention to left, and decreased sitting balance, decreased standing balance, decreased postural control, hemiplegia, and decreased balance strategies.  Prior to hospitalization, patient was independent  with mobility and lived with Spouse in a [place] home.  Home access is 12Stairs to enter.  Patient will benefit from skilled PT intervention to maximize safe functional mobility and minimize fall risk for planned discharge home with intermittent assist.  Anticipate patient will benefit from follow up OP at discharge.  PT - End of Session Activity Tolerance: Tolerates 10 - 20 min activity with multiple rests Endurance Deficit: Yes PT Assessment Rehab Potential (ACUTE/IP ONLY): Excellent PT Barriers to Discharge: [place] home environment;Decreased caregiver support;Home environment access/layout;Incontinence;Lack of/limited family support;Insurance for SNF coverage;Behavior PT Patient demonstrates impairments in the following area(s): Balance;Behavior;Endurance;Motor;Safety PT Transfers Functional Problem(s): Bed Mobility;Bed to Chair;Car;Furniture;Floor PT Locomotion Functional Problem(s): Ambulation;Wheelchair Mobility;Stairs PT Plan PT Intensity: Minimum of 1-2 x/day ,45 to 90 minutes PT Frequency: 5 out of 7 days PT Duration Estimated Length of Stay: 9-12 days PT Treatment/Interventions: Ambulation/gait training;Functional mobility training;Discharge  planning;Psychosocial support;Therapeutic Activities;Visual/perceptual remediation/compensation;Balance/vestibular training;Disease management/prevention;Neuromuscular re-education;Skin care/wound management;Wheelchair propulsion/positioning;Therapeutic Exercise;Cognitive remediation/compensation;DME/adaptive equipment instruction;Pain management;Splinting/orthotics;UE/LE Strength taining/ROM;Community reintegration;Functional electrical stimulation;Patient/family education;Stair training;UE/LE Coordination activities PT  Transfers Anticipated Outcome(s): mod I with LRAD PT Locomotion Anticipated Outcome(s): Mod I with LRAD ambulatory PT Recommendation Recommendations for Other Services: Therapeutic Recreation consult Therapeutic Recreation Interventions: Stress management;Outing/community reintergration Follow Up Recommendations: Home health PT;Outpatient PT Patient destination: Home Equipment Recommended: To be determined   PT Evaluation Precautions/Restrictions Precautions Precautions: Fall Precaution Comments: mild L hemi, old L RCT Restrictions Weight Bearing Restrictions: No General Chart Reviewed: Yes Vital Signs Pain Pain Assessment Pain Scale: 0-10 Pain Score: 0-No pain Pain Interference Pain Interference Pain Effect on Sleep: 1. Rarely or not at all Pain Interference with Therapy Activities: 1. Rarely or not at all Pain Interference with Day-to-Day Activities: 1. Rarely or not at all Home Living/Prior [place] Available Help at Discharge: Family;Available PRN/intermittently (wife works at night 12 hour shifts) Type of Home: Apartment Home Access: Stairs to enter [employer] of Steps: 12 Entrance Stairs-Rails: Right;Left;Can reach both Home Layout: One level Bathroom Shower/Tub: Scientist, research (life sciences): No  Lives With: Spouse Prior Function Level of Independence: Independent with basic ADLs;Independent with gait;Independent  with homemaking with ambulation  Able to Take Stairs?: Yes Driving: Yes Vocation: Retired Vision/Perception  Vision - History Ability to See in Adequate Light: 1 Impaired Vision - Assessment Additional Comments: L inattention, pt reports hx of visual/color changes in R eye Perception Perception: Impaired Praxis Praxis: Impaired Praxis Impairment Details: Motor planning  Cognition Overall Cognitive Status: Impaired/Different from baseline Arousal/Alertness: Awake/alert Memory: Impaired Awareness: Impaired Behaviors: Impulsive Safety/Judgment: Impaired Sensation Sensation Light Touch: Appears Intact Coordination Gross Motor Movements are Fluid and Coordinated: No Fine Motor Movements are Fluid and Coordinated: No Finger Nose Finger Test: mild ataxia on the L side with end range tremor on the RUE Heel Shin Test: mild L sided ataxia Motor  Motor Motor: Hemiplegia;Ataxia Motor - Skilled Clinical Observations: L sided Hemiplegia and mild Ataxia   Trunk/Postural Assessment  Cervical Assessment Cervical Assessment: Exceptions to [facility] (R head tilt) Thoracic Assessment Thoracic Assessment: Exceptions to [facility] (rounded shoulders) Lumbar Assessment Lumbar Assessment: Exceptions to [facility] Postural Control Postural Control: Deficits on evaluation  Balance Balance Balance Assessed: Yes Static Sitting Balance Static Sitting - Balance Support: Feet unsupported;No upper extremity supported Static Sitting - Level of Assistance: 5: Stand by assistance Dynamic Sitting Balance Dynamic Sitting - Balance Support: Feet unsupported;No upper extremity supported Dynamic Sitting - Level of Assistance: 4: Min assist Dynamic Sitting - Balance Activities: Lateral lean/weight shifting;Forward lean/weight shifting;Reaching for objects Static Standing Balance Static Standing - Balance Support: During functional activity;Bilateral upper extremity supported Static Standing - Level of Assistance: 5: Stand  by assistance Dynamic Standing Balance Dynamic Standing - Balance Support: During functional activity Dynamic Standing - Level of Assistance: 4: Min assist Extremity Assessment  RUE Assessment RUE Assessment: Within Functional Limits LUE Assessment LUE Assessment: Exceptions to [facility] Active Range of Motion (AROM) Comments: approx 30-40* flexion and abduction, limited movement in L shoulder but pt states this is "worse than before the stroke" General Strength Comments: old RCT injury not repaired, in addition to new L hemi from CVA LUE Body System: Neuro Brunstrum levels for arm and hand: Arm Brunstrum level for arm: Stage IV Movement is deviating from synergy RLE Assessment RLE Assessment: Exceptions to [facility] General Strength Comments: 4+/5 to 5/5 LLE Assessment LLE Assessment: Exceptions to [facility] General Strength Comments: 4+/5 to 5/5 with delayed initiation  Care Tool Care Tool Bed Mobility Roll left and right activity   Roll left and right assist level: Supervision/Verbal cueing    Sit  to lying activity   Sit to lying assist level: Supervision/Verbal cueing    Lying to sitting on side of bed activity   Lying to sitting on side of bed assist level: the ability to move from lying on the back to sitting on the side of the bed with no back support.: Supervision/Verbal cueing     Care Tool Transfers Sit to stand transfer   Sit to stand assist level: Minimal Assistance - Patient > 75%    Chair/bed transfer   Chair/bed transfer assist level: Minimal Assistance - Patient > 75%     Toilet transfer   Assist Level: Minimal Assistance - Patient > 75%    Car transfer   Car transfer assist level: Minimal Assistance - Patient > 75%      Care Tool Locomotion Ambulation   Assist level: Minimal Assistance - Patient > 75% Assistive device: No Device Max distance: 156f  Walk 10 feet activity   Assist level: Minimal Assistance - Patient > 75% Assistive device: No Device   Walk 50  feet with 2 turns activity   Assist level: Minimal Assistance - Patient > 75% Assistive device: No Device  Walk 150 feet activity   Assist level: Minimal Assistance - Patient > 75% Assistive device: No Device  Walk 10 feet on uneven surfaces activity   Assist level: Minimal Assistance - Patient > 75% Assistive device: Other (comment) (rail up ramp)  Stairs   Assist level: Minimal Assistance - Patient > 75% Stairs assistive device: 2 hand rails Max number of stairs: 12  Walk up/down 1 step activity   Walk up/down 1 step (curb) assist level: Minimal Assistance - Patient > 75% Walk up/down 1 step or curb assistive device: 2 hand rails  Walk up/down 4 steps activity   Walk up/down 4 steps assist level: Minimal Assistance - Patient > 75% Walk up/down 4 steps assistive device: 2 hand rails  Walk up/down 12 steps activity   Walk up/down 12 steps assist level: Minimal Assistance - Patient > 75%    Pick up small objects from floor Pick up small object from the floor (from standing position) activity did not occur: Safety/medical concerns Pick up small object from the floor assist level: Moderate Assistance - Patient 50 - 74%    Wheelchair Is the patient using a wheelchair?: No Type of Wheelchair: Manual   Wheelchair assist level: Minimal Assistance - Patient > 75% Max wheelchair distance: 1524f Wheel 50 feet with 2 turns activity   Assist Level: Minimal Assistance - Patient > 75%  Wheel 150 feet activity   Assist Level: Minimal Assistance - Patient > 75%    Refer to Care Plan for Long Term Goals  SHORT TERM GOAL WEEK 1 PT Short Term Goal 1 (Week 1): Pt will perform all transfers with superision assist PT Short Term Goal 2 (Week 1): Pt will improve Berg balance score >45 to indicate reduced fall risk PT Short Term Goal 3 (Week 1): Pt will ambulate 15085fith supersion assist with LRAD  Recommendations for other services: Therapeutic Recreation  Stress management and Outing/community  reintegration  Skilled Therapeutic Intervention Mobility Transfers Transfers: Sit to Stand;Stand to SitLockheed Martinansfers Sit to Stand: Moderate Assistance - Patient 50-74% Stand to Sit: Minimal Assistance - Patient > 75% Stand Pivot Transfers: Minimal Assistance - Patient > 75% Stand Pivot Transfer Details: Verbal cues for sequencing;Verbal cues for safe use of DME/AE;Verbal cues for technique;Verbal cues for precautions/safety Transfer (Assistive device): Rolling walker Locomotion  Gait Ambulation: Yes Gait Assistance: Minimal Assistance - Patient > 75% Gait Distance (Feet): 150 Feet Assistive device: None Gait Gait: Yes Gait Pattern: Impaired Gait Pattern: Ataxic Stairs / Additional Locomotion Stairs: Yes Stairs Assistance: Minimal Assistance - Patient > 75% Stair Management Technique: Two rails Number of Stairs: 12 Height of Stairs: 6 Wheelchair Mobility Wheelchair Mobility: Yes Wheelchair Assistance: Minimal assistance - Patient >75% Wheelchair Propulsion: Both upper extremities Wheelchair Parts Management: Needs assistance Distance: 150  Pt received supine in bed and agreeable to PT. Supine>sit transfer with supervsion assist and cues for safety and awareness of LUE. PT instructed patient in PT Evaluation and initiated treatment intervention; see above for results. PT educated patient in [place], rehab potential, rehab goals, and discharge recommendations along with recommendation for follow-up rehabilitation services.  Pt reports need to urination while PT obtaining supplies. NT present to transport pt to bathroom. Pt received sitting on toilet. Lower body Clohting change with mod assist for time management pt performed ambulatory transfer to sink fr hand hygiene and oral care with RW and CGA. Pt transported to rehab gym in [facility]. Gait training with no AD x 124f with min assis tfor safety. Stair management with min assist and moderate ecues forstep to gait pattern and  attention to the LUE. Car transfer perfotrmed min assist and moderate cues for safety, but pt perform SLS to access and exit car despite cues form PT for sit>pivot. Gait ttraining  over rmap with BUE supported on rails with cues for posture. Pt returned to room and performed stand pivot transfer to bed with CGA and no AD. Sit>supine completed with supervision assist, and left supine in bed with call bell in reach and all needs met.      Discharge Criteria: Patient will be discharged from PT if patient refuses treatment 3 consecutive times without medical reason, if treatment goals not met, if there is a change in medical status, if patient makes no progress towards goals or if patient is discharged from hospital.  The above assessment, treatment plan, treatment alternatives and goals were discussed and mutually agreed upon: by patient  ALorie Phenix10/21/2023, 1:05 PM

## 2022-01-26 NOTE — Progress Notes (Signed)
PROGRESS NOTE   Subjective/Complaints: Pt reports no pain LBM yesterday Said R sided weakness- meant L sided weakness    ROS:  Pt denies SOB, abd pain, CP, N/V/C/D, and vision changes   Objective:   No results found. Recent Labs    01/25/22 1359  WBC 4.3  HGB 15.3  HCT 45.8  PLT 168   Recent Labs    01/25/22 1359  CREATININE 0.93    Intake/Output Summary (Last 24 hours) at 01/26/2022 1045 Last data filed at 01/26/2022 0816 Gross per 24 hour  Intake 840 ml  Output 2100 ml  Net -1260 ml        Physical Exam: Vital Signs Blood pressure (!) 144/74, pulse (!) 55, temperature 98.2 F (36.8 C), resp. rate 16, height 5\' 10"  (1.778 m), weight 98.2 kg, SpO2 98 %.   General: awake, alert, appropriate, sitting EOB; family member in room; NAD HENT: conjugate gaze; oropharynx moist CV: bradycardic rate; no JVD Pulmonary: CTA B/L; no W/R/R- good air movement GI: soft, NT, ND, (+)BS Psychiatric: appropriate- flat Neurological: word substitutions noted- fixed with cuses   Skin: + Dry, erythematous rash around upper lip, nares, and forehead. No pustules or open sores.    MSK:      No apparent deformity.      Strength:                RUE: 5/5 SA, 5/5 EF, 5/5 EE, 5/5 WE, 5/5 FF, 5/5 FA                 LUE: 3/5 SA, 4-/5 EF, 4-/5 EE, 4-/5 WE, 4-/5 FF, 3+/5 FA                RLE: 5/5 HF, 5/5 KE, 5/5 DF, 5/5 EHL, 5/5 PF                 LLE:  4-/5 HF, 5-/5 KE, 5-/5 DF, 5-/5 EHL, 5-/5 PF    Neurologic exam:  Cognition: AAO to person, place, time and event.  Language: Fluent, No substitutions or neoglisms. Mild dysarthria. Names 3/3 objects correctly.  Memory: Recalls 3/3 objects at 5 minutes. No apparent deficits  Insight: Good insight into current condition.  Mood: Pleasant affect, appropriate mood.  Sensation: To light touch intact in BL UEs and LEs  Reflexes: 2+ in BL UE and LEs. Negative Hoffman's and  babinski signs bilaterally.  CN: + L facial droop, + L shoulder shrug weakness. Otherwise intact Coordination: No apparent tremors. + Ataxia and drift LUE Spasticity: MAS 0 in all extremities.   Assessment/Plan: 1. Functional deficits which require 3+ hours per day of interdisciplinary therapy in a comprehensive inpatient rehab setting. Physiatrist is providing close team supervision and 24 hour management of active medical problems listed below. Physiatrist and rehab team continue to assess barriers to discharge/monitor patient progress toward functional and medical goals  Care Tool:  Bathing    Body parts bathed by patient: Right arm, Left arm, Chest, Abdomen, Front perineal area, Right upper leg, Left upper leg, Face   Body parts bathed by helper: Buttocks, Right lower leg, Left lower leg     Bathing assist Assist  Level: Moderate Assistance - Patient 50 - 74%     Upper Body Dressing/Undressing Upper body dressing   What is the patient wearing?: Pull over shirt    Upper body assist Assist Level: Minimal Assistance - Patient > 75%    Lower Body Dressing/Undressing Lower body dressing      What is the patient wearing?: Pants     Lower body assist Assist for lower body dressing: Moderate Assistance - Patient 50 - 74%     Toileting Toileting    Toileting assist Assist for toileting: Minimal Assistance - Patient > 75%     Transfers Chair/bed transfer  Transfers assist     Chair/bed transfer assist level: Minimal Assistance - Patient > 75%     Locomotion Ambulation   Ambulation assist              Walk 10 feet activity   Assist           Walk 50 feet activity   Assist           Walk 150 feet activity   Assist           Walk 10 feet on uneven surface  activity   Assist           Wheelchair     Assist               Wheelchair 50 feet with 2 turns activity    Assist            Wheelchair 150 feet  activity     Assist          Blood pressure (!) 144/74, pulse (!) 55, temperature 98.2 F (36.8 C), resp. rate 16, height 5\' 10"  (1.778 m), weight 98.2 kg, SpO2 98 %.  Medical Problem List and Plan: 1. 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  Plan 30-day cardiac event monitor on discharge             -patient may shower             -ELOS/Goals: 10-14 days, Mod I PT/OT/SLP             - Primary deficits RUE > RLE hemiparesis and vertigo, worse when laying down   Con't CIR- first day of evaluations- PT/OT and SLP 2.  Antithrombotics: -DVT/anticoagulation:  Pharmaceutical: Heparin             -antiplatelet therapy: Aspirin 81 mg daily and Plavix 75 mg day x3 weeks then Plavix alone (per neurology consult note; DC summary lists as continue ASA alone).    3. Pain Management: Tylenol PRN, Oxycodone 5 mg every 6 hours as needed     10/21- denies pain- con't regimen prn 4. Mood/Behavior/Sleep: Cymbalta 30 mg daily, Paxil 5 mg nightly.  Provide emotional support             -antipsychotic agents: N/A   5. Neuropsych/cognition: This patient is capable of making decisions on his own behalf.   6. Skin/Wound Care: Routine skin checks   7. Fluids/Electrolytes/Nutrition: Routine in and outs with follow-up chemistries.   8.  Permissive hypertension (up to 220/110, normotension goal at 5-7 days).  Coreg 12.5 mg twice daily, lisinopril 20 mg daily.  Monitor with increased mobility   10/21- BP very slightly elevated- con't regimen for now 9.  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  Monitor for any signs of fluid overload.  Follow-up outpatient cardiology services   10.  History of alcohol tobacco use (half ppd).  NicoDerm patch.  Provide counseling.  Monitor for any signs of withdrawal.               - + chronic cough, likely underlying chronic lung dz; add PRN duonebs   10/21- no cough heard this AM-con't regimen 11.   Hyperlipidemia.  Lipitor   12.  BPH.  Check PVR.  Patient on Hytrin 4 mg nightly prior to admission held for permissive hypertension.  Resume as needed   13. Rosacea. Takes OTC cream at home; advised wife bring in and can add do med list.    14. Constipation. Having 2x BM yesterday but endorsing hard/difficult to pass stools.         - Start Miralax daily + Sennakot-S PRN   10/21- LBM yesterday- con't to monitor  15. OSA: noncompliant with home CPAP. Monitor vitals.    LOS: 1 days A FACE TO FACE EVALUATION WAS PERFORMED  Caylea Foronda 01/26/2022, 10:45 AM

## 2022-01-26 NOTE — Evaluation (Signed)
Occupational Therapy Assessment and Plan  Patient Details  Name: Randy [name] MRN: [id number] Date of Birth: 1950/05/01  OT Diagnosis: abnormal posture, ataxia, cognitive deficits, hemiplegia affecting non-dominant side, and muscle weakness (generalized) Rehab Potential:   ELOS: 10-12 days   Today's Date: 01/26/2022 OT Individual Time: 5397-6734 OT Individual Time Calculation (min): 70 min     Hospital Problem: Principal Problem:   Right pontine cerebrovascular accident [facility])   Past Medical History:  Past Medical History:  Diagnosis Date   Angio-edema    Blepharospasm    BPH (benign prostatic hyperplasia)    Cervical myelopathy ([place])    Glaucoma 2021   Hyperlipidemia    Nephrolithiasis    Optic neuropathy    Right   Urticaria    Past Surgical History:  Past Surgical History:  Procedure Laterality Date   NECK SURGERY  2015   TONSILLECTOMY      Assessment & Plan Clinical Impression: Randy [name] is a 71 year old right-handed male with history significant for BPH, glaucoma, systolic and diastolic heart failure, hyperlipidemia, hypertension, alcohol as well as tobacco use.  Per chart review patient lives with spouse.  1 level home multiple steps to entry.  Independent prior to admission and driving.  Presented to [facility] 01/20/2022 with acute onset of left-sided weakness/dizziness and slurred speech.  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  Moderate generalized atrophy.  MRI of the head and neck moderate to high-grade stenosis proximal M3 segments of the right superior division.  No significant proximal stenosis aneurysm or branch vessel occlusion in the circle of Willis.  Patient did not receive tPA.  Admission chemistries unremarkable except glucose 149, hemoglobin A1c 5.8, alcohol negative, urine drug screen negative.  Carotid ultrasound with no stenosis.  Echocardiogram with ejection fraction of 40 to 45%.  Left  ventricle demonstrating global hypokinesis as well as grade 1 diastolic dysfunction.  Neurology follow-up placed on aspirin 81 mg daily and Plavix 75 mg daily x3 weeks then Plavix alone as documented by neurology services 01/21/2022.  Subcutaneous heparin for DVT prophylaxis.  Permissive hypertension and monitored.  Tolerating a regular diet.  Therapy evaluations completed due to patient's left-sided weakness and slurred speech was admitted for a comprehensive rehab program.  Patient currently requires mod with basic self-care skills secondary to muscle weakness, decreased cardiorespiratoy endurance, impaired timing and sequencing, abnormal tone, unbalanced muscle activation, ataxia, decreased coordination, and decreased motor planning, decreased visual perceptual skills, decreased attention to left and decreased motor planning, decreased initiation, decreased attention, decreased awareness, decreased problem solving, decreased safety awareness, and delayed processing, and decreased standing balance, decreased postural control, hemiplegia, and decreased balance strategies.  Prior to hospitalization, patient could complete BADL with independent .  Patient will benefit from skilled intervention to increase independence with basic self-care skills prior to discharge home with care partner.  Anticipate patient will require intermittent supervision and follow up home health.  OT - End of Session Activity Tolerance: Tolerates 30+ min activity with multiple rests Endurance Deficit: Yes OT Assessment OT Patient demonstrates impairments in the following area(s): Balance;Cognition;Endurance;Motor;Perception;Safety;Sensory;Vision OT Basic ADL's Functional Problem(s): Eating;Grooming;Bathing;Dressing;Toileting OT Advanced ADL's Functional Problem(s): Simple Meal Preparation OT Transfers Functional Problem(s): Toilet;Tub/Shower OT Additional Impairment(s): Fuctional Use of Upper Extremity OT Plan OT Intensity:  Minimum of 1-2 x/day, 45 to 90 minutes OT Frequency: 5 out of 7 days OT Duration/Estimated Length of Stay: 10-12 days OT Treatment/Interventions: Balance/vestibular training;Disease mangement/prevention;Neuromuscular re-education;Self Care/advanced ADL retraining;Therapeutic Exercise;Wheelchair propulsion/positioning;Cognitive remediation/compensation;DME/adaptive equipment instruction;Skin care/wound  managment;UE/LE Strength taining/ROM;Community reintegration;Functional electrical stimulation;Patient/family education;Splinting/orthotics;UE/LE Coordination activities;Visual/perceptual remediation/compensation;Therapeutic Activities;Psychosocial support;Functional mobility training;Discharge planning;Pain management OT Self Feeding Anticipated Outcome(s): Independent OT Basic Self-Care Anticipated Outcome(s): Mod I - supervision OT Toileting Anticipated Outcome(s): Mod I OT Bathroom Transfers Anticipated Outcome(s): Mod I OT Recommendation Patient destination: Home Follow Up Recommendations: Home health OT Equipment Recommended: To be determined   OT Evaluation Precautions/Restrictions  Precautions Precautions: Fall Precaution Comments: mild L hemi, old L RCT Restrictions Weight Bearing Restrictions: No Pain Pain Assessment Pain Scale: 0-10 Pain Score: 0-No pain Home Living/Prior Functioning Home Living Family/patient expects to be discharged to:: Private residence Living Arrangements: Spouse/significant other Available Help at Discharge: Family, Available PRN/intermittently (wife works at night 12 hour shifts) Type of Home: Apartment Home Access: Stairs to enter Technical brewer of Steps: 12 Entrance Stairs-Rails: Right, Left, Can reach both Home Layout: One level Bathroom Shower/Tub: Research officer, trade union Accessibility: No  Lives With: Spouse Prior Function Level of Independence: Independent with basic ADLs, Independent with gait, Independent with homemaking with  ambulation  Able to Take Stairs?: Yes Driving: Yes Vocation: Retired Surveyor, mining Baseline Vision/History: 1 Wears glasses Ability to See in Adequate Light: 1 Impaired Patient Visual Report: Other (comment);No change from baseline Vision Assessment?: Vision impaired- to be further tested in functional context Additional Comments: L inattention, pt reports hx of visual/color changes in R eye Perception  Perception: Impaired Praxis Praxis: Impaired Praxis Impairment Details: Motor planning Cognition Cognition Overall Cognitive Status: Impaired/Different from baseline Arousal/Alertness: Awake/alert Orientation Level: Person;Place;Situation Person: Oriented Place: Oriented Situation: Oriented Memory: Impaired Awareness: Impaired Behaviors: Impulsive Safety/Judgment: Impaired Brief Interview for Mental Status (BIMS) Repetition of Three Words (First Attempt): 3 Temporal Orientation: Year: Correct Temporal Orientation: Month: Accurate within 5 days Temporal Orientation: Day: Incorrect Recall: "Sock": Yes, no cue required Recall: "Blue": Yes, no cue required Recall: "Bed": No, could not recall BIMS Summary Score: 12 Sensation Sensation Light Touch: Appears Intact Coordination Gross Motor Movements are Fluid and Coordinated: No Fine Motor Movements are Fluid and Coordinated: No Finger Nose Finger Test: mild ataxia on the L side with end range tremor on the RUE Heel Shin Test: mild L sided ataxia Motor  Motor Motor: Hemiplegia;Ataxia Motor - Skilled Clinical Observations: L sided Hemiplegia and mild Ataxia  Trunk/Postural Assessment  Cervical Assessment Cervical Assessment: Exceptions to [facility] (R head tilt) Thoracic Assessment Thoracic Assessment: Exceptions to [facility] (rounded shoulders) Lumbar Assessment Lumbar Assessment: Exceptions to [facility] Postural Control Postural Control: Deficits on evaluation  Balance Balance Balance Assessed: Yes Static Sitting Balance Static Sitting -  Balance Support: Feet unsupported;No upper extremity supported Static Sitting - Level of Assistance: 5: Stand by assistance Dynamic Sitting Balance Dynamic Sitting - Balance Support: Feet unsupported;No upper extremity supported Dynamic Sitting - Level of Assistance: 4: Min assist Dynamic Sitting - Balance Activities: Lateral lean/weight shifting;Forward lean/weight shifting;Reaching for objects Static Standing Balance Static Standing - Balance Support: During functional activity;Bilateral upper extremity supported Static Standing - Level of Assistance: 5: Stand by assistance Dynamic Standing Balance Dynamic Standing - Balance Support: During functional activity Dynamic Standing - Level of Assistance: 4: Min assist Extremity/Trunk Assessment RUE Assessment RUE Assessment: Within Functional Limits LUE Assessment LUE Assessment: Exceptions to [facility] Active Range of Motion (AROM) Comments: approx 30-40* flexion and abduction, limited movement in L shoulder but pt states this is "worse than before the stroke" General Strength Comments: old RCT injury not repaired, in addition to new L hemi from CVA LUE Body System: Neuro Brunstrum levels for arm and hand: Arm Brunstrum  level for arm: Stage IV Movement is deviating from synergy  Care Tool Care Tool Self Care Eating   Eating Assist Level: Set up assist    Oral Care    Oral Care Assist Level: Supervision/Verbal cueing    Bathing   Body parts bathed by patient: Right arm;Left arm;Chest;Abdomen;Front perineal area;Right upper leg;Left upper leg;Face Body parts bathed by helper: Buttocks;Right lower leg;Left lower leg   Assist Level: Moderate Assistance - Patient 50 - 74%    Upper Body Dressing(including orthotics)   What is the patient wearing?: Pull over shirt   Assist Level: Minimal Assistance - Patient > 75%    Lower Body Dressing (excluding footwear)   What is the patient wearing?: Pants Assist for lower body dressing: Moderate  Assistance - Patient 50 - 74%    Putting on/Taking off footwear   What is the patient wearing?: Ted hose;Socks Assist for footwear: Maximal Assistance - Patient 25 - 49%       Care Tool Toileting Toileting activity   Assist for toileting: Minimal Assistance - Patient > 75%     Care Tool Bed Mobility Roll left and right activity    CGA    Sit to lying activity   Sit to lying assist level: Contact Guard/Touching assist    Lying to sitting on side of bed activity   Lying to sitting on side of bed assist level: the ability to move from lying on the back to sitting on the side of the bed with no back support.: Contact Guard/Touching assist     Care Tool Transfers Sit to stand transfer   Sit to stand assist level: Moderate Assistance - Patient 50 - 74%    Chair/bed transfer   Chair/bed transfer assist level: Minimal Assistance - Patient > 75%     Toilet transfer   Assist Level: Minimal Assistance - Patient > 75%     Care Tool Cognition  Expression of Ideas and Wants Expression of Ideas and Wants: 3. Some difficulty - exhibits some difficulty with expressing needs and ideas (e.g, some words or finishing thoughts) or speech is not clear  Understanding Verbal and Non-Verbal Content Understanding Verbal and Non-Verbal Content: 3. Usually understands - understands most conversations, but misses some part/intent of message. Requires cues at times to understand   Memory/Recall Ability Memory/Recall Ability : Current season;That he or she is in a hospital/hospital unit   Refer to Care Plan for [place] 1 OT Short Term Goal 1 (Week 1): Pt will perform toilet transfer with LRAD with Supervision OT Short Term Goal 2 (Week 1): Pt will perform 3/3 toileting tasks with Supervision OT Short Term Goal 3 (Week 1): Pt will perform UB/LB dress with supervision using hemitechniques OT Short Term Goal 4 (Week 1): Pt will improve LUE to grasp/release and translate 4/4  items  Recommendations for other services: None    Skilled Therapeutic Intervention ADL ADL Eating: Set up Grooming: Supervision/safety Upper Body Bathing: Minimal assistance Lower Body Bathing: Moderate assistance Upper Body Dressing: Minimal assistance Lower Body Dressing: Moderate assistance Toileting: Minimal assistance Toilet Transfer: Minimal assistance Toilet Transfer Method: Ambulating Mobility  Transfers Sit to Stand: Moderate Assistance - Patient 50-74% Stand to Sit: Minimal Assistance - Patient > 75%   Skilled Interventions: Pt greeted at time of session sitting up at EOB finishing eating breakfast. Discussion and education with the pt regarding ELOS, CIR, and OT POC. Pt verbalized understanding and agreeable. No pain. Pt declined  shower at this time but performing simulated ADL routine with MIN A for UB ADL and MOD A for LB ADL with limited use of LUE. Pt performing functional mobility in room with RW to/from bathroom with CGA/MIN and transferring to toilet/bed/surfaces same manner. Discussion with pt and spouse regarding DME needs and DC plan for home. Pt performing dynamic standing activities and oral hygiene/grooming with supervision. Set up at end of session alarm on call bell in reach.    Discharge Criteria: Patient will be discharged from OT if patient refuses treatment 3 consecutive times without medical reason, if treatment goals not met, if there is a change in medical status, if patient makes no progress towards goals or if patient is discharged from hospital.  The above assessment, treatment plan, treatment alternatives and goals were discussed and mutually agreed upon: by patient  Viona Gilmore 01/26/2022, 12:50 PM

## 2022-01-26 NOTE — Evaluation (Signed)
Speech Language Pathology Assessment and Plan  Patient Details  Name: Randy [name] MRN: [id number] Date of Birth: 09-21-1950  SLP Diagnosis: Cognitive Impairments;Dysarthria  Rehab Potential: Good ELOS: 7-10 days    Today's Date: 01/26/2022 SLP Individual Time: 0093-8182 SLP Individual Time Calculation (min): 55 min   Hospital Problem: Principal Problem:   Right pontine cerebrovascular accident [facility])  Past Medical History:  Past Medical History:  Diagnosis Date   Angio-edema    Blepharospasm    BPH (benign prostatic hyperplasia)    Cervical myelopathy ([place])    Glaucoma 2021   Hyperlipidemia    Nephrolithiasis    Optic neuropathy    Right   Urticaria    Past Surgical History:  Past Surgical History:  Procedure Laterality Date   NECK SURGERY  2015   TONSILLECTOMY      Assessment / Plan / Recommendation Clinical Impression History of Present Illness:  71 year old right-handed male with history significant for BPH, glaucoma, systolic and diastolic heart failure, hyperlipidemia, hypertension, alcohol as well as tobacco use. Presented to [facility]/20/2022 with acute onset of left-sided weakness/dizziness and slurred speech.  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  Moderate generalized atrophy.  MRI of the head and neck moderate to high-grade stenosis proximal M3 segments of the right superior division.  No significant proximal stenosis aneurysm or branch vessel occlusion in the circle of Willis.  Patient did not receive tPA.  Admission chemistries unremarkable except glucose 149, hemoglobin A1c 5.8, alcohol negative, urine drug screen negative.  Carotid ultrasound with no stenosis.  Echocardiogram with ejection fraction of 40 to 45%.  Left ventricle demonstrating global hypokinesis as well as grade 1 diastolic dysfunction.  Neurology follow-up placed on aspirin 81 mg daily and Plavix 75 mg daily x3 weeks then Plavix alone.   Subcutaneous heparin for DVT prophylaxis.  Permissive hypertension and monitored.  Tolerating a regular diet.    SLP consulted to complete CSE, motor speech, and cognitive-linguistic evaluation in the setting of acute R pontine CVA. Pt encountered awake/alert and lying semi-reclined in bed. Agreeable to ST evaluation. Per pt reports, he states he feels he is approaching his baseline.  Per [facility] SLUMS assessments findings, pt presents with at least mild-moderate cognitive deficits as evident by achieving a score of 18/30 (n = 27 for individuals with a HS diploma); this appears to be 2 points lower than when assessment was given during acute hospitalization on 01/21/2022. More specifically, cognitive deficits were observed in the areas of memory (short-term and working), visuospatial skills, executive functioning, and higher-level attention. Abnormal pragmatics were also observed to include decreased eye contact and abnormal affect at times. I do question if this is pt's baseline given "moderate generalized atrophy" impressions from MRI completed on 01/21/2022 and site of lesion. Expressive language appears grossly intact, with command following impacted at times by decreased working memory and attention vs language impairment. Speech was fluent. Additionally, pt presents with mild dysarthria c/b imprecise articulation, which pt compensates for ~90% of the time to achieve 95% intelligibility. CSE appeared unremarkable despite decreased L orofacial symmetry. No clinical s/sx concerning for oral nor pharyngeal dysphagia observed despite challenging. Pt reports that prior to admission, he was managing his medications and finances independently.   Given current functional deficits, recommend short-course of ST intervention to provide motor speech education and ensure cognition is at baseline. Results and recommendations were reviewed with pt and he verbalized understanding. Recommend continuation of current diet  consistencies (regular textures  with thin liquids) given set-up assistance. Additionally, may benefit from neuropsychological and therapeutic recreation evaluations during CIR admission to maximize independence and decrease caregiver burden.   Skilled Therapeutic Interventions          CSE, motor speech, and SLUMS administered. Please see above for details.  SLP Assessment  Patient will need skilled Speech Lanaguage Pathology Services during CIR admission    Recommendations  SLP Diet Recommendations: Age appropriate regular solids;Thin Liquid Administration via: Cup;Straw Medication Administration: Whole meds with liquid Supervision: Patient able to self feed Compensations: Small sips/bites;Slow rate;Follow solids with liquid Postural Changes and/or Swallow Maneuvers: Seated upright 90 degrees Oral Care Recommendations: Oral care BID Recommendations for Other Services: Neuropsych consult;Therapeutic Recreation consult Therapeutic Recreation Interventions: Outing/community reintergration Patient destination: Home Follow up Recommendations: 24 hour supervision/assistance Equipment Recommended: None recommended by SLP    SLP Frequency 3 to 5 out of 7 days   SLP Duration  SLP Intensity  SLP Treatment/Interventions 7-10 days  Minumum of 1-2 x/day, 30 to 90 minutes  Cognitive remediation/compensation;Patient/family education;Functional tasks    Pain Pain Assessment Pain Scale: 0-10 Pain Score: 0-No pain  Prior Functioning Cognitive/Linguistic Baseline: Within functional limits Type of Home: Apartment  Lives With: Spouse Available Help at Discharge: Family;Available PRN/intermittently (wife works 12 hour shifts) Education: college - BS in [place]: Retired  SLP Evaluation Cognition Overall Cognitive Status: Impaired/Different from baseline Arousal/Alertness: Awake/alert Orientation Level: Oriented X4 Year: 2023 Month: October Day of Week: Correct Attention:  Sustained Sustained Attention: Impaired Sustained Attention Impairment: Verbal complex Memory: Impaired Memory Impairment: Storage deficit;Retrieval deficit Decreased Short Term Memory: Verbal complex Awareness: Impaired Awareness Impairment: Emergent impairment Problem Solving: Impaired Problem Solving Impairment: Verbal complex Executive Function: Self Monitoring Self Monitoring: Impaired Behaviors: Impulsive Safety/Judgment: Impaired  Comprehension Auditory Comprehension Overall Auditory Comprehension: Impaired Yes/No Questions: Within Functional Limits Commands: Impaired Complex Commands: 75-100% accurate Conversation: Complex Interfering Components: Attention;Working Curator: Within [employer] Reading Comprehension Reading Status: Within funtional limits Expression Expression Primary Mode of Expression: Verbal Verbal Expression Overall Verbal Expression: Appears within functional limits for tasks assessed Initiation: No impairment Level of Generative/Spontaneous Verbalization: Conversation Repetition: No impairment Pragmatics: Impairment Impairments: Eye contact;Abnormal affect Non-Verbal Means of Communication: Not applicable Written Expression Dominant Hand: Right Written Expression: Not tested Oral Motor Oral Motor/Sensory Function Overall Oral Motor/Sensory Function: Mild impairment Facial ROM: Reduced left Facial Symmetry: Abnormal symmetry left Facial Strength: Within Functional Limits Facial Sensation: Within Functional Limits Lingual ROM: Within Functional Limits Lingual Symmetry: Within Functional Limits Lingual Strength: Within Functional Limits Lingual Sensation: Within Functional Limits Velum: Within Functional Limits Mandible: Within Functional Limits Motor Speech Overall Motor Speech: Impaired Respiration: Within functional limits Phonation: Normal Resonance: Within functional  limits Articulation: Impaired Level of Impairment: Conversation Intelligibility: Intelligibility reduced Word: 75-100% accurate Phrase: 75-100% accurate Sentence: 75-100% accurate Conversation: 75-100% accurate Motor Planning: Witnin functional limits Motor Speech Errors: Not applicable  Care Tool Care Tool Cognition Ability to hear (with hearing aid or hearing appliances if normally used Ability to hear (with hearing aid or hearing appliances if normally used): 0. Adequate - no difficulty in normal conservation, social interaction, listening to TV   Expression of Ideas and Wants Expression of Ideas and Wants: 3. Some difficulty - exhibits some difficulty with expressing needs and ideas (e.g, some words or finishing thoughts) or speech is not clear   Understanding Verbal and Non-Verbal Content Understanding Verbal and Non-Verbal Content: 3. Usually understands - understands most conversations, but misses some part/intent of message.  Requires cues at times to understand  Memory/Recall Ability Memory/Recall Ability : Current season;That he or she is in a hospital/hospital unit;Location of own room   Bedside Swallowing Assessment General Date of Onset: 01/21/22 Previous Swallow Assessment: [facility] Swallowing Screen Diet Prior to this Study: Regular;Thin liquids Temperature Spikes Noted: No Respiratory Status: Room air History of Recent Intubation: No Behavior/Cognition: Alert;Cooperative;Pleasant mood Oral Cavity - Dentition: Dentures, top Self-Feeding Abilities: Able to feed self Patient Positioning: Upright in bed Baseline Vocal Quality: Normal Volitional Cough: Strong Volitional Swallow: Able to elicit  Ice Chips Ice chips: Not tested Thin Liquid Thin Liquid: Within functional limits Presentation: Straw Nectar Thick Nectar Thick Liquid: Not tested Honey Thick Honey Thick Liquid: Not tested Puree Puree: Not tested Solid Solid: Within functional limits Presentation: Self  Fed BSE Assessment Suspected Esophageal Findings Suspected Esophageal Findings:  (N/A) Risk for Aspiration Impact on safety and function: No limitations  Short Term Goals: Week 1: SLP Short Term Goal 1 (Week 1): STG's = LTG's due to ELOS  Refer to Care Plan for Long Term Goals  Recommendations for other services: Neuropsych and Therapeutic Recreation  Outing/community reintegration  Discharge Criteria: Patient will be discharged from SLP if patient refuses treatment 3 consecutive times without medical reason, if treatment goals not met, if there is a change in medical status, if patient makes no progress towards goals or if patient is discharged from hospital.  The above assessment, treatment plan, treatment alternatives and goals were discussed and mutually agreed upon: by patient  Romelle Starcher A Yeraldine Forney 01/26/2022, 2:57 PM

## 2022-01-26 NOTE — Plan of Care (Signed)
  Problem: RH Balance Goal: LTG Patient will maintain dynamic standing balance (PT) Description: LTG:  Patient will maintain dynamic standing balance with assistance during mobility activities (PT) Flowsheets (Taken 01/26/2022 1118) LTG: Pt will maintain dynamic standing balance during mobility activities with:: Independent with assistive device    Problem: RH Bed Mobility Goal: LTG Patient will perform bed mobility with assist (PT) Description: LTG: Patient will perform bed mobility with assistance, with/without cues (PT). Flowsheets (Taken 01/26/2022 1118) LTG: Pt will perform bed mobility with assistance level of: Independent   Problem: RH Bed to Chair Transfers Goal: LTG Patient will perform bed/chair transfers w/assist (PT) Description: LTG: Patient will perform bed to chair transfers with assistance (PT). Flowsheets (Taken 01/26/2022 1118) LTG: Pt will perform Bed to Chair Transfers with assistance level: Independent with assistive device    Problem: RH Car Transfers Goal: LTG Patient will perform car transfers with assist (PT) Description: LTG: Patient will perform car transfers with assistance (PT). Flowsheets (Taken 01/26/2022 1118) LTG: Pt will perform car transfers with assist:: Supervision/Verbal cueing   Problem: RH Furniture Transfers Goal: LTG Patient will perform furniture transfers w/assist (OT/PT) Description: LTG: Patient will perform furniture transfers  with assistance (OT/PT). Flowsheets (Taken 01/26/2022 1118) LTG: Pt will perform furniture transfers with assist:: Independent with assistive device    Problem: RH Ambulation Goal: LTG Patient will ambulate in controlled environment (PT) Description: LTG: Patient will ambulate in a controlled environment, # of feet with assistance (PT). Flowsheets (Taken 01/26/2022 1118) LTG: Pt will ambulate in controlled environ  assist needed:: Independent with assistive device LTG: Ambulation distance in controlled  environment: 166ft with LRAD Goal: LTG Patient will ambulate in home environment (PT) Description: LTG: Patient will ambulate in home environment, # of feet with assistance (PT). Flowsheets (Taken 01/26/2022 1118) LTG: Pt will ambulate in home environ  assist needed:: Independent with assistive device LTG: Ambulation distance in home environment: 63ft with LRAD Goal: LTG Patient will ambulate in community environment (PT) Description: LTG: Patient will ambulate in community environment, # of feet with assistance (PT). Flowsheets (Taken 01/26/2022 1118) LTG: Pt will ambulate in community environ  assist needed:: Supervision/Verbal cueing LTG: Ambulation distance in community environment: 144ft   Problem: RH Stairs Goal: LTG Patient will ambulate up and down stairs w/assist (PT) Description: LTG: Patient will ambulate up and down # of stairs with assistance (PT) Flowsheets (Taken 01/26/2022 1118) LTG: Pt will ambulate up/down stairs assist needed:: Supervision/Verbal cueing LTG: Pt will  ambulate up and down number of stairs: 12 steps to acces home with at least 1 rail

## 2022-01-26 NOTE — Plan of Care (Signed)
  Problem: RH Expression Communication Goal: LTG Patient will increase speech intelligibility (SLP) Description: LTG: Patient will increase speech intelligibility at word/phrase/conversation level with cues, % of the time (SLP) Flowsheets (Taken 01/26/2022 1545) LTG: Patient will increase speech intelligibility (SLP): Modified Independent Level: Conversation level Percent of time patient will use intelligible speech: 100   Problem: RH Awareness Goal: LTG: Patient will demonstrate awareness during functional activites type of (SLP) Description: LTG: Patient will demonstrate awareness during functional activites type of (SLP) Flowsheets (Taken 01/26/2022 1545) Patient will demonstrate during cognitive/linguistic activities awareness type of:  Intellectual  Emergent LTG: Patient will demonstrate awareness during cognitive/linguistic activities with assistance of (SLP): Modified Independent

## 2022-01-27 NOTE — Progress Notes (Signed)
Occupational Therapy Session Note  Patient Details  Name: Randy [name] MRN: [id number] Date of Birth: Aug 24, 1950  Today's Date: 01/27/2022 OT Individual Time: 0950-1050 OT Individual Time Calculation (min): 60 min    Short Term Goals: Week 1:  OT Short Term Goal 1 (Week 1): Pt will perform toilet transfer with LRAD with Supervision OT Short Term Goal 2 (Week 1): Pt will perform 3/3 toileting tasks with Supervision OT Short Term Goal 3 (Week 1): Pt will perform UB/LB dress with supervision using hemitechniques OT Short Term Goal 4 (Week 1): Pt will improve LUE to grasp/release and translate 4/4 items  Skilled Therapeutic Interventions/Progress Updates:    Upon OT arrival, pt on toilet reporting no pain and is agreeable to OT treatment. Treatment intervention with a focus on self care retraining  Pt completes full shower ADL at the levels below and requires increased time to complete. Pt performs functional mobility with RW and CGA and transfers with CGA. Pt donns lotion on his whole body with Setup. Pt returns to supine with Supervision. Pt was issued orange theraputty medium soft to squeeze 10 times in B UE and pull apart small pieces of theraputty and roll into balls. Pt was left in his bed at end of session with all needs met and safety measures in place.   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 Restrictions Weight Bearing Restrictions: No  ADL: Grooming: Contact guard Where Assessed-Grooming: Standing at sink Upper Body Bathing: Contact guard Where Assessed-Upper Body Bathing: Shower Lower Body Bathing: Contact guard Where Assessed-Lower Body Bathing: Shower Upper Body Dressing: Setup Lower Body Dressing: Minimal assistance (to pull up pants over L hip) Toileting: Minimal assistance (to pull up pants over L hip) Where Assessed-Toileting: Glass blower/designer: Therapist, music Method:  Counselling psychologist: Energy manager: Curator Method: Heritage manager: Radio broadcast assistant    Therapy/Group: Individual Therapy  Dante Roudebush 01/27/2022, 10:30 AM

## 2022-01-27 NOTE — Progress Notes (Signed)
Denies pain. Slept good. Contin[employer]. LBM 10/21. Using urinal. Randy [name]

## 2022-01-28 ENCOUNTER — Other Ambulatory Visit: Payer: Self-pay | Admitting: Cardiology

## 2022-01-28 DIAGNOSIS — I6350 Cerebral infarction due to unspecified occlusion or stenosis of unspecified cerebral artery: Secondary | ICD-10-CM | POA: Diagnosis not present

## 2022-01-28 LAB — COMPREHENSIVE METABOLIC PANEL
ALT: 54 U/L — ABNORMAL HIGH (ref 0–44)
AST: 38 U/L (ref 15–41)
Albumin: 3.4 g/dL — ABNORMAL LOW (ref 3.5–5.0)
Alkaline Phosphatase: 65 U/L (ref 38–126)
Anion gap: 8 (ref 5–15)
BUN: 12 mg/dL (ref 8–23)
CO2: 26 mmol/L (ref 22–32)
Calcium: 9.6 mg/dL (ref 8.9–10.3)
Chloride: 104 mmol/L (ref 98–111)
Creatinine, Ser: 0.96 mg/dL (ref 0.61–1.24)
GFR, Estimated: >60 mL/min (ref >60)
Glucose, Bld: 127 mg/dL — ABNORMAL HIGH (ref 70–99)
Potassium: 4.3 mmol/L (ref 3.5–5.1)
Sodium: 138 mmol/L (ref 135–145)
Total Bilirubin: 0.7 mg/dL (ref 0.3–1.2)
Total Protein: 6.3 g/dL — ABNORMAL LOW (ref 6.5–8.1)

## 2022-01-28 LAB — CBC WITH DIFFERENTIAL/PLATELET
Abs Immature Granulocytes: 0 10*3/uL (ref 0.00–0.07)
Basophils Absolute: 0 10*3/uL (ref 0.0–0.1)
Basophils Relative: 0 %
Eosinophils Absolute: 0.2 10*3/uL (ref 0.0–0.5)
Eosinophils Relative: 5 %
HCT: 44.8 % (ref 39.0–52.0)
Hemoglobin: 15.1 g/dL (ref 13.0–17.0)
Immature Granulocytes: 0 %
Lymphocytes Relative: 41 %
Lymphs Abs: 2 10*3/uL (ref 0.7–4.0)
MCH: 32 pg (ref 26.0–34.0)
MCHC: 33.7 g/dL (ref 30.0–36.0)
MCV: 94.9 fL (ref 80.0–100.0)
Monocytes Absolute: 0.8 10*3/uL (ref 0.1–1.0)
Monocytes Relative: 17 %
Neutro Abs: 1.8 10*3/uL (ref 1.7–7.7)
Neutrophils Relative %: 37 %
Platelets: 181 10*3/uL (ref 150–400)
RBC: 4.72 MIL/uL (ref 4.22–5.81)
RDW: 14 % (ref 11.5–15.5)
WBC: 4.8 10*3/uL (ref 4.0–10.5)
nRBC: 0 % (ref 0.0–0.2)

## 2022-01-28 NOTE — Progress Notes (Signed)
Speech Language Pathology Daily Session Note  Patient Details  Name: Randy [name] MRN: [id number] Date of Birth: [birth year]  Today's Date: 01/28/2022 SLP Individual Time: 5993-5701 SLP Individual Time Calculation (min): 53 min  Short Term Goals: Week 1: SLP Short Term Goal 1 (Week 1): STG's = LTG's due to ELOS  Skilled Therapeutic Interventions: Skilled ST treatment focused on cognitive-linguistic goals. SLP and pt completing medication management task to increase awareness of current medication regime. Prior to education, pt was familiar with ~75% of medications from PLOF by either name or purpose (often times not in combination). Pt had general awareness of newly prescribed medications however most often by purpose and minimal familiarity of name. Pt/SLP discussed current medications and wrote down pertinent information on individualized medication chart which contained the following information: name of medication, dose, instructions, and purpose for taking. Pt utilized chart with sup fading to mod I for effectiveness. Pt responded to hypothetical scenarios pertaining to mediation management with safe, thoughtful, and thorough responses with good awareness into medication safety. Pt exhibited overall good awareness of current deficits and was able to anticipate needs by discussing how he needs to currently get around environment differently with use of walker, ambulating at a slower pace, being cautious, etc. Pt demonstrated such behavior when ambulating to and from therapy using RW and overall CGA. Patient was left in recliner with alarm activated and immediate needs within reach at end of session. Continue per current plan of care.       Pain Pain Assessment Pain Scale: 0-10 Pain Score: 3  Pain Type: Acute pain Pain Location: Leg Pain Orientation: Left Pain Descriptors / Indicators: Sore;Dull Pain Intervention(s): Medication (See eMAR)  Therapy/Group: Individual Therapy  Randy [name] T  Randy [name] 01/28/2022, 10:30 AM

## 2022-01-28 NOTE — Progress Notes (Signed)
Inpatient [facility] Individual Statement of Services  Patient Name:  Randy [name]  Date:  01/28/2022  Welcome to the [facility].  Our goal is to provide you with an individualized program based on your diagnosis and situation, designed to meet your specific needs.  With this comprehensive rehabilitation program, you will be expected to participate in at least 3 hours of rehabilitation therapies Monday-Friday, with modified therapy programming on the weekends.  Your rehabilitation program will include the following services:  Physical Therapy (PT), Occupational Therapy (OT), Speech Therapy (ST), 24 hour per day rehabilitation nursing, Therapeutic Recreaction (TR), Neuropsychology, Care Coordinator, Rehabilitation Medicine, Nutrition Services, Pharmacy Services, and Other  Weekly team conferences will be held on Wednesdays to discuss your progress.  Your Inpatient Rehabilitation Care Coordinator will talk with you frequently to get your input and to update you on team discussions.  Team conferences with you and your family in attendance may also be held.  Expected length of stay: 7-10 Days  Overall anticipated outcome:  MOD I  Depending on your progress and recovery, your program may change. Your Inpatient Rehabilitation Care Coordinator will coordinate services and will keep you informed of any changes. Your Inpatient Rehabilitation Care Coordinator's name and contact numbers are listed  below.  The following services may also be recommended but are not provided by the [facility]:   [place] will be made to provide these services after discharge if needed.  Arrangements include referral to agencies that provide these services.  Your insurance has been verified to be:   [place] Your primary doctor is:  Allayne Butcher, NP  Pertinent information will be  shared with your doctor and your insurance company.  Inpatient Rehabilitation Care Coordinator:  Erlene Quan, [place] or [phone]  Information discussed with and copy given to patient by: Dyanne Iha, 01/28/2022, 11:32 AM

## 2022-01-28 NOTE — Progress Notes (Signed)
PROGRESS NOTE   Subjective/Complaints:  Pt verbose, working with OT Discussed his hx of visual issues, s/p oculoplasty as well as Blepharospasm Cervical radiculopathy and surgery with residual sensory issues in RUE  Also discussed Left knee cartilage injury from football  ROS:  Pt denies SOB, abd pain, CP, N/V/C/D, and vision changes   Objective:   No results found. Recent Labs    01/25/22 1359 01/28/22 0509  WBC 4.3 4.8  HGB 15.3 15.1  HCT 45.8 44.8  PLT 168 181    Recent Labs    01/25/22 1359 01/28/22 0509  NA  --  138  K  --  4.3  CL  --  104  CO2  --  26  GLUCOSE  --  127*  BUN  --  12  CREATININE 0.93 0.96  CALCIUM  --  9.6     Intake/Output Summary (Last 24 hours) at 01/28/2022 0844 Last data filed at 01/28/2022 0826 Gross per 24 hour  Intake 577 ml  Output 400 ml  Net 177 ml         Physical Exam: Vital Signs Blood pressure 131/72, pulse 61, temperature 97.9 F (36.6 C), temperature source Oral, resp. rate 20, height 5\' 10"  (1.778 m), weight 98.2 kg, SpO2 95 %.   General: No acute distress Mood and affect are appropriate Heart: Regular rate and rhythm no rubs murmurs or extra sounds Lungs: Clear to auscultation, breathing unlabored, no rales or wheezes Abdomen: Positive bowel sounds, soft nontender to palpation, nondistended Extremities: No clubbing, cyanosis, or edema   MSK:      No apparent deformity.Reduced Left shoulder AROM , left knee crepitus      Strength:                RUE: 5/5 SA, 5/5 EF, 5/5 EE, 5/5 WE, 5/5 FF, 5/5 FA                 LUE: 3/5 SA, 4-/5 EF, 4-/5 EE, 4-/5 WE, 4-/5 FF, 3+/5 FA                RLE: 5/5 HF, 5/5 KE, 5/5 DF, 5/5 EHL, 5/5 PF                 LLE:  4-/5 HF, 5-/5 KE, 5-/5 DF, 5-/5 EHL, 5-/5 PF    Neurologic exam:  Cognition: AAO to person, place, time and event.  Language: Fluent, No substitutions or neoglisms. Mild dysarthria. Names 3/3 objects  correctly.  Memory: Recalls 3/3 objects at 5 minutes. No apparent deficits  Insight: Good insight into current condition.  Mood: Pleasant affect, appropriate mood.  Sensation: To light touch intact in BL UEs and LEs   Coordination: No apparent tremors. + Ataxia and drift LUE Spasticity: MAS 0 in all extremities.   Assessment/Plan: 1. Functional deficits which require 3+ hours per day of interdisciplinary therapy in a comprehensive inpatient rehab setting. Physiatrist is providing close team supervision and 24 hour management of active medical problems listed below. Physiatrist and rehab team continue to assess barriers to discharge/monitor patient progress toward functional and medical goals  Care Tool:  Bathing    Body parts  bathed by patient: Right arm, Left arm, Chest, Abdomen, Front perineal area, Right upper leg, Left upper leg, Face   Body parts bathed by helper: Buttocks, Right lower leg, Left lower leg     Bathing assist Assist Level: Moderate Assistance - Patient 50 - 74%     Upper Body Dressing/Undressing Upper body dressing   What is the patient wearing?: Pull over shirt    Upper body assist Assist Level: Minimal Assistance - Patient > 75%    Lower Body Dressing/Undressing Lower body dressing      What is the patient wearing?: Pants     Lower body assist Assist for lower body dressing: Moderate Assistance - Patient 50 - 74%     Toileting Toileting    Toileting assist Assist for toileting: Minimal Assistance - Patient > 75%     Transfers Chair/bed transfer  Transfers assist     Chair/bed transfer assist level: Minimal Assistance - Patient > 75%     Locomotion Ambulation   Ambulation assist      Assist level: Minimal Assistance - Patient > 75% Assistive device: No Device Max distance: 118ft   Walk 10 feet activity   Assist     Assist level: Minimal Assistance - Patient > 75% Assistive device: No Device   Walk 50 feet  activity   Assist    Assist level: Minimal Assistance - Patient > 75% Assistive device: No Device    Walk 150 feet activity   Assist    Assist level: Minimal Assistance - Patient > 75% Assistive device: No Device    Walk 10 feet on uneven surface  activity   Assist     Assist level: Minimal Assistance - Patient > 75% Assistive device: Other (comment) (rail up ramp)   Wheelchair     Assist Is the patient using a wheelchair?: No Type of Wheelchair: Manual    Wheelchair assist level: Minimal Assistance - Patient > 75% Max wheelchair distance: 181ft    Wheelchair 50 feet with 2 turns activity    Assist        Assist Level: Minimal Assistance - Patient > 75%   Wheelchair 150 feet activity     Assist      Assist Level: Minimal Assistance - Patient > 75%   Blood pressure 131/72, pulse 61, temperature 97.9 F (36.6 C), temperature source Oral, resp. rate 20, height 5\' 10"  (1.778 m), weight 98.2 kg, SpO2 95 %.  Medical Problem List and Plan: 1. 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  Plan 30-day cardiac event monitor on discharge             -patient may shower             -ELOS/Goals: 10-14 days, Mod I PT/OT/SLP             - Primary deficits RUE > RLE hemiparesis and vertigo, worse when laying down   Con't CIR- first day of evaluations- PT/OT and SLP 2.  Antithrombotics: -DVT/anticoagulation:  Pharmaceutical: Heparin             -antiplatelet therapy: Aspirin 81 mg daily and Plavix 75 mg day x3 weeks then Plavix alone (per neurology consult note; DC summary lists as continue ASA alone).    3. Pain Management: Tylenol PRN, Oxycodone 5 mg every 6 hours as needed     10/21- denies pain- con't regimen prn 4. Mood/Behavior/Sleep: Cymbalta 30 mg daily, Paxil 5 mg nightly.  Provide emotional support             -  antipsychotic agents: N/A   5. Neuropsych/cognition: This patient is capable of making decisions on his own  behalf.   6. Skin/Wound Care: Routine skin checks   7. Fluids/Electrolytes/Nutrition: Routine in and outs with follow-up chemistries.   8.  Permissive hypertension (up to 220/110, normotension goal at 5-7 days).  Coreg 12.5 mg twice daily, lisinopril 20 mg daily.  Monitor with increased mobility    Vitals:   01/27/22 1934 01/28/22 0254  BP: (!) 143/67 131/72  Pulse: (!) 59 61  Resp: 20 20  Temp: 98.3 F (36.8 C) 97.9 F (36.6 C)  SpO2: 95% 95%  Controlled , has bradycardia   9.  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  Monitor for any signs of fluid overload.  Follow-up outpatient cardiology services   10.  History of alcohol and tobacco use (half ppd).  NicoDerm patch.  Provide counseling.  Monitor for any signs of withdrawal.               - + chronic cough, likely underlying chronic lung dz; add PRN duonebs   10/21- no cough heard this AM-con't regimen 11.  Hyperlipidemia.  Lipitor   12.  BPH.  Check PVR.  Patient on Hytrin 4 mg nightly prior to admission held for permissive hypertension.  Resume as needed   13. Rosacea. Takes OTC cream at home; advised wife bring in and can add do med list.    14. Constipation. Having 2x BM yesterday but endorsing hard/difficult to pass stools.         - Start Miralax daily + Sennakot-S PRN   10/21- LBM yesterday- con't to monitor  15. OSA: noncompliant with home CPAP. Monitor vitals.  16. Hx of blepharospasm , receives botox injections   LOS: 3 days A FACE TO Wallace E Tytianna Greenley 01/28/2022, 8:44 AM

## 2022-01-28 NOTE — Progress Notes (Signed)
Physical Therapy Session Note  Patient Details  Name: Randy [name] MRN: [id number] Date of Birth: 1950-11-04  Today's Date: 01/28/2022 PT Individual Time: 1331-1447 PT Individual Time Calculation (min): 76 min   Short Term Goals: Week 1:  PT Short Term Goal 1 (Week 1): Pt will perform all transfers with superision assist PT Short Term Goal 2 (Week 1): Pt will improve Berg balance score >45 to indicate reduced fall risk PT Short Term Goal 3 (Week 1): Pt will ambulate 169ft with supersion assist with LRAD  Skilled Therapeutic Interventions/Progress Updates:  Patient supine in bed on entrance to room. Patient alert and agreeable to PT session.   Patient with no pain complaint at start of session.  Therapeutic Activity: Bed Mobility: Pt performed supine <> sit with bed rails lowered and bed flattened. Completes with CGA.  VC/ tc required for effort. Transfers: Pt performed sit<>stand and stand pivot transfers throughout session with MinA for improving forward lean and for appropriate hand placement. NMR performed during session to improve technique to close supervision. Provided verbal cues for improving technique. Ambulatory toilet transfer with no AD performed with CGA/ MinA and use of R safety rail.  Changed brief with ModA. Self care with ModA.  Gait Training:  Pt ambulated >125' using RW into day room. Pt relates that his L sided weakness leaves him with decreased step height/ length and fear of catching toe with LLE. Education provided re: continuous conscious practice required in order to maintain foot clearance with exaggeration of stepheight/ length as well as DF throughout. As L side is re-learning similar to when pt was learning to walk initially. Pt then ambulated with no AD to maint therapy gym with one instance of L toe catch d/t uneven floor.   Pt demos ability to ambulate forward, then able to close eyes for 10 feet with minimal change in gait pattern. Walks backward for 10  ft with moderate change in gait pattern only stepping with significant decrease in step length and time spent on each LE.   Wheelchair Mobility:  Pt propelled wheelchair 50 feet with supervision requiring vc for increasing effort with LUE. Instructed on technique for turns and for turn in place. Difficult to perform d/t LUE weakness. Pt then allowed to use BUE and LLE for improved propel and correction in steering. Reaches 65 feet with consistent cues to incorporate LE. Provided vc throughout  for technique.  Neuromuscular Re-ed: NMR facilitated during session with focus on standing balance, muscle recruitment, motor control,. Pt challenged with forward lunges over tape line primarily using LLE. Some ataxic tremors noted with LLE initially during control into and out of lunge and quiets with continued movement. Challenged with standing on Airex with self perturbations. Wobbly initially and improves with practice.   6" steps used with BHR and pt able to toe touch to first, second and thrid steps with BLE. With RLE toe touches, pt requires consistent vc/ tc for maintaining LLE knee extension. Lunges performed x10 for each step and then progressed to reciprocating toe touches x10 to second step and then to third step.  Guided in sit<.stand improved performance with BLE and placing Bil hands on knees. Instructed in forward scoot to edge of seat, foot placement, forward lean and timiing of engagement of BLE to push into extension. Pt relates feeling as though he's going to fall forward. Educated that established motor plan uses that feeling to engage BLE to stand. Initially pt uses BLE extension into mat table to assist with  rise to stand, but then is able to progress to rise to stand without extension into mat table. Blocked practice performed into learning and attempt to solidify learning. VC required through rest of session to perform in other locations.   NMR performed for improvements in motor control  and coordination, balance, sequencing, judgement, and self confidence/ efficacy in performing all aspects of mobility at highest level of independence.   Patient supine in bed at end of session with brakes locked, bed alarm set, and all needs within reach.   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 Restrictions Weight Bearing Restrictions: No General:   Vital Signs:  Pain:    Therapy/Group: Individual Therapy  Alger Simons PT, DPT, CSRS 01/28/2022, 6:09 PM

## 2022-01-28 NOTE — Progress Notes (Signed)
Occupational Therapy Session Note  Patient Details  Name: Randy [name] MRN: [id number] Date of Birth: August 03, 1950  Today's Date: 01/28/2022 OT Individual Time: 1100-1130 OT Individual Time Calculation (min): 30 min    Short Term Goals: Week 1:  OT Short Term Goal 1 (Week 1): Pt will perform toilet transfer with LRAD with Supervision OT Short Term Goal 2 (Week 1): Pt will perform 3/3 toileting tasks with Supervision OT Short Term Goal 3 (Week 1): Pt will perform UB/LB dress with supervision using hemitechniques OT Short Term Goal 4 (Week 1): Pt will improve LUE to grasp/release and translate 4/4 items  Skilled Therapeutic Interventions/Progress Updates:    Upon OT arrival, pt seated in recliner reporting no pain. Pt agreeable to OT treatment. Treatment intervention with a focus on tub transfer training and functional mobility. Pt completes sit to stand transfer with Supervision and ambulates to rehab apartment to engage in tub transfer training. Therapist provided verbal and visual demonstration without and with TTB. Pt with greater difficulty clearing the L LE over edge of tub without TTB versus with TTB. Pt demonstrates safe transfer with TTB and pt was recommended to obtain a TTB prior to discharge for his home. Pt verbalizes understanding. Pt completes sit to stand transfer with Supervision and ambulates back to his room with RW and Supervision. Pt completes stand to sit transfer with Supervision and was left in recliner at end of session with all needs met and safety measures in place.   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 Restrictions Weight Bearing Restrictions: No    Therapy/Group: Individual Therapy  Marvetta Gibbons 01/28/2022, 11:26 AM

## 2022-01-28 NOTE — Progress Notes (Signed)
Patient ID: Randy [name], male   DOB: [birth year], 71 y.o.   MRN: [id number]  Letta Median, LCSW, CCM [facility] Rehab Coordinator  MSW Intern Coordinator   [facility] [place] [address] Phone: [phone] ext. [place]

## 2022-01-28 NOTE — IPOC Note (Signed)
Overall Plan of Care [facility]) Patient Details Name: Randy [name] MRN: [id number] DOB: 1950/10/29  Admitting Diagnosis: Right pontine cerebrovascular accident [facility])  Hospital Problems: Principal Problem:   Right pontine cerebrovascular accident [facility])     Functional Problem List: Nursing Medication Management, Safety, Endurance, Skin Integrity, Bowel  PT Balance, Behavior, Endurance, Motor, Safety  OT Balance, Cognition, Endurance, Motor, Perception, Safety, Sensory, Vision  SLP Cognition  TR         Basic ADL's: OT Eating, Grooming, Bathing, Dressing, Toileting     Advanced  ADL's: OT Simple Meal Preparation     Transfers: PT Bed Mobility, Bed to Chair, Car, [employer], Floor  OT Toilet, Metallurgist: PT Ambulation, Emergency planning/management officer, Stairs     Additional Impairments: OT Fuctional Use of Upper Extremity  SLP Communication, Social Cognition expression Social Interaction, Memory, Attention, Awareness, Problem Solving  TR      Anticipated Outcomes Item Anticipated Outcome  Self Feeding Independent  Swallowing  N/A   Basic self-care  Mod I - supervision  Toileting  Mod I   Bathroom Transfers Mod I  Bowel/Bladder  manage bowel  mod I assist  Transfers  mod I with LRAD  Locomotion  Mod I with LRAD ambulatory  Communication  Mod I  Cognition  Sup A  Pain  n/a  Safety/Judgment  manage w cues   Therapy Plan: PT Intensity: Minimum of 1-2 x/day ,45 to 90 minutes PT Frequency: 5 out of 7 days PT Duration Estimated Length of Stay: 9-12 days OT Intensity: Minimum of 1-2 x/day, 45 to 90 minutes OT Frequency: 5 out of 7 days OT Duration/Estimated Length of Stay: 10-12 days SLP Intensity: Minumum of 1-2 x/day, 30 to 90 minutes SLP Frequency: 3 to 5 out of 7 days SLP Duration/Estimated Length of Stay: 7-10 days   Team Interventions: Nursing Interventions Bowel Management, Patient/Family Education, Disease Management/Prevention, Medication  Management, Skin Care/Wound Management  PT interventions Ambulation/gait training, Functional mobility training, Discharge planning, Psychosocial support, Therapeutic Activities, Visual/perceptual remediation/compensation, Balance/vestibular training, Disease management/prevention, Neuromuscular re-education, Skin care/wound management, Wheelchair propulsion/positioning, Therapeutic Exercise, Cognitive remediation/compensation, DME/adaptive equipment instruction, Pain management, Splinting/orthotics, UE/LE Strength taining/ROM, Community reintegration, Technical sales engineer stimulation, Barrister's clerk education, IT trainer, UE/LE Coordination activities  OT Interventions Training and development officer, Disease mangement/prevention, Neuromuscular re-education, Self Care/advanced ADL retraining, Therapeutic Exercise, Wheelchair propulsion/positioning, Cognitive remediation/compensation, DME/adaptive equipment instruction, Skin care/wound managment, UE/LE Strength taining/ROM, Community reintegration, Functional electrical stimulation, Patient/family education, Splinting/orthotics, UE/LE Coordination activities, Visual/perceptual remediation/compensation, Therapeutic Activities, Psychosocial support, Functional mobility training, Discharge planning, Pain management  SLP Interventions Cognitive remediation/compensation, Patient/family education, Functional tasks  TR Interventions    SW/CM Interventions Discharge Planning, Psychosocial Support, Patient/Family Education, Disease Management/Prevention   Barriers to Discharge MD   Reduce fall risk given need to be alone ~12h per day  Nursing Decreased caregiver support, Home environment access/layout 1 level apt 12 ste w wife; works 6:30 p - 7a,  PT Inaccessible home environment, Decreased caregiver support, Home environment access/layout, Incontinence, Lack of/limited family support, Insurance underwriter for SNF coverage, Behavior    OT      SLP      SW Lack  of/limited family support, Decreased caregiver support, Tree surgeon     Team Discharge Planning: Destination: PT-Home ,OT- Home , SLP-Home Projected Follow-up: PT-Home health PT, Outpatient PT, OT-  Home health OT, SLP-24 hour supervision/assistance Projected Equipment Needs: PT-To be determined, OT- To be determined, SLP-None recommended by SLP Equipment Details: PT- , OT-  Patient/family involved in discharge  planning: PT- Patient,  OT-Patient, SLP-Patient  MD ELOS: 10-14d Medical Rehab Prognosis:  Good Assessment: The patient has been admitted for CIR therapies with the diagnosis of RIght pontine infarct. The team will be addressing functional mobility, strength, stamina, balance, safety, adaptive techniques and equipment, self-care, bowel and bladder mgt, patient and caregiver education, BP management . Goals have been set at Mod I. Anticipated discharge destination is Home.  See Team Conference Notes for weekly updates to the plan of care

## 2022-01-28 NOTE — Progress Notes (Signed)
Inpatient Rehabilitation Care Coordinator Assessment and Plan Patient Details  Name: Randy [name] MRN: [id number] Date of Birth: Dec 11, 1950  Today's Date: 01/28/2022  Hospital Problems: Principal Problem:   Right pontine cerebrovascular accident [facility])  Past Medical History:  Past Medical History:  Diagnosis Date   Angio-edema    Blepharospasm    BPH (benign prostatic hyperplasia)    Cervical myelopathy ([place])    Glaucoma 2021   Hyperlipidemia    Nephrolithiasis    Optic neuropathy    Right   Urticaria    Past Surgical History:  Past Surgical History:  Procedure Laterality Date   NECK SURGERY  2015   TONSILLECTOMY     Social History:  reports that he has been smoking cigarettes. He has been smoking an average of 1 pack per day. He has never used smokeless tobacco. He reports current alcohol use. He reports that he does not use drugs.  Family / Support Systems Spouse/Significant Other: [name] Anticipated Caregiver: spouse Ability/Limitations of Caregiver: spouse works at [employer], civil (consulting): Intermittent Family Dynamics: support from spouse  Social History Preferred language: English Religion: [employer] - How often do you need to have someone help you when you read instructions, pamphlets, or other written material from your doctor or pharmacy?: Never Writes: Yes   Abuse/Neglect Abuse/Neglect Assessment Can Be Completed: Yes Physical Abuse: Denies Verbal Abuse: Denies Sexual Abuse: Denies Exploitation of patient/patient's resources: Denies Self-Neglect: Denies  Patient response to: Social Isolation - How often do you feel lonely or isolated from those around you?: Never  Emotional Status Recent Psychosocial Issues: coping Substance Abuse History: alcohol and tobacco  Patient / Family Perceptions, Expectations & Goals Pt/Family understanding of illness & functional limitations: yes Premorbid pt/family roles/activities: Independent  without ADLS and driving Anticipated changes in roles/activities/participation: anticipating MOD I goals due to spouse working at night Pt/family expectations/goals: [place]: None Premorbid Home Care/DME Agencies: Other (Comment) ([facility]) Transportation available at discharge: spouse Is the patient able to respond to transportation needs?: Yes In the past 12 months, has lack of transportation kept you from medical appointments or from getting medications?: No In the past 12 months, has lack of transportation kept you from meetings, work, or from getting things needed for daily living?: No Resource referrals recommended: Neuropsychology  Discharge Planning Living Arrangements: Spouse/significant other Support Systems: Spouse/significant other Type of Residence: Private residence Insurance Resources: Multimedia programmer (specify) ([facility]) Financial Resources: Family Support, SSD Financial Screen Referred: No Living Expenses: Own Money Management: Patient Does the patient have any problems obtaining your medications?: No Home Management: Independent Patient/Family Preliminary Plans: Anticipates to manage at d/c Care Coordinator Barriers to Discharge: Lack of/limited family support, Decreased caregiver support, Home environment access/layout Care Coordinator Anticipated Follow Up Needs: HH/OP Expected length of stay: 7-10 Days  Clinical Impression SW met with patient, introduced self and explained role.Patient anticipates discharging home with spouse to provide intermittent assistance (works at night). No additional questions or concerns, SW will continue to follow up.  Dyanne Iha 01/28/2022, 12:24 PM

## 2022-01-28 NOTE — Progress Notes (Signed)
Occupational Therapy Session Note  Patient Details  Name: Randy [name] MRN: [id number] Date of Birth: Jan 30, 1951  Today's Date: 01/28/2022 OT Individual Time: 6440-3474 OT Individual Time Calculation (min): 65 min    Short Term Goals: Week 1:  OT Short Term Goal 1 (Week 1): Pt will perform toilet transfer with LRAD with Supervision OT Short Term Goal 2 (Week 1): Pt will perform 3/3 toileting tasks with Supervision OT Short Term Goal 3 (Week 1): Pt will perform UB/LB dress with supervision using hemitechniques OT Short Term Goal 4 (Week 1): Pt will improve LUE to grasp/release and translate 4/4 items  Skilled Therapeutic Interventions/Progress Updates:    Pt received in bed dressed. He declined a shower and stated all he needed to do is don a new shirt and shoes. Pt had thigh high TEDS, he has not had low BP so obtained knee highs for pt.  Assisted pt with donning knee highs and then pt donned socks and shoes including tying them without A.  Pt worked on sit to stands from bed without use of RW. Pt then ambulated from room to day room with RW with slightly hunched posture so increased height of RW.  Pt stated that he always used to walk with a "strut" in which he hunched forward slightly.  Encouraged him to use as much upright posture as possible for improved LLE strength.    In gym pt sat on mat and worked on L shoulder strength using 3 # dowel bar for chest presses with less than 80 of sh flexion.  2# dowel for one handed pronation and supination.  Standing with yoga block in hands and performing cross chops to each side 12x for dynamic balance. Standing with R hand on side of walker for slight support as he practiced tapping L foot floor to yoga block, forward and back and knee lifts to 90 hip flex to prepare for stair training later.   Pt is using LUE actively in therapy and ADLs. He would also benefit from sh wall slides in later OT session.    Pt ambulated back to room to prepare for  his next therapy session. SLP arrived for his next session.   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 Restrictions Weight Bearing Restrictions: No   Pain: Pain Assessment Pain Scale: 0-10 Pain Score: 0-No pain ADL: ADL Eating: Set up Grooming: Contact guard Where Assessed-Grooming: Standing at sink Upper Body Bathing: Contact guard Where Assessed-Upper Body Bathing: Shower Lower Body Bathing: Contact guard Where Assessed-Lower Body Bathing: Shower Upper Body Dressing: Setup Lower Body Dressing: Minimal assistance (to pull up pants over L hip) Toileting: Minimal assistance (to pull up pants over L hip) Where Assessed-Toileting: Glass blower/designer: Therapist, music Method: Counselling psychologist: Energy manager: Curator Method: Heritage manager: Radio broadcast assistant   Therapy/Group: Individual Therapy  [place] 01/28/2022, 8:22 AM

## 2022-01-29 DIAGNOSIS — I6350 Cerebral infarction due to unspecified occlusion or stenosis of unspecified cerebral artery: Secondary | ICD-10-CM | POA: Diagnosis not present

## 2022-01-29 MED ORDER — CETAPHIL MOISTURIZING EX LOTN: TOPICAL_LOTION | CUTANEOUS | Status: DC | PRN

## 2022-01-29 NOTE — Progress Notes (Signed)
PROGRESS NOTE   Subjective/Complaints:  Reviewed bloodwork , normal , some left knee pain but no other c/os.  "Old football injury" no recent falls   ROS:  Pt denies SOB, abd pain, CP, N/V/C/D, and vision changes   Objective:   No results found. Recent Labs    01/28/22 0509  WBC 4.8  HGB 15.1  HCT 44.8  PLT 181    Recent Labs    01/28/22 0509  NA 138  K 4.3  CL 104  CO2 26  GLUCOSE 127*  BUN 12  CREATININE 0.96  CALCIUM 9.6     Intake/Output Summary (Last 24 hours) at 01/29/2022 0804 Last data filed at 01/29/2022 0729 Gross per 24 hour  Intake 1214 ml  Output 700 ml  Net 514 ml         Physical Exam: Vital Signs Blood pressure (!) 150/90, pulse (!) 57, temperature 98 F (36.7 C), resp. rate 17, height 5\' 10"  (1.778 m), weight 98.2 kg, SpO2 99 %.   General: No acute distress Mood and affect are appropriate Heart: Regular rate and rhythm no rubs murmurs or extra sounds Lungs: Clear to auscultation, breathing unlabored, no rales or wheezes Abdomen: Positive bowel sounds, soft nontender to palpation, nondistended Extremities: No clubbing, cyanosis, or edema   MSK:     No pain with Left knee ROM, no effusion or jt deformity no pain to palp in  popliteal area, over quad and patellar tendons or along joint lines       Strength:           RUE and RLE 5/5 Left UE 4+, LLE 4+    Neurologic exam:  Cognition: AAO to person, place, time and event.  Language: Fluent, No substitutions or neoglisms. Mild dysarthria.  Insight: Good insight into current condition.  Mood: Pleasant affect, appropriate mood.  Sensation: To light touch intact in BL UEs and LEs   Spasticity: MAS 0 in all extremities.   Assessment/Plan: 1. Functional deficits which require 3+ hours per day of interdisciplinary therapy in a comprehensive inpatient rehab setting. Physiatrist is providing close team supervision and 24 hour  management of active medical problems listed below. Physiatrist and rehab team continue to assess barriers to discharge/monitor patient progress toward functional and medical goals  Care Tool:  Bathing    Body parts bathed by patient: Right arm, Left arm, Chest, Abdomen, Front perineal area, Right upper leg, Left upper leg, Face   Body parts bathed by helper: Buttocks, Right lower leg, Left lower leg     Bathing assist Assist Level: Moderate Assistance - Patient 50 - 74%     Upper Body Dressing/Undressing Upper body dressing   What is the patient wearing?: Pull over shirt    Upper body assist Assist Level: Minimal Assistance - Patient > 75%    Lower Body Dressing/Undressing Lower body dressing      What is the patient wearing?: Pants     Lower body assist Assist for lower body dressing: Moderate Assistance - Patient 50 - 74%     Toileting Toileting    Toileting assist Assist for toileting: Minimal Assistance - Patient > 75%  Transfers Chair/bed transfer  Transfers assist     Chair/bed transfer assist level: Minimal Assistance - Patient > 75%     Locomotion Ambulation   Ambulation assist      Assist level: Minimal Assistance - Patient > 75% Assistive device: No Device Max distance: 176ft   Walk 10 feet activity   Assist     Assist level: Minimal Assistance - Patient > 75% Assistive device: No Device   Walk 50 feet activity   Assist    Assist level: Minimal Assistance - Patient > 75% Assistive device: No Device    Walk 150 feet activity   Assist    Assist level: Minimal Assistance - Patient > 75% Assistive device: No Device    Walk 10 feet on uneven surface  activity   Assist     Assist level: Minimal Assistance - Patient > 75% Assistive device: Other (comment) (rail up ramp)   Wheelchair     Assist Is the patient using a wheelchair?: Yes Type of Wheelchair: Manual    Wheelchair assist level: Minimal Assistance -  Patient > 75% Max wheelchair distance: 126ft    Wheelchair 50 feet with 2 turns activity    Assist        Assist Level: Minimal Assistance - Patient > 75%   Wheelchair 150 feet activity     Assist      Assist Level: Minimal Assistance - Patient > 75%   Blood pressure (!) 150/90, pulse (!) 57, temperature 98 F (36.7 C), resp. rate 17, height 5\' 10"  (1.778 m), weight 98.2 kg, SpO2 99 %.  Medical Problem List and Plan: 1. 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  Plan 30-day cardiac event monitor on discharge             -patient may shower             -ELOS/Goals: 10-14 days, Mod I PT/OT/SLP team conf in am              - Primary deficits RUE > RLE hemiparesis and vertigo, worse when laying down   Con't CIR- - PT/OT and SLP, team conf in am  2.  Antithrombotics: -DVT/anticoagulation:  Pharmaceutical: Heparin             -antiplatelet therapy: Aspirin 81 mg daily and Plavix 75 mg day x3 weeks then Plavix alone (per neurology consult note; DC summary lists as continue ASA alone).    3. Pain Management: Tylenol PRN, Oxycodone 5 mg every 6 hours as needed     10/21- denies pain- con't regimen prn 4. Mood/Behavior/Sleep: Cymbalta 30 mg daily, Paxil 5 mg nightly.  Provide emotional support             -antipsychotic agents: N/A   5. Neuropsych/cognition: This patient is capable of making decisions on his own behalf.   6. Skin/Wound Care: Routine skin checks   7. Fluids/Electrolytes/Nutrition: Routine in and outs with follow-up chemistries.   8.  Permissive hypertension (up to 220/110, normotension goal at 5-7 days).  Coreg 12.5 mg twice daily, lisinopril 20 mg daily.  Monitor with increased mobility    Vitals:   01/28/22 2006 01/29/22 0502  BP: (!) 150/72 (!) 150/90  Pulse: (!) 58 (!) 57  Resp: 18 17  Temp: (!) 96.3 F (35.7 C) 98 F (36.7 C)  SpO2: 98% 99%  Controlled , has bradycardia   9.  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  Monitor for any  signs of fluid overload.  Follow-up outpatient cardiology services   10.  History of alcohol and tobacco use (half ppd).  NicoDerm patch.  Provide counseling.  Monitor for any signs of withdrawal.               - + chronic cough, likely underlying chronic lung dz; add PRN duonebs   10/21- no cough heard this AM-con't regimen 11.  Hyperlipidemia.  Lipitor   12.  BPH.  Check PVR.  Patient on Hytrin 4 mg nightly prior to admission held for permissive hypertension.  Resume as needed   13. Rosacea. Takes OTC cream at home; advised wife bring in and can add do med list.    14. Constipation. Having 2x BM yesterday but endorsing hard/difficult to pass stools.         - Start Miralax daily + Sennakot-S PRN   10/21- LBM yesterday- con't to monitor  15. OSA: noncompliant with home CPAP. Monitor vitals.  16. Hx of blepharospasm , receives botox injections   LOS: 4 days A FACE TO FACE EVALUATION WAS PERFORMED  Erick Colace 01/29/2022, 8:04 AM

## 2022-01-29 NOTE — Progress Notes (Signed)
Patient appears restful on rounding throughout this shift in no acute distress or discomfort, respiration unlabored on room air, coloration adequate. Tylenol 650 mg administered x1 for left thigh and knee discomfort. Continue regime, call bell within reach, bed alarm on. Monitor and assisted.

## 2022-01-29 NOTE — Progress Notes (Signed)
Occupational Therapy Session Note  Patient Details  Name: Randy [name] MRN: [id number] Date of Birth: 1951-03-10  Today's Date: 01/29/2022 OT Individual Time: 7425-9563 OT Individual Time Calculation (min): 72 min    Short Term Goals: Week 1:  OT Short Term Goal 1 (Week 1): Pt will perform toilet transfer with LRAD with Supervision OT Short Term Goal 2 (Week 1): Pt will perform 3/3 toileting tasks with Supervision OT Short Term Goal 3 (Week 1): Pt will perform UB/LB dress with supervision using hemitechniques OT Short Term Goal 4 (Week 1): Pt will improve LUE to grasp/release and translate 4/4 items  Skilled Therapeutic Interventions/Progress Updates:  Pt greeted seated in recliner, pt agreeable to OT intervention. Discussed how to don TEDs at home independently, demonstrated various AE for pt with pt doing best with corner of plastic bag method to decrease friction and using figure four position. Pt able to don shoes with set- up. Pt completed ambulatory toilet transfer with Rw and supervision, pt completed 3/3 toileting tasks with supervision. Pt completed standing hand hygiene at sink with supervision. Pt completed functional ambulation down to ADL apt with supervision. Discussed bed mobility for home, pt reports he has tall bed at home. Education provided on using aerobic step to step backwards onto step with support of Rw and then sit on bed. Pt able to demo ability to step onto step with Rw and CGA. Pt receptive to education but reports wanting to try without the step first so that he doesn't have to buy another piece of equipment.    Briefly discussed kitchen mobility with pt reporting he really only goes into kitchen to retrieve snacks, education provided on placing items within pt reach for energy conservation.  Pt completed functional mobility to gym with supervision with Rw.      The Dynamometer Grip Strength Test is a quantitative and objective measure of isometric muscular  strength of the hand and forearm.  -Instructions The patient was asked to sit with their back, pelvis, and knees at 90 degrees. The shoulder was adducted and neutrally rotated with The elbow flexed to 90 degrees and forearm in neutral. The arm was not supported.  -Results The score was determined by calculating the average of 3 trials. The pt's average score was  64 lbs in the R hand and  51 lbs in the L hand.  -Norms Males Average in lbs 55-59 R 101.1 L 83.2 60-64 R 89.7 L 76.8 65-69 R 91.1 L 76.8 70-74 R 75.3 L 64.8 75+ R 65.7 L 55.0  Education provided on importance of continuing to use theraputty to work on functional grip strength.  Pt left seated EOB with all needs within reach.    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 Restrictions Weight Bearing Restrictions: No  Pain: No pain    Therapy/Group: Individual Therapy  Randy [name] 01/29/2022, 12:18 PM

## 2022-01-29 NOTE — Consult Note (Signed)
Neuropsychological Consultation   Patient:   Randy [name]   DOB:   1951/02/06  MR Number:  [id number]  Location:  [facility] [address] CENTER A [address] [id number] [place] [address] Dept: [phone] Loc: [phone]           Date of Service:   01/29/2022  Start Time:   1:30 PM End Time:   2:30 PM  Provider/Observer:  Arley Phenix, Psy.D.       Clinical Neuropsychologist       Billing Code/Service: [phone]  Chief Complaint:    Randy [name] is a 71 year old male past medical history including BPH, glaucoma, systolic and diastolic heart failure, hyperlipidemia, hypertension, alcohol as well as tobacco use.  Patient also reported that he had not been particularly diligent in taking his blood pressure medicines recently.  Patient was referred for neuropsychological consultation due to residual effects of his stroke impacting paramedian pontine region measuring 9 mm and has impact resulting in left-sided motor weakness, slurred speech necessitating comprehensive inpatient rehabilitation program admission.  Patient had presented to [facility] on 01/20/2022 with acute onset of left-sided weakness/dizziness and slurred speech.  Patient reports that he had been brushing his teeth and noted that he had to hold onto the sink for stability and when he went to spit that he lost his balance and fell over striking his head on the wall but mostly impacting his head into the toilet paper dispenser.  Patient reports that he was aware at this time and wife came in noting him on the ground and asking what it happened.  EMS was called and patient presented to [facility].  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  There was moderate generalized atrophy noted on MRI.  Moderate to high-grade stenosis of proximal M3 segment of the right superior division was noted.  Patient did not  receive tPA.  Patient has continued with left-sided weakness and slurred speech although both of which have been improving recently.  Patient was admitted to comprehensive inpatient rehabilitation program.  Reason for Service:  Patient was referred for neuropsychological consultation due to coping and adjustment with residual effects of his brainstem stroke and its impact on motor functioning.  Below is the HPI for the current admission.  HPI: Randy [name] is a 71 year old right-handed male with history significant for BPH, glaucoma, systolic and diastolic heart failure, hyperlipidemia, hypertension, alcohol as well as tobacco use.  Per chart review patient lives with spouse.  1 level home multiple steps to entry.  Independent prior to admission and driving.  Presented to [facility] 01/20/2022 with acute onset of left-sided weakness/dizziness and slurred speech.  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  Moderate generalized atrophy.  MRI of the head and neck moderate to high-grade stenosis proximal M3 segments of the right superior division.  No significant proximal stenosis aneurysm or branch vessel occlusion in the circle of Willis.  Patient did not receive tPA.  Admission chemistries unremarkable except glucose 149, hemoglobin A1c 5.8, alcohol negative, urine drug screen negative.  Carotid ultrasound with no stenosis.  Echocardiogram with ejection fraction of 40 to 45%.  Left ventricle demonstrating global hypokinesis as well as grade 1 diastolic dysfunction.  Neurology follow-up placed on aspirin 81 mg daily and Plavix 75 mg daily x3 weeks then Plavix alone as documented by neurology services 01/21/2022.  Subcutaneous heparin for DVT prophylaxis.  Permissive  hypertension and monitored.  Tolerating a regular diet.  Therapy evaluations completed due to patient's left-sided weakness and slurred speech was admitted for a comprehensive rehab  program.  Current Status:  Patient was awake and alert sitting slightly elevated in his bed when I entered the room.  Patient did show movement in all limbs but reported that he has continued motor deficits although he has been able to stand at the sink and do walking with assistance and feels like he is making some nice progress through his rehabilitative efforts.  The patient denied any significant depressive or anxiety type symptoms beyond what would be expected with his recent stroke and hospitalization.  Patient was aware of his less than complete compliance with blood pressure medicines prior to his stroke.  Patient reports that his speech has improved significantly and he feels like he is increasingly sounding like himself when he talks.  Patient denied significant emotional disturbance although it has been a challenge for him worried about how much she will recover and impact on his life going forward.  Behavioral Observation: [name]  presents as a 71 y.o.-year-old Right handed African American Male who appeared his stated age. his dress was Appropriate and he was Well Groomed and his manners were Appropriate to the situation.  his participation was indicative of Appropriate and Redirectable behaviors.  There were physical disabilities noted.  he displayed an appropriate level of cooperation and motivation.     Interactions:    Active Appropriate  Attention:   within normal limits and attention span and concentration were age appropriate  Memory:   within normal limits; recent and remote memory intact  Visuo-spatial:  not examined  Speech (Volume):  normal  Speech:   normal; slurred  Thought Process:  Coherent and Relevant  Though Content:  WNL; not suicidal and not homicidal  Orientation:   person, place, time/date, and situation  Judgment:   Fair  Planning:   Fair  Affect:    Appropriate  Mood:    Dysphoric  Insight:   Good  Intelligence:   normal   Medical  History:   Past Medical History:  Diagnosis Date   Angio-edema    Blepharospasm    BPH (benign prostatic hyperplasia)    Cervical myelopathy (HCC)    Glaucoma 2021   Hyperlipidemia    Nephrolithiasis    Optic neuropathy    Right   Urticaria          Patient Active Problem List   Diagnosis Date Noted   Right pontine cerebrovascular accident (HCC) 01/25/2022   CVA (cerebral vascular accident) (HCC) 01/20/2022   HTN (hypertension) 01/20/2022   Tobacco use 01/20/2022   ETOH abuse 01/20/2022   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  Family Med/Psych History:  Family History  Problem Relation Age of Onset   Heart attack Father    Dementia Sister    Arthritis Sister    Breast cancer Sister    Healthy Son    Healthy Daughter    Allergic rhinitis Neg Hx    Angioedema Neg Hx    Asthma Neg Hx    Atopy Neg Hx  Eczema Neg Hx    Immunodeficiency Neg Hx    Urticaria Neg Hx     Impression/DX:  Adien Kimmel is a 71 year old male past medical history including BPH, glaucoma, systolic and diastolic heart failure, hyperlipidemia, hypertension, alcohol as well as tobacco use.  Patient also reported that he had not been particularly diligent in taking his blood pressure medicines recently.  Patient was referred for neuropsychological consultation due to residual effects of his stroke impacting paramedian pontine region measuring 9 mm and has impact resulting in left-sided motor weakness, slurred speech necessitating comprehensive inpatient rehabilitation program admission.  Patient had presented to [facility] on 01/20/2022 with acute  onset of left-sided weakness/dizziness and slurred speech.  Patient reports that he had been brushing his teeth and noted that he had to hold onto the sink for stability and when he went to spit that he lost his balance and fell over striking his head on the wall but mostly impacting his head into the toilet paper dispenser.  Patient reports that he was aware at this time and wife came in noting him on the ground and asking what it happened.  EMS was called and patient presented to [facility].  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  There was moderate generalized atrophy noted on MRI.  Moderate to high-grade stenosis of proximal M3 segment of the right superior division was noted.  Patient did not receive tPA.  Patient has continued with left-sided weakness and slurred speech although both of which have been improving recently.  Patient was admitted to comprehensive inpatient rehabilitation program.  Patient was awake and alert sitting slightly elevated in his bed when I entered the room.  Patient did show movement in all limbs but reported that he has continued motor deficits although he has been able to stand at the sink and do walking with assistance and feels like he is making some nice progress through his rehabilitative efforts.  The patient denied any significant depressive or anxiety type symptoms beyond what would be expected with his recent stroke and hospitalization.  Patient was aware of his less than complete compliance with blood pressure medicines prior to his stroke.  Patient reports that his speech has improved significantly and he feels like he is increasingly sounding like himself when he talks.  Patient denied significant emotional disturbance although it has been a challenge for him worried about how much she will recover and impact on his life going forward.  Disposition/Plan:  Today we worked on coping and adjustment issues dealing with  residual effects of his motor function loss and expectations going forward.  Diagnosis:    Right pontine cerebrovascular accident [facility]) - Plan: Ambulatory referral to Neurology         Electronically Signed   _______________________ Ilean Skill, Psy.D. Clinical Neuropsychologist

## 2022-01-29 NOTE — Progress Notes (Signed)
Physical Therapy Session Note  Patient Details  Name: Randy [name] MRN: [id number] Date of Birth: 06-30-1950  {CHL IP REHAB PT TIME CALCULATION:304800500}  Short Term Goals: {BJY:7829562}  Skilled Therapeutic Interventions/Progress Updates:  Patient seated on EOB with MD present on entrance to room. Patient alert and agreeable to PT session.   Patient with no pain complaint at start of session. Requests to shower prior to dressing for day.   Therapeutic Activity: Bed Mobility: Pt performed supine <> sit with ***. VC/ tc required for ***. Transfers: Pt performed sit<>stand  from EOB with several tries and reach out for balance at tray table. Ambulatory transfer into bathroom and furniture walking with CGA. VC for upright posture and focus on LLE with no UE support. Pt states that he is just used to reaching out to steady self.   and stand pivot transfers throughout session with ***. Provided verbal cues for***.  Gait Training:  Pt ambulated *** ft using *** with ***. Demonstrated ***. Provided vc/ tc for ***.  Neuromuscular Re-ed: NMR facilitated during session with focus on***. Pt guided in ***. NMR performed for improvements in motor control and coordination, balance, sequencing, judgement, and self confidence/ efficacy in performing all aspects of mobility at highest level of independence.   Therapeutic Exercise: Pt performed the following exercises with vc/ tc for proper technique. ***  Patient *** at end of session with brakes locked, *** alarm set, and all needs within reach.   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 Restrictions Weight Bearing Restrictions: No General:   Vital Signs: Therapy Vitals Temp: 98 F (36.7 C) Pulse Rate: (!) 57 Resp: 17 BP: (!) 150/90 Patient Position (if appropriate): Lying Oxygen Therapy SpO2: 99 % O2 Device: Room Air Pain:   Mobility:   Locomotion :     Trunk/Postural Assessment :    Balance:   Exercises:   Other Treatments:      Therapy/Group: {Therapy/Group:[id number]}  Alger Simons 01/29/2022, 7:52 AM

## 2022-01-29 NOTE — Progress Notes (Signed)
Speech Language Pathology Daily Session Note  Patient Details  Name: Randy [name] MRN: [id number] Date of Birth: 10/23/1950  Today's Date: 01/29/2022 SLP Individual Time: 1440-1535 SLP Individual Time Calculation (min): 55 min  Short Term Goals: Week 1: SLP Short Term Goal 1 (Week 1): STG's = LTG's due to ELOS  Skilled Therapeutic Interventions: Skilled ST treatment focused on cognitive goals. Pt greeted EOB and agreeable to ST intervention. Pt ambulated to speech therapy room using RW and CGA. SLP facilitated session by administering various subtests of the ALFA. Pt completed "solving daily math problems" by achieving 9/10 correct at independent level progressing to 10/10 with verbal cue to correct error x1. Pt completed "counting money" subtest by achieving 10/10 at independent level and completed task in a timely and efficient manner. SLP then facilitated a complex problem solving, deductive reasoning, and organization task in which the patient prioritized information in order to generate a daily schedule when given a list of appointments with various time constraints. Pt completed task with mod I to achieve 95% accuracy, and was able to self monitor/correct and repair 1/2 errors at independent level. Pt feels he is at his cognitive baseline. Pt also feels his speech is at baseline. Pt was perceived as fluent and intelligible at the conversation level with only minimal instances of articulatory imprecision in which pt compensated and self corrected. Anticipate additional SLP tx session x1 for high level cognitive tasks, follow up education as needed, etc. prior to discharging from SLP services considering pt is currently completing complex cognitive tasks at the mod I level. Pt verbalized understanding and agreement. Patient was left in bed with alarm activated and immediate needs within reach at end of session. Continue per current plan of care.      Pain  None/denied  Therapy/Group:  Individual Therapy  Patty Sermons 01/29/2022, 2:49 PM

## 2022-01-30 DIAGNOSIS — I6350 Cerebral infarction due to unspecified occlusion or stenosis of unspecified cerebral artery: Secondary | ICD-10-CM | POA: Diagnosis not present

## 2022-01-30 NOTE — Progress Notes (Signed)
Speech Language Pathology Discharge Summary  Patient Details  Name: Randy [name] MRN: [id number] Date of Birth: [birth year]  Date of Discharge from [facility] 25, 2023  Today's Date: 01/30/2022 SLP Individual Time: 0623-7628 SLP Individual Time Calculation (min): 59 min  Skilled Therapeutic Interventions:  Skilled ST treatment focused on cognitive goals. Slp facilitated session by re-administering the SLUMS to evaluate pt's cognition. Pt obtained a 25/30, which is a 7 point improvement from initial evaluation obtained on 01/26/22. Pt exhibited difficulty with story recall components, as well as calculations. Improvements were noted with visuospatial skills and executive functioning. Pt again reports he feels he is at cognitive baseline at this time. SLP facilitated a complex scheduling task in which the patient prioritized information in order to generate a delivery schedule. Pt completed task at the mod I level for accuracy. Mid session, pt received a call from the [facility] him of an appointment scheduled for this coming Friday. Pt independently identified that he will need to reschedule the appointment, called the [facility], and dialed appropriate extension given list of options where he was placed on hold for the remainder of the session. While on hold, pt returned to scheduling task and required sup A verbal cues and extended processing time to divide attention between hold music and cognitive task. SLP reinforced importance of completing iADLs and other cognitive related tasks with decreased distractions to maximize attention. Pt in agreement and stated it is typically challenging to "block out" noise at prior level. Pt was also observed independently writing down pertinent information such as appropriate call back number and appointment dates on a piece of paper as a compensatory memory aid. Considering pt is at mod I level and likely near cognitive baseline, it appears clinically  appropriate to discharge from SLP services at this time. Pt in agreement. SLP plans to sign off at this time. Follow up services do not appear clinically indicated. Patient was left in recliner with alarm activated and immediate needs within reach at end of session.   Patient has met 2 of 2 long term goals.  Patient to discharge at overall Modified Independent level.  Reasons goals not met: All goals met   Clinical Impression/Discharge Summary:   Patient has made excellent gains and has met 2 of 2 long-term goals this admission. Patient is currently completing functional and complex cognitive tasks at the mod I level in regards to problem solving, executive functions, and anticipatory awareness. Pt is also mod I for implementation of speech intelligibility strategies and is perceived as fluent and intelligible at the conversation level. Pt feels he is at cognitive and speech baseline. Patient education is complete and patient to discharge at overall mod I level. Patient's care partner is independent to provide the necessary physical and cognitive assistance at discharge. Follow up SLP services do not appear clinically indicated at this time.   Care Partner:  Caregiver Able to Provide Assistance: Yes  Type of Caregiver Assistance: Cognitive;Physical  Recommendation:  None     Equipment: None   Reasons for discharge: Treatment goals met   Patient/Family Agrees with Progress Made and Goals Achieved: Yes    Shanice Poznanski T Adine Heimann 01/30/2022, 4:57 PM

## 2022-01-30 NOTE — Plan of Care (Signed)
  Problem: RH Expression Communication Goal: LTG Patient will increase speech intelligibility (SLP) Description: LTG: Patient will increase speech intelligibility at word/phrase/conversation level with cues, % of the time (SLP) Outcome: Completed/Met   Problem: RH Awareness Goal: LTG: Patient will demonstrate awareness during functional activites type of (SLP) Description: LTG: Patient will demonstrate awareness during functional activites type of (SLP) Outcome: Completed/Met

## 2022-01-30 NOTE — Progress Notes (Addendum)
Physical Therapy Session Note  Patient Details  Name: Randy [name] MRN: [id number] Date of Birth: [birth year]  Today's Date: 01/30/2022 PT Individual Time: 1310-1405 PT Individual Time Calculation (min): 55 min   Short Term Goals: Week 1:  PT Short Term Goal 1 (Week 1): Pt will perform all transfers with superision assist PT Short Term Goal 2 (Week 1): Pt will improve Berg balance score >45 to indicate reduced fall risk PT Short Term Goal 3 (Week 1): Pt will ambulate 136f with supersion assist with LRAD  Skilled Therapeutic Interventions/Progress Updates:   Patient supine in bed with nurse present on entrance. Patient alert and agreeable to PT session.  Patient with complaint of sight left leg pain at start of session but insisted that he is good for treatment session.  Berg Balance: Patient demonstrates increased fall risk as noted by score of  47/56 on Berg Balance Scale.  (<36= high risk for falls, close to 100%; 37-45 significant >80%; 46-51 moderate >50%; 52-55 lower >25%).  5x STS: 9.28 seconds, performed with arms across chest.  Therapeutic Activity: Bed mobility: Pt performed supine to sit with supervision.  Transfers: Pt performed sit<> stand from EOB with supervision and without verbal or tactile cuing.  Gait Training: Pt ambulated to gym using a RW with supervision. Transitioned to a rollator and ambulated 150 ft with with CGA initially but transitioned to modified independent after 225f Provided verbal and tactile cuing to maintain upright posture and avoiding left foot drag. Switched to a single point cane after patient showed increase confidence and improved balance while using rollator. Used bariatric single point cane during treatment session, was unable to locate standard single point cane. Pt ambulated an additional 150 ft with CGA while using single point cane. Pt mentioned that he already has single point cane at home.  Neuromuscular Re-ed: NMR facilitated  during session with focus on gait sequencing, balance, and lower extremity coordination. Activities below were performed using a single point cane, with verbal & tactile cuing, and  with CGA to Min A.  Stepping over obstacles (x3): Performed using a single point cane. Pt required verbal cuing for foot positioning and for maintaining  upright posture. Performed with CGA.  Stepping around obstacle (x3):  Performed using a single point cane. Pt required verbal and tactile cuing for maintaining upright posture. Performed with CGA.  Alt foot taps on box step, 6in (3x8): Performed at the parallel bars. Pt required verbal and tactile cuing for foot placement, sequencing, and control. Performed with Min A. Pt struggled with left foot taps, cued for softer foot landing.  Box step ups, 6in (2x8)  Performed at the parallel bars. Pt required verbal and tactile cuing for foot placement and sequencing. Performed with Min A.   Single leg balance with SPC (3x10 sec ea): Pt required verbal and tactile cuing for cane placement, and maintaining postural balance. Performed with CGA.   Gait ambulation with horizontal head turns for 15 ft, (x3): Performed using a single point cane. Pt had visual stimulus on walls either side of him. Required verbal cuing for posture and avoiding foot drag. Performed with CGA.   Pt in recliner with belt alarm on and all needs met.      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 Restrictions Weight Bearing Restrictions: No    Vital Signs: Therapy Vitals Temp: 98.3 F (36.8 C) Temp Source: Oral Pulse Rate: (!) 57 Resp: 18 BP: 125/68 Patient Position (  if appropriate): Lying Oxygen Therapy SpO2: 95 % O2 Device: Room Air Pain: Pain Assessment Pain Scale: 0-10 Pain Score: 3  Pain Type: Acute pain Pain Location: Leg Pain Orientation: Left Pain Descriptors / Indicators: Dull Pain Onset: With  Activity Patients Stated Pain Goal: 2 Pain Intervention(s): Medication (See eMAR) Multiple Pain Sites: No     Balance: Standardized Balance Assessment Standardized Balance Assessment: (P) Berg Balance Test Berg Balance Test Sit to Stand: (P) Able to stand without using hands and stabilize independently Standing Unsupported: (P) Able to stand safely 2 minutes Sitting with Back Unsupported but Feet Supported on Floor or Stool: (P) Able to sit safely and securely 2 minutes Stand to Sit: (P) Sits safely with minimal use of hands Transfers: (P) Able to transfer safely, minor use of hands Standing Unsupported with Eyes Closed: (P) Able to stand 10 seconds with supervision Standing Ubsupported with Feet Together: (P) Able to place feet together independently and stand 1 minute safely From Standing, Reach Forward with Outstretched Arm: (P) Can reach forward >12 cm safely (5") From Standing Position, Pick up Object from Floor: (P) Able to pick up shoe, needs supervision From Standing Position, Turn to Look Behind Over each Shoulder: (P) Turn sideways only but maintains balance Turn 360 Degrees: (P) Able to turn 360 degrees safely in 4 seconds or less Standing Unsupported, Alternately Place Feet on Step/Stool: (P) Able to stand independently and safely and complete 8 steps in 20 seconds Standing Unsupported, One Foot in Front: (P) Able to plae foot ahead of the other independently and hold 30 seconds Standing on One Leg: (P) Tries to lift leg/unable to hold 3 seconds but remains standing independently Total Score: (P) 47  Therapy/Group: Individual Therapy  Hilary Hertz PT, SPT   Hilary Hertz 01/30/2022, 3:42 PM    I attest that I was present and this session was performed under the supervision of a licensed clinician, and that the documentation accurately reflects treatment performed.   Barrie Folk PT, DPT   5:38 PM 01/30/22

## 2022-01-30 NOTE — Patient Care Conference (Signed)
Inpatient RehabilitationTeam Conference and Plan of Care Update Date: 01/30/2022   Time: 10:18 AM    Patient Name: Randy [name]      Medical Record Number: [id number]  Date of Birth: December 11, 1950 Sex: Male         Room/Bed: 4W05C/4W05C-01 Payor Info: Payor: [employer] / Plan: [place] / Product Type: *No Product type* /    Admit Date/Time:  01/25/2022 12:40 PM  Primary Diagnosis:  Right pontine cerebrovascular accident [facility])  Hospital Problems: Principal Problem:   Right pontine cerebrovascular accident [facility])    Expected Discharge Date: Expected Discharge Date: 02/05/22  Team Members Present: Physician leading conference: Dr. Alysia Penna Social Worker Present: Erlene Quan, BSW Nurse Present: Dorien Chihuahua, RN PT Present: Barrie Folk, PT OT Present: Meriel Pica, OT SLP Present: Sherren Kerns, SLP PPS Coordinator present : Gunnar Fusi, SLP     Current Status/Progress Goal Weekly Team Focus  Bowel/Bladder   Patient is continent of B/B,LBM 01/29/22  Maintain continence  Assess toileting needs QS/PRN   Swallow/Nutrition/ Hydration             ADL's   pt completes ambulatory ADL transfers with RW and supervision, UB dressing with set- up assist and LB dressing MINA. pt completes 3/3 toileting tasks with supervision and showering from bench with supervision. pt continues to present with impaired grip strength in L hand, impaired balance and mild decreased safety awareness.  MODI  family ed, dynamic balance, LUE grip strength, AE training and DME training/education   Mobility   limited L shoulder ROM d/t hx of rotator cuff injury; bed mobility = supervision, transfers = CGA/ supervision requiring vc for technique, ambulation = >150 ft with and without AD and CGA  Mod I overall with SUP for community ambulation and stair negotiation  balance, L side NMR, transfers and gait quality, family education   Communication   mod I  mod I   speech intelligibility strategies   Safety/Cognition/ Behavioral Observations  mod I-to-sup A  mod I  high level problem solving, awareness   Pain   Verbalize c/o left thigh,hip and knee pain 6-8//10 on pain scale, prn Tylenol 650mg  po decrease pain 3-4/10  goal <= 3  Assess pain QS/PRN   Skin     Dermatitis on face   Skin healing   Cream to dry area prn, monitor skin q shift    Discharge Planning:  Discharging home with intermittent assistance (spouse works at night)   Team Discussion: Patient with right pontine CVA; near baseline cognition and left inattention but good awareness, orientation and problem solving.  BP controlled. Constipation addressed.  Patient on target to meet rehab goals: yes, currently needs supervision for self care, CGA for shower transfers. Needs supervision using an assistive device for ambulation; anticipate will do better with a SPC. Goals for discharge set for mod I overall with supervision for steps.  *See Care Plan and progress notes for long and short-term goals.   Revisions to Treatment Plan:  N/a  Teaching Needs: Safety, medications, dietary modifications, transfers, steps, etc  Current Barriers to Discharge: Decreased caregiver support and Home enviroment access/layout  Possible Resolutions to Barriers: Family education DME: TTB     Medical Summary Current Status: Motor control issues left upper limb, still with reduced balance but overall improving from a functional standpoint, blood pressure still labile but overall improving  Barriers to Discharge: Other (comments)   Possible Resolutions to [employer] Focus: Work on safety anticipate short length  of stay will need just discharge planning and caregiver education prior to discharge   Continued Need for Acute Rehabilitation Level of Care: The patient requires daily medical management by a physician with specialized training in physical medicine and rehabilitation for the following  reasons: Direction of a multidisciplinary physical rehabilitation program to maximize functional independence : Yes Medical management of patient stability for increased activity during participation in an intensive rehabilitation regime.: Yes Analysis of laboratory values and/or radiology reports with any subsequent need for medication adjustment and/or medical intervention. : Yes   I attest that I was present, lead the team conference, and concur with the assessment and plan of the team.   Dorien Chihuahua B 01/30/2022, 3:49 PM

## 2022-01-30 NOTE — Progress Notes (Signed)
Patient ID: Randy [name], male   DOB: 1950-09-28, 71 y.o.   MRN: [id number]  Team Conference Report to Patient/Family  Team Conference discussion was reviewed with the patient and caregiver, including goals, any changes in plan of care and target discharge date.  Patient and caregiver express understanding and are in agreement.  The patient has a target discharge date of 02/05/22.  SW met with patient and discussed team conference updates. Patient will require a Ttb, Sw will follow up with patient spouse to ensure he has one. No additional questions or concerns.  Randy [name] 01/30/2022, 2:17 PM

## 2022-01-30 NOTE — Progress Notes (Signed)
Occupational Therapy Session Note  Patient Details  Name: Randy [name] MRN: [id number] Date of Birth: 10-12-1950  Today's Date: 01/30/2022 OT Individual Time: 1751-0258 and 1030-1100 OT Individual Time Calculation (min): 49 min and 30 min   Short Term Goals: Week 1:  OT Short Term Goal 1 (Week 1): Pt will perform toilet transfer with LRAD with Supervision OT Short Term Goal 2 (Week 1): Pt will perform 3/3 toileting tasks with Supervision OT Short Term Goal 3 (Week 1): Pt will perform UB/LB dress with supervision using hemitechniques OT Short Term Goal 4 (Week 1): Pt will improve LUE to grasp/release and translate 4/4 items  Skilled Therapeutic Interventions/Progress Updates:    Visit 1: Pain: no c/o pain Pt received in bed ready to get ready for the day. Able to sit to EOB independently with bed flat no rails.  Pt wanted to shave first. Stepped to wc at sink to shave sitting down. At home will need to be standing. He then ambulated with no AD and light CGA to shower.  Undressed sitting on tub bench.  When pt bent forward to pick and item off floor, he did use bar for support but noted internal rotation of L knee and hip.  Will need to work on safe reaching to floor level.   Pt showered with supervision and then dried off and donned clothing with no A.  Stood at sink to brush teeth.  Sat to EOB to don TEDS using bag method.  Min A with L TEDs.  (Later in conference, MD stated that he no longer needs to wear the TED hose).  Pt donned and tied shoes without A.  Pt sat in recliner with all needs met.  Belt alarm on.    Visit 2: Pain: no c/o pain  Pt in recliner ready for therapy. Pt has a very tall bed at home but was unsure of using the step to get into bed. He stated that he likes to crawl into bed.  Raised bed to tall height that pt says his bed is at home.  Pt practiced lifting L knee onto bed and then moving to sidelying 3x. Cued pt to fully get knee on bed as far as possible to ensure  he has enough room to move on bed.   Pt then used RW to ambulate to gym.  In gym, pt worked on LLE strength and coordination with stepping up and down step with B hands on rails. Pt practiced L side upup down down, then R.  Then progressed to L up, R toe tap, R down and L down but pt needed sequencing cues. Repeated on R. Tried to progress to L foot up and then knee up, but pt had great difficulty following sequencing.  Pt needed a few short rest breaks. Ambulated with RW to room with supervision.  Pt in recliner with belt alarm on and all needs met.     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 Restrictions Weight Bearing Restrictions: No    Vital Signs: Therapy Vitals Temp: 98.5 F (36.9 C) Pulse Rate: 60 Resp: 20 BP: 133/75 Patient Position (if appropriate): Lying Oxygen Therapy SpO2: 96 % O2 Device: Room Air  ADL: ADL Eating: Set up Grooming: Contact guard Where Assessed-Grooming: Standing at sink Upper Body Bathing: Contact guard Where Assessed-Upper Body Bathing: Shower Lower Body Bathing: Contact guard Where Assessed-Lower Body Bathing: Shower Upper Body Dressing: Setup Lower Body Dressing: Minimal assistance (to  pull up pants over L hip) Toileting: Minimal assistance (to pull up pants over L hip) Where Assessed-Toileting: Glass blower/designer: Therapist, music Method: Counselling psychologist: Energy manager: Curator Method: Heritage manager: Radio broadcast assistant    Therapy/Group: Individual Therapy  [name] 01/30/2022, 8:30 AM

## 2022-01-30 NOTE — Progress Notes (Signed)
PROGRESS NOTE   Subjective/Complaints:  No issues overnite  Pt had good session with Neuropsych yest  ROS:  Pt denies SOB, abd pain, CP, N/V/C/D, and vision changes   Objective:   No results found. Recent Labs    01/28/22 0509  WBC 4.8  HGB 15.1  HCT 44.8  PLT 181    Recent Labs    01/28/22 0509  NA 138  K 4.3  CL 104  CO2 26  GLUCOSE 127*  BUN 12  CREATININE 0.96  CALCIUM 9.6     Intake/Output Summary (Last 24 hours) at 01/30/2022 0756 Last data filed at 01/30/2022 0727 Gross per 24 hour  Intake 1309 ml  Output --  Net 1309 ml         Physical Exam: Vital Signs Blood pressure 133/75, pulse (!) 55, temperature 98.5 F (36.9 C), resp. rate 20, height '5\' 10"'  (1.778 m), weight 98 kg, SpO2 96 %.   General: No acute distress Mood and affect are appropriate Heart: Regular rate and rhythm no rubs murmurs or extra sounds Lungs: Clear to auscultation, breathing unlabored, no rales or wheezes Abdomen: Positive bowel sounds, soft nontender to palpation, nondistended Extremities: No clubbing, cyanosis, or edema   MSK:     No pain with Left knee ROM, no effusion or jt deformity no pain to palp in  popliteal area, over quad and patellar tendons or along joint lines       Strength:           RUE and RLE 5/5 Left UE 4+, LLE 4+    Neurologic exam:  Cognition: AAO to person, place, time and event.  Language: Fluent, No substitutions or neoglisms. Mild dysarthria.  Insight: Good insight into current condition.  Mood: Pleasant affect, appropriate mood.  Sensation: To light touch intact in BL UEs and LEs   Spasticity: MAS 0 in all extremities.   Assessment/Plan: 1. Functional deficits which require 3+ hours per day of interdisciplinary therapy in a comprehensive inpatient rehab setting. Physiatrist is providing close team supervision and 24 hour management of active medical problems listed  below. Physiatrist and rehab team continue to assess barriers to discharge/monitor patient progress toward functional and medical goals  Care Tool:  Bathing    Body parts bathed by patient: Right arm, Left arm, Chest, Abdomen, Front perineal area, Right upper leg, Left upper leg, Face   Body parts bathed by helper: Buttocks, Right lower leg, Left lower leg     Bathing assist Assist Level: Moderate Assistance - Patient 50 - 74%     Upper Body Dressing/Undressing Upper body dressing   What is the patient wearing?: Pull over shirt    Upper body assist Assist Level: Minimal Assistance - Patient > 75%    Lower Body Dressing/Undressing Lower body dressing      What is the patient wearing?: Pants     Lower body assist Assist for lower body dressing: Moderate Assistance - Patient 50 - 74%     Toileting Toileting    Toileting assist Assist for toileting: Minimal Assistance - Patient > 75%     Transfers Chair/bed transfer  Transfers assist     Chair/bed  transfer assist level: Minimal Assistance - Patient > 75%     Locomotion Ambulation   Ambulation assist      Assist level: Minimal Assistance - Patient > 75% Assistive device: No Device Max distance: 177f   Walk 10 feet activity   Assist     Assist level: Minimal Assistance - Patient > 75% Assistive device: No Device   Walk 50 feet activity   Assist    Assist level: Minimal Assistance - Patient > 75% Assistive device: No Device    Walk 150 feet activity   Assist    Assist level: Minimal Assistance - Patient > 75% Assistive device: No Device    Walk 10 feet on uneven surface  activity   Assist     Assist level: Minimal Assistance - Patient > 75% Assistive device: Other (comment) (rail up ramp)   Wheelchair     Assist Is the patient using a wheelchair?: Yes Type of Wheelchair: Manual    Wheelchair assist level: Minimal Assistance - Patient > 75% Max wheelchair distance:  1571f   Wheelchair 50 feet with 2 turns activity    Assist        Assist Level: Minimal Assistance - Patient > 75%   Wheelchair 150 feet activity     Assist      Assist Level: Minimal Assistance - Patient > 75%   Blood pressure 133/75, pulse (!) 55, temperature 98.5 F (36.9 C), resp. rate 20, height '5\' 10"'  (1.778 m), weight 98 kg, SpO2 96 %.  Medical Problem List and Plan: 1. 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  Plan 30-day cardiac event monitor on discharge             -patient may shower             -ELOS/Goals: 10-14 days, Mod I PT/OT/SLP, Team conference today please see physician documentation under team conference tab, met with team  to discuss problems,progress, and goals. Formulized individual treatment plan based on medical history, underlying problem and comorbidities.              - Primary deficits RUE > RLE hemiparesis and vertigo, worse when laying down   Con't CIR- - PT/OT and SLP, team conf in am  2.  Antithrombotics: -DVT/anticoagulation:  Pharmaceutical: Heparin             -antiplatelet therapy: Aspirin 81 mg daily and Plavix 75 mg day x3 weeks then Plavix alone (per neurology consult note; DC summary lists as continue ASA alone).    3. Pain Management: Tylenol PRN, Oxycodone 5 mg every 6 hours as needed     10/21- denies pain- con't regimen prn 4. Mood/Behavior/Sleep: Cymbalta 30 mg daily, Paxil 5 mg nightly.  Provide emotional support             -antipsychotic agents: N/A   5. Neuropsych/cognition: This patient is capable of making decisions on his own behalf.   6. Skin/Wound Care: Routine skin checks   7. Fluids/Electrolytes/Nutrition: Routine in and outs with follow-up chemistries.   8.  Permissive hypertension (up to 220/110, normotension goal at 5-7 days).  Coreg 12.5 mg twice daily, lisinopril 20 mg daily.  Monitor with increased mobility    Vitals:   01/29/22 2007 01/30/22 0555  BP: 109/73 133/75   Pulse: (!) 59 (!) 55  Resp: 16 20  Temp: 98.6 F (37 C) 98.5 F (36.9 C)  SpO2: 94% 96%  Controlled 10/25  9.  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  Monitor for any signs of fluid overload.  Follow-up outpatient cardiology services   10.  History of alcohol and tobacco use (half ppd).  NicoDerm patch.  Provide counseling.  Monitor for any signs of withdrawal.               - + chronic cough, likely underlying chronic lung dz; add PRN duonebs   10/21- no cough heard this AM-con't regimen 11.  Hyperlipidemia.  Lipitor   12.  BPH.  Check PVR.  Patient on Hytrin 4 mg nightly prior to admission held for permissive hypertension.  Resume as needed   13. Rosacea. Takes OTC cream at home; advised wife bring in and can add do med list.    14. Constipation. Having 2x BM yesterday but endorsing hard/difficult to pass stools.         - Start Miralax daily + Sennakot-S PRN   10/21- LBM yesterday- con't to monitor  15. OSA: noncompliant with home CPAP. Monitor vitals.  16. Hx of blepharospasm , receives botox injections   LOS: 5 days A FACE TO [place] E Amit Leece 01/30/2022, 7:56 AM

## 2022-01-31 NOTE — Progress Notes (Addendum)
Physical Therapy Session Note  Patient Details  Name: Randy [name] MRN: [id number] Date of Birth: 1950/08/14  Today's Date: 01/31/2022 PT Individual Time:  237-628 and 1315-1415  PT Individual Time Calculation (min): 57 min 70 min   Short Term Goals: Week 1:  PT Short Term Goal 1 (Week 1): Pt will perform all transfers with superision assist PT Short Term Goal 2 (Week 1): Pt will improve Berg balance score >45 to indicate reduced fall risk PT Short Term Goal 3 (Week 1): Pt will ambulate 139f with supersion assist with LRAD  Session 1 Skilled Therapeutic Interventions/Progress Updates:  Pt supine in bed with nutrition services present on entrance. Pt alert and agreeable to PT session.  Pt with no complaint at start of session. Requests to use the bathroom, wash his face, and brush teeth at the beginning of treatment session.   Therapeutic Activity: Bed mobility: Pt performed supine to sit with supervision.  Personal hygiene : Pt performed face wash and brushed teeth in front of room sink. Provided verbal and tactile cuing for maintaining upright posture and glute activation during functional activities.   Tranfers: Pt performed sit to stand from edge of bed and sit<>stand from room toilet with supervision and without verbal or tactile cuing.  Neuro Muscular Re-ed: NMR facilitated during session with focus on gait sequencing, balance, strengthening, and lower extremity coordination.   Side lying clam shells (3 x 10): Performed on left side only. Provided manual resistance for the last two rounds. Pt required verbal and tactile cuing for glute activation and leg positioning.  Supine glute bridges (3 x 10): Pt required verbal and tactile cuing for maintaining glute activation.  Sit to stands from high low table (3 x 10) Performed with supervision. Had Pt hold 4 lb weighted ball for the last two round. Provided verbal cuing for glute activation and maintaining upright  posture.  Lateral straight leg raise (2 x 8 ea): Performed in parallel bars with CGA. Pt required verbal and tactile cuing for foot positioning and maintaining upright posture.   Lateral step over object (2 x 8 ea): Performed in parallel bars with CGA. Pt required verbal cuing for foot placement  and maintaining upright posture. Provided cuing for glute activation and maintaining postural balance in order to decrease compensatory strategies and increase proper body mechanics.     Lateral step up on 6 inch step (2 x 8 ea): Performed in parallel bars with CGA to Min A. Pt required verbal and tactile for maintaining upright posture, foot placement and maintaining glute activation during activity to reduce compensatory strategies.   Pt was seen briefly by attending physician towards the end of the session.  Pt supine in bed with alarm on and all needs met.  Session 2 Skilled Therapeutic Interventions/Progress Updates:  Pt supine in bed on entrance. Pt alert and agreeable to PT session.  Pt with no complaint at start of session.   Therapeutic Activity: Bed mobility: Pt performed supine to sit to edge of bed with supervision.  Transfers: Pt. performed sit to stand from edge of bed with supervision and without verbal or tactile cuing.  Gait: Pt ambulated from room to therapy to gym using single point cane with supervision. Pt was encouraged to pick feet up and maintain upright posture in order to reduce compensatory strategies during ambulation.  6MWT: 792 ft. Performed with single point cane with supervision. Pt was able to complete test without stopping and required rest break at the end.  Neuro  Muscular Re-ed: NMR facilitated during session with focus on improving strength, balance and coordination, sequencing, judgement and self confidence in all aspect of mobility at the highest level.  Forwards & backwards walking (x 4): Performed in parallel bars with CGA. Provided verbal and  tactile cuing for upright posture. Pt performed initial sequence with step to pattern and quickly transitioned to step through sequence after the first attempt.  Stair steps (x 4): Performed in gym with CGA. Started with alternating toe taps and progressed to going up and down steps with use of both hands on rails. Patient required verbal and tactile cuing to emphasize left knee flexion and ankle dorsiflexion, and left glute activation.  Sanding balance (x4): Performed on blue foam pad with CGA. Performed eyes open for 1 minute and then eyes closed for 10 seconds. Took rest break after each round. Provided verbal cuing for glute activation  Additional balance exercises: Pt performed corn hole activity with emphasis on postural balance and automatic postural adjustments by reaching for bean bag  on table positioned on either direction of Pt (x 3). Pt was allowed to take one step towards the table to retrieve bean bag and return to initial position before throwing.  Patient was then instructed to pick up bean bags from corn hole board and put them in bin at an elevated height (x 3). Provided CGA and tactile cuing for maintaining postural balance.  Pt required consistent reinforcement to perform activity correctly.  Pt performed sit to stands on blue foam bad while shooting basketball (3x8). Provided verbal and tactile cuing for maintaining postural balance.   Pt receptive to education and instructions but can easily get distracted during activity, requires consistent reinforcement to perform therapeutic activities effectively.   Pt ambulated back to room using single point cane with close supervision. Pt performed  sit<>stand from room toilet with supervision and without verbal or tactile cuing.   Pt supine in bed with alarm on and all needs met.   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   Barrie Folk PT, DPT   7:35 AM 02/01/22

## 2022-01-31 NOTE — Progress Notes (Signed)
Occupational Therapy Session Note  Patient Details  Name: Randy [name] MRN: [id number] Date of Birth: May 27, 1950  Today's Date: 01/31/2022 OT Individual Time: 0511-0211 OT Individual Time Calculation (min): 60 min    Short Term Goals: Week 1:  OT Short Term Goal 1 (Week 1): Pt will perform toilet transfer with LRAD with Supervision OT Short Term Goal 2 (Week 1): Pt will perform 3/3 toileting tasks with Supervision OT Short Term Goal 3 (Week 1): Pt will perform UB/LB dress with supervision using hemitechniques OT Short Term Goal 4 (Week 1): Pt will improve LUE to grasp/release and translate 4/4 items  Skilled Therapeutic Interventions/Progress Updates:    Pt received resting in bed ready for OT and to shower. Pt instructed to ambulate around the room to gather clothing and supplies. Pt was able to do this and shower, dress, toilet all with supervision. He needs supervision and cues due to left inattention and mild impulsivity.  For example, pt needed cues to move further into shower bench so he was not sitting so close to edge of bench or cues to fully adjust clothing over L side of body.    Pt ambulated with cane with close S to therapy gym. Pt worked on standing balance with basket ball bounce and toss. He was a bit impulsive in "showing off" his moves and tried to jump for a free throw shot.  Pt needed min guard for balance.  L shoulder AROM exercises with rolling ball back and forth on table. Pt returned to room to sit in recliner with all needs met.  Belt alarm on.   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 Restrictions Weight Bearing Restrictions: No   Pain: Pain Assessment Pain Scale: 0-10 Pain Score: 0-No pain Patients Stated Pain Goal: 2 Pain Intervention(s): Medication (See eMAR) Multiple Pain Sites: No ADL: ADL Eating: Independent Grooming: Independent Where Assessed-Grooming: Standing at  sink Upper Body Bathing: Supervision/safety Where Assessed-Upper Body Bathing: Shower Lower Body Bathing: Supervision/safety Where Assessed-Lower Body Bathing: Shower Upper Body Dressing: Setup Lower Body Dressing: Supervision/safety Where Assessed-Lower Body Dressing: Edge of bed Toileting: Supervision/safety Where Assessed-Toileting: Glass blower/designer: Close supervision Toilet Transfer Method: Counselling psychologist: Energy manager: Close supervision Social research officer, government Method: Heritage manager: Radio broadcast assistant   Therapy/Group: Individual Therapy  [place] 01/31/2022, 12:07 PM

## 2022-01-31 NOTE — Discharge Summary (Signed)
Physician Discharge Summary  Patient ID: Randy [name] MRN: [id number] DOB/AGE: 1950/11/25 71 y.o.  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 [facility] Procedure: 2D Echo, Cardiac Doppler and Color Doppler Indications:    Stroke I63.9  History:        Patient has no prior history of Echocardiogram examinations.                 Risk Factors:Hypertension and Dyslipidemia. ETOH abuse, Tobacco                 use.  Sonographer:    Celesta Gentile RCS Referring Phys: [id number] ASIA B ZIERLE-GHOSH IMPRESSIONS  1. Left ventricular ejection fraction, by estimation, is 40 to 45%. The left ventricle has mildly decreased function. The left ventricle demonstrates global hypokinesis. Left ventricular diastolic parameters are consistent with Grade I diastolic dysfunction (impaired relaxation).  2. Right ventricular systolic function is mildly reduced. The right ventricular size is normal. Tricuspid regurgitation signal is inadequate for assessing PA pressure.  3. The mitral valve is grossly normal. Trivial mitral valve regurgitation. No evidence of mitral stenosis.  4. The aortic valve is  tricuspid. There is moderate calcification of the aortic valve. There is mild thickening of the aortic valve. Aortic valve regurgitation is not visualized. Aortic valve sclerosis/calcification is present, without any evidence of aortic stenosis.  5. The inferior vena cava is normal in size with greater than 50% respiratory variability, suggesting right atrial pressure of 3 mmHg. FINDINGS  Left Ventricle: Left ventricular ejection fraction, by estimation, is 40 to 45%. The left ventricle has mildly decreased function. The left ventricle demonstrates global hypokinesis. The left ventricular internal cavity size was normal in size. There is  no left ventricular hypertrophy. Left ventricular diastolic parameters are consistent with Grade I diastolic dysfunction (impaired relaxation). Right Ventricle: The right ventricular size is normal. No increase in right ventricular wall thickness. Right ventricular systolic function is mildly reduced. Tricuspid regurgitation signal is inadequate for assessing PA pressure. Left Atrium: Left atrial size was normal in size. Right Atrium: Right atrial size was normal in size. Pericardium: There is no evidence of pericardial effusion. Mitral Valve: The mitral valve is grossly normal. Trivial mitral valve regurgitation. No evidence of mitral valve stenosis. Tricuspid Valve: The tricuspid valve is grossly normal. Tricuspid valve regurgitation is not demonstrated. No evidence of tricuspid stenosis. Aortic Valve:  The aortic valve is tricuspid. There is moderate calcification of the aortic valve. There is mild thickening of the aortic valve. Aortic valve regurgitation is not visualized. Aortic valve sclerosis/calcification is present, without any evidence of aortic stenosis. Pulmonic Valve: The pulmonic valve was grossly normal. Pulmonic valve regurgitation is not visualized. No evidence of pulmonic stenosis. Aorta: The aortic root and ascending aorta are structurally normal, with no  evidence of dilitation. Venous: The inferior vena cava is normal in size with greater than 50% respiratory variability, suggesting right atrial pressure of 3 mmHg. IAS/Shunts: The atrial septum is grossly normal.  LEFT VENTRICLE PLAX 2D LVIDd:         4.30 cm   Diastology LVIDs:         2.70 cm   LV e' medial:    5.55 cm/s LV PW:         1.00 cm   LV E/e' medial:  10.0 LV IVS:        1.00 cm   LV e' lateral:   6.64 cm/s LVOT diam:     2.10 cm   LV E/e' lateral: 8.4 LV SV:         59 LV SV Index:   28 LVOT Area:     3.46 cm  RIGHT VENTRICLE RV S prime:     8.49 cm/s TAPSE (M-mode): 1.9 cm LEFT ATRIUM             Index        RIGHT ATRIUM           Index LA diam:        3.00 cm 1.39 cm/m   RA Area:     12.80 cm LA Vol (A2C):   28.7 ml 13.33 ml/m  RA Volume:   30.50 ml  14.17 ml/m LA Vol (A4C):   45.0 ml 20.91 ml/m LA Biplane Vol: 39.3 ml 18.26 ml/m  AORTIC VALVE LVOT Vmax:   78.30 cm/s LVOT Vmean:  50.800 cm/s LVOT VTI:    0.171 m  AORTA Ao Root diam: 3.40 cm MITRAL VALVE MV Area (PHT): 3.42 cm    SHUNTS MV Decel Time: 222 msec    Systemic VTI:  0.17 m MV E velocity: 55.50 cm/s  Systemic Diam: 2.10 cm MV A velocity: 62.40 cm/s MV E/A ratio:  0.89 Lennie Odor MD Electronically signed by Lennie Odor MD Signature Date/Time: 01/21/2022/10:38:26 AM    Final    US Carotid Bilateral  Result Date: 01/21/2022 CLINICAL DATA:  Slurred speech Left-sided weakness Hyperlipidemia Current tobacco user EXAM: BILATERAL CAROTID DUPLEX ULTRASOUND TECHNIQUE: Wallace Cullens scale imaging, color Doppler and duplex ultrasound were performed of bilateral carotid and vertebral arteries in the neck. COMPARISON:  None available FINDINGS: Criteria: Quantification of carotid stenosis is based on velocity parameters that correlate the residual internal carotid diameter with NASCET-based stenosis levels, using the diameter of the distal internal carotid lumen as the denominator for stenosis measurement. The following velocity measurements  were obtained: RIGHT ICA: 74/20 cm/sec CCA: 105/19 cm/sec SYSTOLIC ICA/CCA RATIO:  0.7 ECA: 121 cm/sec LEFT ICA: 87/15 cm/sec CCA: 90/60 cm/sec SYSTOLIC ICA/CCA RATIO:  1.0 ECA: 112 cm/sec RIGHT CAROTID ARTERY: Minimal calcified plaque of the ICA origin. RIGHT VERTEBRAL ARTERY:  Antegrade flow. LEFT CAROTID ARTERY: Minimal heterogeneous plaque in the carotid bifurcation. LEFT VERTEBRAL ARTERY:  Antegrade flow. IMPRESSION: No significant stenosis of internal carotid arteries. Electronically Signed   By: Acquanetta Belling M.D.   On: 01/21/2022 09:08   MR BRAIN  WO CONTRAST  Result Date: 01/21/2022 CLINICAL DATA:  No deficit, acute, stroke suspected. Left-sided weakness. Slurred speech. EXAM: MRI HEAD WITHOUT CONTRAST TECHNIQUE: Multiplanar, multiecho pulse sequences of the brain and surrounding structures were obtained without intravenous contrast. COMPARISON:  CT head without contrast 01/20/2022 FINDINGS: Brain: Acute nonhemorrhagic right paramedian pontine infarct measures up to 9 mm. No acute supratentorial infarct is present. T2 signal changes are associated with the acute infarct. Moderate generalized atrophy is present. Minimal periventricular and subcortical T2 hyperintensities are within normal limits for age. The ventricles are proportionate to the degree of atrophy. No significant extraaxial fluid collection is present. Deep brain nuclei are within normal limits. The internal auditory canals are within normal limits. Brainstem and cerebellum are otherwise unremarkable. Vascular: Flow is present in the major intracranial arteries. Skull and upper cervical spine: Degenerative changes are present in the upper cervical spine with at least mild central canal narrowing at C3-4. The craniocervical junction is normal. Marrow signal is normal. Midline structures are unremarkable. Sinuses/Orbits: The paranasal sinuses and mastoid air cells are clear. The globes and orbits are within normal limits. IMPRESSION: 1. Acute  nonhemorrhagic right paramedian pontine infarct measures up to 9 mm. 2. Moderate generalized atrophy. 3. Degenerative changes in the upper cervical spine with at least mild central canal narrowing at C3-4. These results were called by telephone at the time of interpretation on 01/21/2022 at 7:31 am to provider Dr. Renaye Rakers, who verbally acknowledged these results. Electronically Signed   By: Marin Roberts M.D.   On: 01/21/2022 07:32   MR ANGIO HEAD WO CONTRAST  Result Date: 01/21/2022 CLINICAL DATA:  No deficit, acute, stroke suspected. Acute onset of left-sided weakness. Right pontine infarct. EXAM: MRA HEAD WITHOUT CONTRAST TECHNIQUE: Angiographic images of the Circle of Willis were acquired using MRA technique without intravenous contrast. COMPARISON:  MR head without contrast 01/21/2022 FINDINGS: Anterior circulation: The internal carotid arteries are within normal limits from the high cervical segments through the ICA termini. Left A1 is dominant. The right A1 is aplastic. Both A2 segments fill from the left. M1 segments are within normal limits bilaterally. Moderate stenosis is present in the proximal posteroinferior right M2 segment. Moderate to high-grade stenoses are present in the proximal M3 segments of the more superior M2 division. There may be some artifact in this region. More distal segmental narrowing is present in the left MCA branches. Posterior circulation: The vertebral arteries are codominant. PICA origins are not visualized. Vertebrobasilar junction and basilar artery are normal. Proximal PCA vessels are within normal limits. Signal loss is present in the more distal vessels. Anatomic variants: Aplastic right A1 segment Other: None. IMPRESSION: 1. 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 2. Moderate stenosis in the proximal posteroinferior right M2 segment. 3. More distal segmental narrowing in the left MCA branches. 4. No  significant proximal stenosis, aneurysm, or branch vessel occlusion in the circle-of-Willis. Electronically Signed   By: Marin Roberts M.D.   On: 01/21/2022 07:31   CT HEAD CODE STROKE WO CONTRAST  Result Date: 01/20/2022 CLINICAL DATA:  Code stroke.  Slurred speech EXAM: CT HEAD WITHOUT CONTRAST TECHNIQUE: Contiguous axial images were obtained from the base of the skull through the vertex without intravenous contrast. RADIATION DOSE REDUCTION: This exam was performed according to the departmental dose-optimization program which includes automated exposure control, adjustment of the mA and/or kV according to patient size and/or use of iterative reconstruction technique. COMPARISON:  None Available. FINDINGS: Brain:  There is no mass, hemorrhage or extra-axial collection. The size and configuration of the ventricles and extra-axial CSF spaces are normal. There is hypoattenuation of the periventricular white matter, most commonly indicating chronic ischemic microangiopathy. Vascular: No abnormal hyperdensity of the major intracranial arteries or dural venous sinuses. No intracranial atherosclerosis. Skull: The visualized skull base, calvarium and extracranial soft tissues are normal. Sinuses/Orbits: No fluid levels or advanced mucosal thickening of the visualized paranasal sinuses. No mastoid or middle ear effusion. The orbits are normal. ASPECTS [facility] Stroke Program Early CT Score) - Ganglionic level infarction (caudate, lentiform nuclei, internal capsule, insula, M1-M3 cortex): 7 - Supraganglionic infarction (M4-M6 cortex): 3 Total score (0-10 with 10 being normal): 10 IMPRESSION: 1. No acute intracranial abnormality. 2. ASPECTS is 10 These results were called by telephone at the time of interpretation on 01/20/2022 at 10:20 pm to provider [facility] , who verbally acknowledged these results. Electronically Signed   By: Ulyses Jarred M.D.   On: 01/20/2022 22:20    Labs:  Basic Metabolic Panel: No  results for input(s): "NA", "K", "CL", "CO2", "GLUCOSE", "BUN", "CREATININE", "CALCIUM", "MG", "PHOS" in the last 168 hours.   CBC: No results for input(s): "WBC", "NEUTROABS", "HGB", "HCT", "MCV", "PLT" in the last 168 hours.   CBG: No results for input(s): "GLUCAP" in the last 168 hours.  Family history.  Father with myocardial infarction Sister with breast cancer.  Negative for colon cancer esophageal cancer or rectal cancer  Brief HPI:   Randy [name] is a 71 y.o. right-handed male with history of BPH glaucoma systolic and diastolic heart failure hyperlipidemia hypertension alcohol as well as tobacco use.  Per chart review lives with spouse independent prior to admission.  Presented to [facility] 01/20/2022 with acute onset of left-sided weakness dizziness and slurred speech.  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  Moderate generalized atrophy.  MRI of the head and neck moderate to high-grade stenosis proximal M3 segments of the right superior division.  Patient did not receive tPA.  Admission chemistries unremarkable except glucose 149 alcohol negative.  Carotid ultrasound no stenosis.  Echocardiogram with ejection fraction of 40 to 45%.  Left ventricle demonstrating global hypokinesis as well as grade 1 diastolic dysfunction.  Neurology follow-up placed on low-dose aspirin and Plavix x3 weeks then Plavix alone as documented by neurology services 01/21/2022.  Subcutaneous heparin for DVT prophylaxis.  Permissive hypertension and monitored.  Therapy evaluations completed due to patient decreased functional mobility was admitted for a comprehensive rehab program.   Hospital Course: Randy [name] was admitted to rehab 01/25/2022 for inpatient therapies to consist of PT, ST and OT at least three hours five days a week. Past admission physiatrist, therapy team and rehab RN have worked together to provide customized collaborative inpatient  rehab.  Pertaining to patient's right paramedian pontine infarction remained stable aspirin and Plavix x3 weeks then Plavix alone.  Subcutaneous heparin for DVT prophylaxis.  Pain managed with use of oxycodone on a limited basis for pain.  Mood stabilization with Cymbalta as well as Paxil and emotional support provided and follow-up per neuropsychology.Marland Kitchen  Permissive hypertension maintained on Coreg as well as lisinopril.  History of tobacco alcohol use NicoDerm patch as directed receiving counts regards to cessation of alcohol or tobacco use.  History of combined acute chronic systolic congestive heart failure patient exhibited no signs of fluid overload.  BPH he had been on Hytrin and passed voiding without difficulty.  Bouts of constipation resolved  with laxative assistance.  Documentation of OSA noncompliant with CPAP at home oxygen saturations remained 90%.   Blood pressures were monitored on TID basis and controlled     Rehab course: During patient's stay in rehab weekly team conferences were held to monitor patient's progress, set goals and discuss barriers to discharge. At admission, patient required minimal assist 85 feet rolling walker supervision sit to stand  Physical exam.  Blood pressure 148/81 pulse 56 temperature 98.1 respirations 18 oxygen saturations 96% room air Constitutional.  No acute distress HEENT Head.  Normocephalic and atraumatic Eyes.  Pupils round and reactive to light no discharge without nystagmus Neck.  Supple nontender no JVD without thyromegaly Cardiac regular rate and rhythm without any extra sounds or murmur heard Abdomen.  Soft nontender positive bowel sounds without rebound Respiratory effort normal no respiratory distress without wheeze Skin.  Dry erythematous rash around upper lip nares and forehead Musculoskeletal Strength.  Right upper extremity 5/5 SA 5/5 DF 5/5 EE 5/5W EE 5/5 FF 5/5 FA Left upper extremity 3/5 SA 4 -/5 ADF 4 -/5 EE 4 -/5W EE 4 -/5 FF  3+/5 FA Right lower extremity 5/5 Left lower extremity 4 -/5 HF 5 -/5 KE 5 -/5 DF 5 -/5 EHL 5 -/5 PF Neurologic.  Alert oriented mild dysarthria was intelligible.  He/She  has had improvement in activity tolerance, balance, postural control as well as ability to compensate for deficits. He/She has had improvement in functional use RUE/LUE  and RLE/LLE as well as improvement in awareness.  Patient ambulates to the gym using rolling walker/straight cane for supervision.  Contact-guard for stepping around obstacles.  He ambulates without assistive devices to the shower.  Undress sitting on tub bench supervision donning clothing.  He was able to fully communicate his needs.  Full family teaching completed plan discharge to home       Disposition: Discharge to home    Diet: Regular  Special Instructions: No driving smoking or alcohol  Medications at discharge 1.  Tylenol as needed 2.  Aspirin 81 mg p.o. daily until 02/10/2022 and stop 3.  Lipitor 40 mg p.o. daily 4.  Coreg 12.5 mg p.o. twice daily 5.  Plavix 75 mg p.o. daily 6.  Cymbalta 30 mg p.o. daily 7.  Prilosec 20 mg daily 8.  Folic acid 1 mg p.o. daily 9.  Lisinopril 20 mg p.o. daily 10.  Multivitamin daily 11.  NicoDerm patch taper as directed 12.  Oxycodone 5 mg every 6 hours as needed moderate pain 13.  Protonix 40 mg p.o. daily 14.  Paxil 5 mg p.o. nightly 15.  MiraLAX daily hold for loose stools 16.  Vitamin D 2000 units p.o. daily 17.Xalatan ophthalmic solution 0.005% 1 drop both eyes bedtime   30-35 minutes were spent completing discharge summary and discharge planning  Discharge Instructions     Ambulatory referral to Neurology   Complete by: As directed    An appointment is requested in approximately: 4 weeks right pontine infarction   Ambulatory referral to Physical Medicine Rehab   Complete by: As directed    Moderate complexity follow-up 1 to 2 weeks right parietal infarction        Follow-up  Information     Kirsteins, Victorino Sparrow, MD Follow up.   Specialty: Physical Medicine and Rehabilitation Why: Office to call for appointment Contact information: [address] [place] [address] [phone]  Signed: Mcarthur RossettiDaniel J Kinte Trim 02/05/2022, 5:19 AM

## 2022-01-31 NOTE — Progress Notes (Signed)
PROGRESS NOTE   Subjective/Complaints:  Seen in PT, discussed D/C date and no driving post d/c until cleared by MD  ROS:  Pt denies SOB, abd pain, CP, N/V/C/D, and vision changes   Objective:   No results found. No results for input(s): "WBC", "HGB", "HCT", "PLT" in the last 72 hours.  No results for input(s): "NA", "K", "CL", "CO2", "GLUCOSE", "BUN", "CREATININE", "CALCIUM" in the last 72 hours.   Intake/Output Summary (Last 24 hours) at 01/31/2022 0852 Last data filed at 01/31/2022 0746 Gross per 24 hour  Intake 598 ml  Output 2550 ml  Net -1952 ml         Physical Exam: Vital Signs Blood pressure 117/86, pulse (!) 58, temperature 98.1 F (36.7 C), temperature source Oral, resp. rate 16, height 5\' 10"  (1.778 m), weight 98 kg, SpO2 93 %.   General: No acute distress Mood and affect are appropriate Heart: Regular rate and rhythm no rubs murmurs or extra sounds Lungs: Clear to auscultation, breathing unlabored, no rales or wheezes Abdomen: Positive bowel sounds, soft nontender to palpation, nondistended Extremities: No clubbing, cyanosis, or edema   MSK:     No pain with Left knee ROM, no effusion or jt deformity no pain to palp in  popliteal area, over quad and patellar tendons or along joint lines       Strength:           RUE and RLE 5/5 Left UE 4+, LLE 4+    Neurologic exam:  Cognition: AAO to person, place, time and event.  Language: Fluent, No substitutions or neoglisms. Mild dysarthria.  Insight: Good insight into current condition.  Mood: Pleasant affect, appropriate mood.  Sensation: To light touch intact in BL UEs and LEs   Spasticity: MAS 0 in all extremities.   Assessment/Plan: 1. Functional deficits which require 3+ hours per day of interdisciplinary therapy in a comprehensive inpatient rehab setting. Physiatrist is providing close team supervision and 24 hour management of active medical  problems listed below. Physiatrist and rehab team continue to assess barriers to discharge/monitor patient progress toward functional and medical goals  Care Tool:  Bathing    Body parts bathed by patient: Right arm, Left arm, Chest, Abdomen, Front perineal area, Right upper leg, Left upper leg, Face   Body parts bathed by helper: Buttocks, Right lower leg, Left lower leg     Bathing assist Assist Level: Moderate Assistance - Patient 50 - 74%     Upper Body Dressing/Undressing Upper body dressing   What is the patient wearing?: Pull over shirt    Upper body assist Assist Level: Minimal Assistance - Patient > 75%    Lower Body Dressing/Undressing Lower body dressing      What is the patient wearing?: Pants     Lower body assist Assist for lower body dressing: Moderate Assistance - Patient 50 - 74%     Toileting Toileting    Toileting assist Assist for toileting: Minimal Assistance - Patient > 75%     Transfers Chair/bed transfer  Transfers assist     Chair/bed transfer assist level: Minimal Assistance - Patient > 75%     Locomotion Ambulation  Ambulation assist      Assist level: Minimal Assistance - Patient > 75% Assistive device: No Device Max distance: 113ft   Walk 10 feet activity   Assist     Assist level: Minimal Assistance - Patient > 75% Assistive device: No Device   Walk 50 feet activity   Assist    Assist level: Minimal Assistance - Patient > 75% Assistive device: No Device    Walk 150 feet activity   Assist    Assist level: Minimal Assistance - Patient > 75% Assistive device: No Device    Walk 10 feet on uneven surface  activity   Assist     Assist level: Minimal Assistance - Patient > 75% Assistive device: Other (comment) (rail up ramp)   Wheelchair     Assist Is the patient using a wheelchair?: Yes Type of Wheelchair: Manual    Wheelchair assist level: Minimal Assistance - Patient > 75% Max  wheelchair distance: 153ft    Wheelchair 50 feet with 2 turns activity    Assist        Assist Level: Minimal Assistance - Patient > 75%   Wheelchair 150 feet activity     Assist      Assist Level: Minimal Assistance - Patient > 75%   Blood pressure 117/86, pulse (!) 58, temperature 98.1 F (36.7 C), temperature source Oral, resp. rate 16, height 5\' 10"  (1.778 m), weight 98 kg, SpO2 93 %.  Medical Problem List and Plan: 1. 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  Plan 30-day cardiac event monitor on discharge             -patient may shower             -ELOS/Goals: 10/31               Con't CIR- - PT/OT and SLP, 2.  Antithrombotics: -DVT/anticoagulation:  Pharmaceutical: Heparin             -antiplatelet therapy: Aspirin 81 mg daily and Plavix 75 mg day x3 weeks then Plavix alone (per neurology consult note; DC summary lists as continue ASA alone).    3. Pain Management: Tylenol PRN, Oxycodone 5 mg every 6 hours as needed     10/21- denies pain- con't regimen prn 4. Mood/Behavior/Sleep: Cymbalta 30 mg daily, Paxil 5 mg nightly.  Provide emotional support             -antipsychotic agents: N/A   5. Neuropsych/cognition: This patient is capable of making decisions on his own behalf.   6. Skin/Wound Care: Routine skin checks   7. Fluids/Electrolytes/Nutrition: Routine in and outs with follow-up chemistries.   8.  Permissive hypertension (up to 220/110, normotension goal at 5-7 days).  Coreg 12.5 mg twice daily, lisinopril 20 mg daily.  Monitor with increased mobility    Vitals:   01/30/22 2012 01/31/22 0526  BP: 106/76 117/86  Pulse: (!) 57 (!) 58  Resp: 18 16  Temp: 98.8 F (37.1 C) 98.1 F (36.7 C)  SpO2: 94% 93%  Controlled 10/26  9.  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  Monitor for any signs of fluid overload.  Follow-up outpatient cardiology services   10.  History of alcohol and  tobacco use (half ppd).  NicoDerm patch.  Provide counseling.  Monitor for any signs of withdrawal.               - + chronic cough, likely underlying chronic lung dz; add PRN  duonebs   10/21- no cough heard this AM-con't regimen 11.  Hyperlipidemia.  Lipitor   12.  BPH.  Check PVR.  Patient on Hytrin 4 mg nightly prior to admission held for permissive hypertension.  Resume as needed   13. Rosacea. Takes OTC cream at home; advised wife bring in and can add do med list.    14. Constipation. Having 2x BM yesterday but endorsing hard/difficult to pass stools.         - Start Miralax daily + Sennakot-S PRN   10/21- LBM yesterday- con't to monitor  15. OSA: noncompliant with home CPAP. Monitor vitals.  16. Hx of blepharospasm , receives botox injections   LOS: 6 days A FACE TO FACE EVALUATION WAS PERFORMED  Erick Colace 01/31/2022, 8:52 AM

## 2022-02-01 ENCOUNTER — Ambulatory Visit: Payer: No Typology Code available for payment source

## 2022-02-01 NOTE — Progress Notes (Addendum)
Physical Therapy Session Note  Patient Details  Name: Randy [name] MRN: [id number] Date of Birth: [birth year]  Today's Date: 02/01/2022 PT Individual Time: 9:15 - 10:25 PT Individual Time Calculation (min): 70 min     Short Term Goals: Week 1:  PT Short Term Goal 1 (Week 1): Pt will perform all transfers with superision assist PT Short Term Goal 2 (Week 1): Pt will improve Berg balance score >45 to indicate reduced fall risk PT Short Term Goal 3 (Week 1): Pt will ambulate 14f with supersion assist with LRAD  Skilled Therapeutic Interventions/Progress Updates:    Pt supine in bed on entrance. Pt aler and agreeable to PT session.  Pt with complaint of slight left leg pain at of session but insisted that he is good for treatment session.    Therapeutic Activity:  Bed mobility: Pt performed supine to sit with supervision.  Personal hygiene: Pt brushed teeth in front of room sink. Provided verbal and tactile cuing for maintaining upright posture and glute activation during functional activities. Discouraged use of left hand to weight bear on room sink.  Transfers: Pt performed sit to stand from edge of bed and sit<> stand from room toilet with supervision and without verbal and tactile cuing.  Pt performed car transfer in therapy gym from various heights with supervision using a single point cane. Provided verbal cuing for foot postioning during entering and exiting  stationary vehicle.     Gait: Pt ambulated from room to therapy gyms using single point cane with supervision. Pt ambulated on uneven surfaces in therapy gym with supervision assist. Pt was instructed to walk up 10 ft ramp, through 4 ft of mulch surface and down a 6 inch curb. Provided verbal cuing for foot positioning and cane placement for stepping down side curb.  Neuro Muscular Re-ed: NMR facilitated during session with focus on improving strength, balance and coordination, sequencing, judgement and self confidence in  all aspect of mobility at the highest level.  BITS (x 4) Pt performed visual scanning sequencing with supervision to CGA. Pt balanced on red foam pad for the last three attempts. Pt required verbal and tactile cuing to maintain upright posture during activity. Pt required occasional verbal cuing for retaining sequence and maintaining attention to task.   Lateral straight leg raise (2 x 8 ea): Performed  with 2lb ankle weights B LE at parallel bars with supervision assist. Pt required verbal and tactile cuing for foot positioning and maintaining upright posture.  Lateral step over & on to object: (3 x 8 ea): Pt was instructed to step over 2 inch  rectangular object and step onto a 6 inch step. Performed with 2lb ankle weights B LE in parallel bars with CGA. Pt required verbal and tactile cuing for foot placement and maintaining upright posture. Provided cuing for glute activation and maintaining balance to decrease compensatory strategies and increase proper body mechanics.  Forwards & backwards walking (x 4): Performed with 2Ib ankle weights B LE at the parallel bar using a single point cane with supervision assist. Forwards walking performed outside of the parallel bars, backwards walking performed inside parallel bars. Pt used step through strategy when walking forward and step to walking backwards. Provided verbal cuing for upright posture.  Gait w/ picking up objects (x 3): Pt ambulated 12 ft  while picking up cones from various height/ levels. Performed with 2Ib ankle weight B LE using single point cane with supervision to [place] Provided verbal and tactile cuing to promote proper  body mechanics and decrease compensatory strategies when picking up cones.  Pt receptive to education and required occasional rest breaks throughout treatment session.Pt is receptive to instructions but can occasionally lose attention to task.  Pt ambulated back to room from therapy gym with 2lb ankle weights B LE  using a single point cane. Pt was encouraged to pick up feet and maintain upright posture to reduce compensatory strategies during ambulation.  Pt supine in bed with alarm and all needs met.  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

## 2022-02-01 NOTE — Progress Notes (Signed)
Physical Therapy Session Note  Patient Details  Name: Randy [name] MRN: [id number] Date of Birth: [birth year]  Today's Date: 02/01/2022 PT Individual Time: 1302-1400 PT Individual Time Calculation (min): 58 min   Short Term Goals: Week 1:  PT Short Term Goal 1 (Week 1): Pt will perform all transfers with superision assist PT Short Term Goal 2 (Week 1): Pt will improve Berg balance score >45 to indicate reduced fall risk PT Short Term Goal 3 (Week 1): Pt will ambulate 133ft with supersion assist with LRAD  Skilled Therapeutic Interventions/Progress Updates:  Patient supine in bed on entrance to room. Patient alert and agreeable to PT session.   Patient with no pain complaint at start of session.  Therapeutic Activity: Bed Mobility: Pt performed supine <> sit with distant supervision. No cueing required for technique or effort.  Transfers: Pt performed sit<>stand and stand pivot transfers throughout session with supervision. Provided verbal cues for safe brake application on w/c.  Pt transported to entrance of [facility] MaxA in w/c.   Gait Training:  Pt ambulated around uneven patio, down/ up ramped sidewalk with no AD and up/ down 4 steps using R handrail with close supervision/ intermittent CGA. Demonstrated improved foot clearance on LLE despite relating initially that he continues to require vc from everyone to increase foot clearance when walking.  Ambulates up/ down entire circle of ramped entry with close supervision. Up/ down curb step with no AD and close supervision. Provided vc for safe curb navigation.  Patient seated upright in recliner at end of session with brakes locked, belt alarm set, and all needs within reach. Very appreciative of time spent outdoors.   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   Therapy/Group: Individual Therapy  Alger Simons PT, DPT, CSRS 02/01/2022, 2:02 PM

## 2022-02-01 NOTE — Progress Notes (Signed)
Occupational Therapy Session Note  Patient Details  Name: Randy [name] MRN: [id number] Date of Birth: 29-Nov-1950  Today's Date: 02/01/2022 OT Individual Time: 1050-1205 OT Individual Time Calculation (min): 75 min    Short Term Goals: Week 1:  OT Short Term Goal 1 (Week 1): Pt will perform toilet transfer with LRAD with Supervision OT Short Term Goal 2 (Week 1): Pt will perform 3/3 toileting tasks with Supervision OT Short Term Goal 3 (Week 1): Pt will perform UB/LB dress with supervision using hemitechniques OT Short Term Goal 4 (Week 1): Pt will improve LUE to grasp/release and translate 4/4 items  Skilled Therapeutic Interventions/Progress Updates:    Pt received in bed with wife present for first part of session.  Reviewed with wife the L inattention pt occasionally demonstrated and his occasional impulsivity with movements.  His wife said he has often been this way in the past, moving too quickly and trying to be more physical than he should. She clarified that he had all DME, including a  tub bench that has been set up.  Pt got out of bed and retrieved clothing and then ambulated to sink to stand and shave. He was able to stand with S for over 15 minutes. He then continued to bathroom with close S.  His wife had to leave at this point.  Pt completed shower, dressing and toileting with distant S. He continues to need cues to fully scoot into bench so L hip not so close to the edge and cues to fully pull pants over L side as he often does not fully pull them up by 2 inches.   Pt then used SPC to ambulate with close S to main gym to work with L shoulder on finger ladder 4x achieving full ROM.  He then ambulated to ortho gym to work on standing arm bike at resistance 10. Pt able to do 30 sec for 5 cycles working on L shoulder strength and balance. When stepping around platform of standing arm bike, he needed CGA as his L foot caught edge of platform.  Cued pt to be more aware of L side and  footing.  Standing at [facility] working on L visual scanning. Pt did well with these activities not demonstrating any deficits.  Pt ambulated back to room and chose to sit EOB for lunch. Bed alarm on and all needs met.  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 Restrictions Weight Bearing Restrictions: No    Vital Signs: Therapy Vitals Temp: 98.4 F (36.9 C) Temp Source: Oral Pulse Rate: (Abnormal) 56 Resp: 17 BP: 129/69 Patient Position (if appropriate): Lying Oxygen Therapy SpO2: 95 % O2 Device: Room Air Pain: Pain Assessment Pain Scale: 0-10 Pain Score: 0-No pain ADL: ADL Eating: Independent Grooming: Independent Where Assessed-Grooming: Standing at sink Upper Body Bathing: Supervision/safety Where Assessed-Upper Body Bathing: Shower Lower Body Bathing: Supervision/safety Where Assessed-Lower Body Bathing: Shower Upper Body Dressing: Setup Lower Body Dressing: Supervision/safety Where Assessed-Lower Body Dressing: Edge of bed Toileting: Supervision/safety Where Assessed-Toileting: Glass blower/designer: Close supervision Toilet Transfer Method: Counselling psychologist: Energy manager: Close supervision Social research officer, government Method: Heritage manager: Radio broadcast assistant      Therapy/Group: Individual Therapy  Wilfred Dayrit 02/01/2022, 8:57 AM

## 2022-02-01 NOTE — Plan of Care (Signed)
  Problem: RH Tub/Shower Transfers Goal: LTG Patient will perform tub/shower transfers w/assist (OT) Description: LTG: Patient will perform tub/shower transfers with assist, with/without cues using equipment (OT) Flowsheets (Taken 02/01/2022 1249) LTG: Pt will perform tub/shower stall transfers with assistance level of: (downgraded due to L inattention defecits) Supervision/Verbal cueing Note: downgraded due to L inattention defecits

## 2022-02-02 NOTE — Progress Notes (Signed)
Physical Therapy Session Note  Patient Details  Name: Randy [name] MRN: [id number] Date of Birth: July 25, 1950  Today's Date: 02/02/2022 PT Individual Time: 1020-1115 PT Individual Time Calculation (min): 55 min   Short Term Goals: Week 1:  PT Short Term Goal 1 (Week 1): Pt will perform all transfers with superision assist PT Short Term Goal 2 (Week 1): Pt will improve Berg balance score >45 to indicate reduced fall risk PT Short Term Goal 3 (Week 1): Pt will ambulate 144ft with supersion assist with LRAD   Skilled Therapeutic Interventions/Progress Updates:  Patient supine in bed on entrance to room. Patient alert and agreeable to PT session. Requests again to shower in order to get dressed for day.   Patient with no pain complaint at start of session.  Ambulatory transfer into bathroom and furniture walking with CGA. VC for upright posture and focus on LLE with no UE support. Pt states that he is just used to reaching out to steady self. Stand pivot transfers throughout session with supervision and pt reaching out for hand holds. Provided verbal cues for trusting balance and decreasing reach for UE support.   Pt able to place towels on floor for shower, retrieve all soap and washcloths for bathing with supervision. Showers using bench seat with distant supervision. Dons underwear with Mod I and   Therapeutic Activity: Bed Mobility: Pt performed supine <> sit with Mod I and no need for cueing to complete.  Transfers: Pt performed sit<>stand and stand pivot transfers throughout session with close supervision/ distant supervision and no AD. Initially reaches out for handholds on available furniture, however cued to improve posture and keep hands at sides to ambulate and transfer with no UE support as pt has demonstrated ability to do so. Reaches sink to shave head, then face and brush teeth all while sseated in w/c. Ambulatory transfer into bathroom and furniture walking with close  supervision. VC for upright posture and focus on LLE with no UE support. Stand pivot transfers throughout session with supervision and pt reaching out for hand holds. Provided verbal cues for trusting balance and decreasing reach for UE support.  Pt able to place towels on floor for shower, retrieve all soap and washcloths for bathing with supervision. Showers using bench seat with distant supervision. Dons underwear with Mod I and ambulates back to bedside in order to don rest of clothes. Performed with Mod I except for MaxA with socks as pt is unable to flex forward to place socks over toes.   Patient supine in bed at end of session with brakes locked, bed alarm set, and all needs within reach.   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    Therapy/Group: Individual Therapy  Alger Simons PT, DPT, CSRS 02/02/2022, 1:57 PM

## 2022-02-03 NOTE — Progress Notes (Signed)
Good night rested quietly without complaint of. Randy [name]

## 2022-02-03 NOTE — Progress Notes (Signed)
Physical Therapy Session Note  Patient Details  Name: Randy [name] MRN: [id number] Date of Birth: 07-May-1950  Today's Date: 02/03/2022 PT Individual Time: 1010-1051 PT Individual Time Calculation (min): 41 min   Short Term Goals: Week 1:  PT Short Term Goal 1 (Week 1): Pt will perform all transfers with superision assist PT Short Term Goal 2 (Week 1): Pt will improve Berg balance score >45 to indicate reduced fall risk PT Short Term Goal 3 (Week 1): Pt will ambulate 173ft with supersion assist with LRAD  Skilled Therapeutic Interventions/Progress Updates:    Pt received supine in bed and agreeable to therapy session. Supine>sitting L EOB, HOB flat and not using bedrail, mod-I. Sitting EOB donned tennis shoes set-up assist. Sit<>stands using SPC supervision progressed to mod-I throughout session.  Gait training ~135ft x2 to/from main therapy gym using [place] in R UE support with close supervision for safety but no significant instability noted and no LOBs - pt demos correct sequencing of AD to achieve reciprocal stepping pattern without cuing.  Therapist provided pt with the below HEP printout and educated pt on importance of performing it daily upon D/C. Pt performed 1 set of each exercise with therapist providing verbal, visual, and tactile cuing for proper form/technique.  Access Code: [email or web address] URL: [email or web address] Date: 02/03/2022 Prepared by: Page Spiro  Exercises - Standing Hip Abduction with Counter Support  - 1 x daily - 7 x weekly - 2 sets - 20 reps - Side Stepping with Resistance at Thighs and Counter Support  - 1 x daily - 7 x weekly - 2 sets - 10 reps - Heel Raises with Counter Support  - 1 x daily - 7 x weekly - 2 sets - 20 reps - Backward Walking with Counter Support  - 1 x daily - 7 x weekly - 2 sets - 10 reps - Sit to Stand with Counter Support  - 1 x daily - 7 x weekly - 2 sets - 10 reps  During exercises, pt reports urgent need to use bathroom.  Gait back to room as described above. Gait in/out bathroom using SPC supervision/mod-I. Pt completed 3/3 toileting tasks without assistance - continent of bladder and bowels.   At end of session, pt left seated in recliner with needs in reach and seat belt alarm on.  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    Therapy/Group: Individual Therapy  Tawana Scale , PT, DPT, NCS, CSRS 02/03/2022, 7:51 AM

## 2022-02-03 NOTE — Progress Notes (Signed)
Occupational Therapy Session Note  Patient Details  Name: Randy [name] MRN: [id number] Date of Birth: 06/05/1950  Today's Date: 02/03/2022 OT Individual Time: 1400-1455 OT Individual Time Calculation (min): 55 min    Short Term Goals: Week 1:  OT Short Term Goal 1 (Week 1): Pt will perform toilet transfer with LRAD with Supervision OT Short Term Goal 2 (Week 1): Pt will perform 3/3 toileting tasks with Supervision OT Short Term Goal 3 (Week 1): Pt will perform UB/LB dress with supervision using hemitechniques OT Short Term Goal 4 (Week 1): Pt will improve LUE to grasp/release and translate 4/4 items  Skilled Therapeutic Interventions/Progress Updates:   Pt in bed upon OT arrival and agreeable to all activity presented. Strong focus on maximizing indep level in preparation for discharge home early next week. Baseline HR 70 bpm and varied only within 70-75 bpm throughout session. Pt performed sink side oral care, UB sponge bathing and deoderant application in standing as pt reported he wanted to defer a shower as he tollk every day and was giving himself a "Sunday break". Pt was able to don tie up tennis shoes seated at EOB indep. Amb to middle gym with S with SPC to retrieve shopping cart to simulate community shopping outing. Amb to far bridge waiting area ~ 300 ft from room to chair with seated rest and no report of SOB or pain. OT educated on scenarios if in the community and needed a rest break as well as joint protection and energy conservation with + teach back and understanding. Amb back to replace cart and moved to demo apt space for simple item retrieval for snack in pantry and fridge. Required S overall to complete light meal prep simulation. Amb with S back to room where wife was sititng bedside. OT educated in simple UE cane ex in standing for HEP carryover. Both with no questions or concerns and felt prepared for final therapy sessions tomorrow to complete services for d/c Tuesday.  Left pt EOB with bed exit engaged, needs and call button within reach.   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

## 2022-02-03 NOTE — Progress Notes (Signed)
PROGRESS NOTE   Subjective/Complaints:  Feeling well today, showing pictures of family   ROS:  Pt denies SOB, abd pain, CP, N/V/C/D, and vision changes   Objective:   No results found. No results for input(s): "WBC", "HGB", "HCT", "PLT" in the last 72 hours.  No results for input(s): "NA", "K", "CL", "CO2", "GLUCOSE", "BUN", "CREATININE", "CALCIUM" in the last 72 hours.   Intake/Output Summary (Last 24 hours) at 02/03/2022 0900 Last data filed at 02/03/2022 0746 Gross per 24 hour  Intake 476 ml  Output 950 ml  Net -474 ml         Physical Exam: Vital Signs Blood pressure 139/83, pulse (!) 58, temperature 97.9 F (36.6 C), temperature source Oral, resp. rate 18, height 5\' 10"  (1.778 m), weight 95 kg, SpO2 97 %.   General: No acute distress Mood and affect are appropriate Heart: Regular rate and rhythm no rubs murmurs or extra sounds Lungs: Clear to auscultation, breathing unlabored, no rales or wheezes Abdomen: Positive bowel sounds, soft nontender to palpation, nondistended Extremities: No clubbing, cyanosis, or edema   MSK:     No pain with Left knee ROM, no effusion or jt deformity no pain to palp in  popliteal area, over quad and patellar tendons or along joint lines       Strength:           RUE and RLE 5/5 Left UE 4+, LLE 4+    Neurologic exam:  Cognition: AAO to person, place, time and event.  Language: Fluent, No substitutions or neoglisms. Mild dysarthria.   Mood: Pleasant affect, appropriate mood.  Sensation: To light touch intact in BL UEs and LEs   Spasticity: MAS 0 in all extremities.   Assessment/Plan: 1. Functional deficits which require 3+ hours per day of interdisciplinary therapy in a comprehensive inpatient rehab setting. Physiatrist is providing close team supervision and 24 hour management of active medical problems listed below. Physiatrist and rehab team continue to assess  barriers to discharge/monitor patient progress toward functional and medical goals  Care Tool:  Bathing    Body parts bathed by patient: Right arm, Left arm, Chest, Abdomen, Front perineal area, Right upper leg, Left upper leg, Face, Buttocks, Left lower leg, Right lower leg   Body parts bathed by helper: Buttocks, Right lower leg, Left lower leg     Bathing assist Assist Level: Supervision/Verbal cueing     Upper Body Dressing/Undressing Upper body dressing   What is the patient wearing?: Pull over shirt    Upper body assist Assist Level: Supervision/Verbal cueing    Lower Body Dressing/Undressing Lower body dressing      What is the patient wearing?: Underwear/pull up, Pants     Lower body assist Assist for lower body dressing: Supervision/Verbal cueing     Toileting Toileting    Toileting assist Assist for toileting: Supervision/Verbal cueing     Transfers Chair/bed transfer  Transfers assist     Chair/bed transfer assist level: Supervision/Verbal cueing     Locomotion Ambulation   Ambulation assist      Assist level: Minimal Assistance - Patient > 75% Assistive device: No Device Max distance: 141ft   Walk  10 feet activity   Assist     Assist level: Minimal Assistance - Patient > 75% Assistive device: No Device   Walk 50 feet activity   Assist    Assist level: Minimal Assistance - Patient > 75% Assistive device: No Device    Walk 150 feet activity   Assist    Assist level: Minimal Assistance - Patient > 75% Assistive device: No Device    Walk 10 feet on uneven surface  activity   Assist     Assist level: Minimal Assistance - Patient > 75% Assistive device: Other (comment) (rail up ramp)   Wheelchair     Assist Is the patient using a wheelchair?: Yes Type of Wheelchair: Manual    Wheelchair assist level: Minimal Assistance - Patient > 75% Max wheelchair distance: 157ft    Wheelchair 50 feet with 2 turns  activity    Assist        Assist Level: Minimal Assistance - Patient > 75%   Wheelchair 150 feet activity     Assist      Assist Level: Minimal Assistance - Patient > 75%   Blood pressure 139/83, pulse (!) 58, temperature 97.9 F (36.6 C), temperature source Oral, resp. rate 18, height 5\' 10"  (1.778 m), weight 95 kg, SpO2 97 %.  Medical Problem List and Plan: 1. 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  Plan 30-day cardiac event monitor on discharge             -patient may shower             -ELOS/Goals: 10/31               Con't CIR- - PT/OT and SLP, 2.  Antithrombotics: -DVT/anticoagulation:  Pharmaceutical: Heparin             -antiplatelet therapy: Aspirin 81 mg daily and Plavix 75 mg day x3 weeks then Plavix alone (per neurology consult note; DC summary lists as continue ASA alone).    3. Pain Management: Tylenol PRN, Oxycodone 5 mg every 6 hours as needed     10/21- denies pain- con't regimen prn 4. Mood/Behavior/Sleep: Cymbalta 30 mg daily, Paxil 5 mg nightly.  Provide emotional support             -antipsychotic agents: N/A   5. Neuropsych/cognition: This patient is capable of making decisions on his own behalf.   6. Skin/Wound Care: Routine skin checks   7. Fluids/Electrolytes/Nutrition: Routine in and outs with follow-up chemistries.   8.  Permissive hypertension (up to 220/110, normotension goal at 5-7 days).  Coreg 12.5 mg twice daily, lisinopril 20 mg daily.  Monitor with increased mobility    Vitals:   02/02/22 1938 02/03/22 0450  BP: (!) 150/49 139/83  Pulse: 61 (!) 58  Resp: 18 18  Temp: 98.3 F (36.8 C) 97.9 F (36.6 C)  SpO2: 96% 97%  Controlled 10/29  9.  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  Monitor for any signs of fluid overload.  Follow-up outpatient cardiology services   10.  History of alcohol and tobacco use (half ppd).  NicoDerm patch.  Provide counseling.  Monitor  for any signs of withdrawal.               - + chronic cough, likely underlying chronic lung dz; add PRN duonebs   10/21- no cough heard this AM-con't regimen 11.  Hyperlipidemia.  Lipitor   12.  BPH.  Check PVR.  Patient  on Hytrin 4 mg nightly prior to admission held for permissive hypertension.  Resume as needed   13. Rosacea. Takes OTC cream at home; advised wife bring in and can add do med list.    14. Constipation. Having 2x BM yesterday but endorsing hard/difficult to pass stools.         - Start Miralax daily + Sennakot-S PRN   Last BM 10/28  15. OSA: noncompliant with home CPAP. Monitor vitals.  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

## 2022-02-04 ENCOUNTER — Other Ambulatory Visit (HOSPITAL_COMMUNITY): Payer: Self-pay

## 2022-02-04 MED ORDER — ATORVASTATIN CALCIUM 80 MG PO TABS
40.0000 mg | ORAL_TABLET | Freq: Every day | ORAL | 0 refills | Status: DC
  Filled 2022-02-04: qty 30, 60d supply, fill #0

## 2022-02-04 MED ORDER — VITAMIN D 50 MCG (2000 UT) PO TABS
2000.0000 [IU] | ORAL_TABLET | Freq: Every day | ORAL | 0 refills | Status: DC
  Filled 2022-02-04: qty 30, 30d supply, fill #0

## 2022-02-04 MED ORDER — VITAMIN D 50 MCG (2000 UT) PO TABS: 2000.0000 [IU] | ORAL_TABLET | Freq: Every day | ORAL | 0 refills | Status: AC

## 2022-02-04 MED ORDER — OXYCODONE HCL 5 MG PO TABS: 5.0000 mg | ORAL_TABLET | Freq: Four times a day (QID) | ORAL | 0 refills | Status: DC | PRN

## 2022-02-04 MED ORDER — CLOPIDOGREL BISULFATE 75 MG PO TABS: 75.0000 mg | ORAL_TABLET | Freq: Every day | ORAL | 0 refills | Status: AC

## 2022-02-04 MED ORDER — PAROXETINE HCL 10 MG PO TABS: 5.0000 mg | ORAL_TABLET | Freq: Every day | ORAL | 0 refills | Status: AC

## 2022-02-04 MED ORDER — DULOXETINE HCL 30 MG PO CPEP
30.0000 mg | ORAL_CAPSULE | Freq: Every day | ORAL | 0 refills | Status: DC
  Filled 2022-02-04: qty 30, 30d supply, fill #0

## 2022-02-04 MED ORDER — CARVEDILOL 12.5 MG PO TABS
12.5000 mg | ORAL_TABLET | Freq: Two times a day (BID) | ORAL | 0 refills | Status: DC
  Filled 2022-02-04: qty 60, 30d supply, fill #0

## 2022-02-04 MED ORDER — PAROXETINE HCL 10 MG PO TABS
5.0000 mg | ORAL_TABLET | Freq: Every day | ORAL | 0 refills | Status: DC
  Filled 2022-02-04: qty 30, 60d supply, fill #0

## 2022-02-04 MED ORDER — NICOTINE 21 MG/24HR TD PT24
MEDICATED_PATCH | TRANSDERMAL | 0 refills | Status: DC
  Filled 2022-02-04: qty 28, 14d supply, fill #0

## 2022-02-04 MED ORDER — ACETAMINOPHEN 325 MG PO TABS: 650.0000 mg | ORAL_TABLET | ORAL | Status: DC | PRN

## 2022-02-04 MED ORDER — LISINOPRIL 40 MG PO TABS: 20.0000 mg | ORAL_TABLET | Freq: Every day | ORAL | 0 refills | Status: AC

## 2022-02-04 MED ORDER — FOLIC ACID 1 MG PO TABS
1.0000 mg | ORAL_TABLET | Freq: Every day | ORAL | 0 refills | Status: DC
  Filled 2022-02-04: qty 30, 30d supply, fill #0

## 2022-02-04 MED ORDER — LISINOPRIL 40 MG PO TABS
20.0000 mg | ORAL_TABLET | Freq: Every day | ORAL | 0 refills | Status: DC
  Filled 2022-02-04: qty 30, 60d supply, fill #0

## 2022-02-04 MED ORDER — OXYCODONE HCL 5 MG PO TABS
5.0000 mg | ORAL_TABLET | Freq: Four times a day (QID) | ORAL | 0 refills | Status: DC | PRN
  Filled 2022-02-04: qty 15, 4d supply, fill #0

## 2022-02-04 MED ORDER — OMEPRAZOLE 20 MG PO CPDR: 20.0000 mg | DELAYED_RELEASE_CAPSULE | Freq: Every day | ORAL | 0 refills | Status: AC

## 2022-02-04 MED ORDER — CARVEDILOL 12.5 MG PO TABS: 12.5000 mg | ORAL_TABLET | Freq: Two times a day (BID) | ORAL | 0 refills | Status: AC

## 2022-02-04 MED ORDER — DULOXETINE HCL 30 MG PO CPEP: 30.0000 mg | ORAL_CAPSULE | Freq: Every day | ORAL | 0 refills | Status: AC

## 2022-02-04 MED ORDER — CLOPIDOGREL BISULFATE 75 MG PO TABS
75.0000 mg | ORAL_TABLET | Freq: Every day | ORAL | 0 refills | Status: DC
  Filled 2022-02-04: qty 30, 30d supply, fill #0

## 2022-02-04 MED ORDER — ASPIRIN 81 MG PO TBEC: 81.0000 mg | DELAYED_RELEASE_TABLET | Freq: Every day | ORAL | 12 refills | Status: DC

## 2022-02-04 MED ORDER — NICOTINE 21 MG/24HR TD PT24: MEDICATED_PATCH | TRANSDERMAL | 0 refills | Status: DC

## 2022-02-04 MED ORDER — ATORVASTATIN CALCIUM 80 MG PO TABS: 40.0000 mg | ORAL_TABLET | Freq: Every day | ORAL | 0 refills | Status: DC

## 2022-02-04 MED ORDER — FOLIC ACID 1 MG PO TABS: 1.0000 mg | ORAL_TABLET | Freq: Every day | ORAL | 0 refills | Status: AC

## 2022-02-04 MED ORDER — POLYETHYLENE GLYCOL 3350 17 G PO PACK: 17.0000 g | PACK | Freq: Every day | ORAL | 0 refills | Status: AC

## 2022-02-04 MED ORDER — OMEPRAZOLE 20 MG PO CPDR
20.0000 mg | DELAYED_RELEASE_CAPSULE | Freq: Every day | ORAL | 0 refills | Status: DC
  Filled 2022-02-04: qty 30, 30d supply, fill #0

## 2022-02-04 NOTE — Plan of Care (Signed)
  Problem: RH Balance Goal: LTG Patient will maintain dynamic standing with ADLs (OT) Description: LTG:  Patient will maintain dynamic standing balance with assist during activities of daily living (OT)  Outcome: Completed/Met   Problem: Sit to Stand Goal: LTG:  Patient will perform sit to stand in prep for activites of daily living with assistance level (OT) Description: LTG:  Patient will perform sit to stand in prep for activites of daily living with assistance level (OT) Outcome: Completed/Met   Problem: RH Grooming Goal: LTG Patient will perform grooming w/assist,cues/equip (OT) Description: LTG: Patient will perform grooming with assist, with/without cues using equipment (OT) Outcome: Completed/Met   Problem: RH Bathing Goal: LTG Patient will bathe all body parts with assist levels (OT) Description: LTG: Patient will bathe all body parts with assist levels (OT) Outcome: Completed/Met   Problem: RH Dressing Goal: LTG Patient will perform upper body dressing (OT) Description: LTG Patient will perform upper body dressing with assist, with/without cues (OT). Outcome: Completed/Met Goal: LTG Patient will perform lower body dressing w/assist (OT) Description: LTG: Patient will perform lower body dressing with assist, with/without cues in positioning using equipment (OT) Outcome: Completed/Met   Problem: RH Toileting Goal: LTG Patient will perform toileting task (3/3 steps) with assistance level (OT) Description: LTG: Patient will perform toileting task (3/3 steps) with assistance level (OT)  Outcome: Completed/Met   Problem: RH Functional Use of Upper Extremity Goal: LTG Patient will use RT/LT upper extremity as a (OT) Description: LTG: Patient will use right/left upper extremity as a stabilizer/gross assist/diminished/nondominant/dominant level with assist, with/without cues during functional activity (OT) Outcome: Completed/Met   Problem: RH Toilet Transfers Goal: LTG Patient  will perform toilet transfers w/assist (OT) Description: LTG: Patient will perform toilet transfers with assist, with/without cues using equipment (OT) Outcome: Completed/Met   Problem: RH Tub/Shower Transfers Goal: LTG Patient will perform tub/shower transfers w/assist (OT) Description: LTG: Patient will perform tub/shower transfers with assist, with/without cues using equipment (OT) Outcome: Completed/Met

## 2022-02-04 NOTE — Progress Notes (Addendum)
Patient ID: Randy [name], male   DOB: 10/08/1950, 71 y.o.   MRN: [id number]  [facility] referral sent to [facility] for review.  Patient approved by Caryl Pina.  Orders sent to [facility] [place] requested auth from [facility] 10/30.  Orders for [facility] sent to PCP on 10/27.

## 2022-02-04 NOTE — Progress Notes (Signed)
Occupational Therapy Session Note  Patient Details  Name: Randy [name] MRN: [id number] Date of Birth: 1950-08-30  Today's Date: 02/04/2022 OT Individual Time: 7322-0254 OT Individual Time Calculation (min): 71 min    Short Term Goals: Week 1:  OT Short Term Goal 1 (Week 1): Pt will perform toilet transfer with LRAD with Supervision OT Short Term Goal 2 (Week 1): Pt will perform 3/3 toileting tasks with Supervision OT Short Term Goal 3 (Week 1): Pt will perform UB/LB dress with supervision using hemitechniques OT Short Term Goal 4 (Week 1): Pt will improve LUE to grasp/release and translate 4/4 items  Skilled Therapeutic Interventions/Progress Updates:  Pt greeted in shower with NT present,pt  agreeable to OT intervention. Session focus on BADL reeducation, functional mobility, dynamic standing balance, community mobility, increasing overall activity tolerance  and decreasing overall caregiver burden.      Pt completed bathing from shower seat with overall set- up assist. Pt exited shower with supervision and donned clothed MODI. Pt stood at sink for grooming MODI to complete oral care.  Pt completed functional ambulation down to gift shop with [name] in RUE with supervision. Pt able to demo reaching out of BOS in gift shop to retrieve items in [name] shelf and below knee level with no LOB. Pt with good safety awareness during simulated shopping tasks and reports when he is at [name] he would walk with shopping cart to increase balance support and promote energy conservation.  Pt also able to demo functional ambulation in food court setting to simulate mobility in restaurant, pt able to slide into booth with no LOB with supervision. Pt reports he wouldn't try to sit in tall ball stools in a real restaurant and would ask for a different table for safety.  Pt able to demo improved activity tolerance with pt able to ambulate outside with [name] across uneven surface and around obstacles with overall  supervision, pt took appropriate rest breaks on bench when needed.  Pt ambulated back to room with [name] with same level of assist. Pt able to assist OTA with donning new bed sheets on bed MODI,  pt left seated EOB with all needs within reach and bed alarm activated.                 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

## 2022-02-04 NOTE — Progress Notes (Signed)
Physical Therapy Session Note  Patient Details  Name: Randy [name] MRN: [id number] Date of Birth: 03-Jan-1951  Today's Date: 02/04/2022 PT Individual Time: 1303-1400 PT Individual Time Calculation (min): 57 min   Short Term Goals: Week 1:  PT Short Term Goal 1 (Week 1): Pt will perform all transfers with superision assist PT Short Term Goal 2 (Week 1): Pt will improve Berg balance score >45 to indicate reduced fall risk PT Short Term Goal 3 (Week 1): Pt will ambulate 138ft with supersion assist with LRAD Week 2:     Skilled Therapeutic Interventions/Progress Updates:      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 Restrictions Weight Bearing Restrictions: No General:   Vital Signs: Therapy Vitals Temp: 97.8 F (36.6 C) Temp Source: Oral Pulse Rate: (!) 56 Resp: 18 BP: 133/76 Patient Position (if appropriate): Lying Oxygen Therapy SpO2: 94 % O2 Device: Room Air Pain: Pain Assessment Pain Scale: 0-10 Pain Score: 3  Pain Type: Acute pain Pain Location: Generalized Pain Orientation: Left Pain Descriptors / Indicators: Aching Pain Frequency: Intermittent Pain Onset: Gradual Patients Stated Pain Goal: 0 Pain Intervention(s): Medication (See eMAR) Multiple Pain Sites: No Mobility:   Locomotion :    Trunk/Postural Assessment :    Balance:   Exercises:   Other Treatments:      Therapy/Group: Individual Therapy  Alger Simons PT, DPT, CSRS 02/04/2022, 8:20 AM

## 2022-02-04 NOTE — Progress Notes (Signed)
PROGRESS NOTE   Subjective/Complaints:  Patient not in room this AM. No reported issues overnight per nursing.   ROS: Unable to obtain d/t patient not in room.    Objective:   No results found. No results for input(s): "WBC", "HGB", "HCT", "PLT" in the last 72 hours.  No results for input(s): "NA", "K", "CL", "CO2", "GLUCOSE", "BUN", "CREATININE", "CALCIUM" in the last 72 hours.   Intake/Output Summary (Last 24 hours) at 02/04/2022 1249 Last data filed at 02/04/2022 0725 Gross per 24 hour  Intake 1071 ml  Output 1650 ml  Net -579 ml         Physical Exam: Vital Signs Blood pressure 133/76, pulse (!) 56, temperature 97.8 F (36.6 C), temperature source Oral, resp. rate 18, height 5\' 10"  (1.778 m), weight 91.9 kg, SpO2 94 %.  From prior exams:  General: No acute distress Mood and affect are appropriate Heart: Regular rate and rhythm no rubs murmurs or extra sounds Lungs: Clear to auscultation, breathing unlabored, no rales or wheezes Abdomen: Positive bowel sounds, soft nontender to palpation, nondistended Extremities: No clubbing, cyanosis, or edema   MSK:     No pain with Left knee ROM, no effusion or jt deformity no pain to palp in  popliteal area, over quad and patellar tendons or along joint lines       Strength:           RUE and RLE 5/5 Left UE 4+, LLE 4+    Neurologic exam:  Cognition: AAO to person, place, time and event.  Language: Fluent, No substitutions or neoglisms. Mild dysarthria.   Mood: Pleasant affect, appropriate mood.  Sensation: To light touch intact in BL UEs and LEs   Spasticity: MAS 0 in all extremities.   Assessment/Plan: 1. Functional deficits which require 3+ hours per day of interdisciplinary therapy in a comprehensive inpatient rehab setting. Physiatrist is providing close team supervision and 24 hour management of active medical problems listed below. Physiatrist and  rehab team continue to assess barriers to discharge/monitor patient progress toward functional and medical goals  Care Tool:  Bathing    Body parts bathed by patient: Right arm, Left arm, Chest, Abdomen, Front perineal area, Right upper leg, Left upper leg, Face, Buttocks, Left lower leg, Right lower leg   Body parts bathed by helper: Buttocks, Right lower leg, Left lower leg     Bathing assist Assist Level: Supervision/Verbal cueing     Upper Body Dressing/Undressing Upper body dressing   What is the patient wearing?: Pull over shirt    Upper body assist Assist Level: Supervision/Verbal cueing    Lower Body Dressing/Undressing Lower body dressing      What is the patient wearing?: Underwear/pull up, Pants     Lower body assist Assist for lower body dressing: Supervision/Verbal cueing     Toileting Toileting    Toileting assist Assist for toileting: Supervision/Verbal cueing     Transfers Chair/bed transfer  Transfers assist     Chair/bed transfer assist level: Supervision/Verbal cueing Chair/bed transfer assistive device: Research officer, political party   Ambulation assist      Assist level: Supervision/Verbal cueing Assistive device: Cane-straight Max distance:  141ft   Walk 10 feet activity   Assist     Assist level: Supervision/Verbal cueing Assistive device: Cane-straight   Walk 50 feet activity   Assist    Assist level: Supervision/Verbal cueing Assistive device: Cane-straight    Walk 150 feet activity   Assist    Assist level: Supervision/Verbal cueing Assistive device: Cane-straight    Walk 10 feet on uneven surface  activity   Assist     Assist level: Minimal Assistance - Patient > 75% Assistive device: Other (comment) (rail up ramp)   Wheelchair     Assist Is the patient using a wheelchair?: Yes Type of Wheelchair: Manual    Wheelchair assist level: Minimal Assistance - Patient > 75% Max wheelchair distance:  175ft    Wheelchair 50 feet with 2 turns activity    Assist        Assist Level: Minimal Assistance - Patient > 75%   Wheelchair 150 feet activity     Assist      Assist Level: Minimal Assistance - Patient > 75%   Blood pressure 133/76, pulse (!) 56, temperature 97.8 F (36.6 C), temperature source Oral, resp. rate 18, height 5\' 10"  (1.778 m), weight 91.9 kg, SpO2 94 %.  Medical Problem List and Plan: 1. 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  Plan 30-day cardiac event monitor on discharge             -patient may shower             -ELOS/Goals: 10/31               Con't CIR- - PT/OT and SLP, 2.  Antithrombotics: -DVT/anticoagulation:  Pharmaceutical: Heparin             -antiplatelet therapy: Aspirin 81 mg daily and Plavix 75 mg day x3 weeks then Plavix alone (per neurology consult note; DC summary lists as continue ASA alone).    3. Pain Management: Tylenol PRN, Oxycodone 5 mg every 6 hours as needed     10/21- denies pain- con't regimen prn 4. Mood/Behavior/Sleep: Cymbalta 30 mg daily, Paxil 5 mg nightly.  Provide emotional support             -antipsychotic agents: N/A   5. Neuropsych/cognition: This patient is capable of making decisions on his own behalf.   6. Skin/Wound Care: Routine skin checks   7. Fluids/Electrolytes/Nutrition: Routine in and outs with follow-up chemistries.                  - 10/30: No labs this Am; will order for tomorrow    8.  Permissive hypertension (up to 220/110, normotension goal at 5-7 days).  Coreg 12.5 mg twice daily, lisinopril 20 mg daily.  Monitor with increased mobility    Vitals:   02/03/22 1915 02/04/22 0445  BP: 117/71 133/76  Pulse: (!) 59 (!) 56  Resp: 18 18  Temp: 97.9 F (36.6 C) 97.8 F (36.6 C)  SpO2: 96% 94%  Controlled 10/29-30  9.  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  Monitor for any signs of fluid overload.  Follow-up outpatient  cardiology services Filed Weights   02/02/22 0555 02/03/22 0514 02/04/22 0511  Weight: 95.1 kg 95 kg 91.9 kg                      - Daily weights stable/downtrending    10.  History of alcohol and tobacco use (half ppd).  NicoDerm  patch.  Provide counseling.  Monitor for any signs of withdrawal.               - + chronic cough, likely underlying chronic lung dz; add PRN duonebs   10/21- no cough heard this AM-con't regimen 11.  Hyperlipidemia.  Lipitor   12.  BPH.  Check PVR.  Patient on Hytrin 4 mg nightly prior to admission held for permissive hypertension.  Resume as needed   13. Rosacea. Takes OTC cream at home; advised wife bring in and can add do med list.    14. Constipation. Having 2x BM yesterday but endorsing hard/difficult to pass stools.         - Start Miralax daily + Sennakot-S PRN   Last BM 10/28  15. OSA: noncompliant with home CPAP. Monitor vitals.  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

## 2022-02-04 NOTE — Progress Notes (Signed)
Occupational Therapy Discharge Summary  Patient Details  Name: Randy [name] MRN: [id number] Date of Birth: 06-15-1950  Date of Discharge from [place] [facility], 2023  Today's Date: 02/04/2022 OT Individual Time: 1100-1200 OT Individual Time Calculation (min): 60 min    Pt seen this session to focus on balance, coordination and L shoulder AROM. Pt used SPC to ambulate to gym.  Pt engaged in L shoulder AROM using cane on floor with forward reaching to use as a MAS to support arm, cane up on mat for increased shoulder range for both sh flexion and abduction with cane out to the side.  Pt then held cane in B hands and pushed into a chest press and then slight sh flexion to 100.  Standing at wall with wall slides. Dynamic balance with using 1 lb ball with rebounder with no difficulty and then the Biodex challenging his limits of stability. Pt needed cues to shift from hips and ankles vs leaning from his shoulders.  Pt ambulated with cane with no supervision needed.  He returned to room with all needs met.    Patient has met 10 of 10 long term goals due to improved activity tolerance, improved balance, postural control, ability to compensate for deficits, improved attention, improved awareness, and improved coordination.  Patient to discharge at overall Modified Independent level with supervision for shower transfers. Patient's care partner is independent to provide the necessary physical and cognitive assistance at discharge.  Family education with wife and pt completed.   Reasons goals not met: n/a  Recommendation:  No further OT services recommended at this time.  Equipment: No equipment provided - pt has all DME needs from previous back surgery  Reasons for discharge: treatment goals met  Patient/family agrees with progress made and goals achieved: Yes  OT Discharge Precautions/Restrictions   fall  Pain  No c/o pain during OT session  ADL ADL Eating: Independent Grooming:  Independent Where Assessed-Grooming: Standing at sink Upper Body Bathing: Setup Where Assessed-Upper Body Bathing: Shower Lower Body Bathing: Setup Where Assessed-Lower Body Bathing: Shower Upper Body Dressing: Independent Lower Body Dressing: Modified independent Where Assessed-Lower Body Dressing: Edge of bed Toileting: Modified independent Where Assessed-Toileting: Glass blower/designer: Diplomatic Services operational officer Method: Counselling psychologist: Energy manager: Close supervison Clinical cytogeneticist Method: Optometrist: Facilities manager: Close supervision Social research officer, government Method: Heritage manager: Gaffer Baseline Vision/History: 1 Wears glasses Patient Visual Report: No change from baseline Eye Alignment: Within Functional Limits Ocular Range of Motion: Within Functional Limits Tracking/Visual Pursuits: Able to track stimulus in all quads without difficulty Saccades: Within functional limits Visual Fields: No apparent deficits Perception  Perception: Impaired Inattention/Neglect: Does not attend to left side of body (minimal impairment noted during dressing) Praxis Praxis: Intact Cognition Cognition Overall Cognitive Status: Within Functional Limits for tasks assessed Arousal/Alertness: Awake/alert Orientation Level: Person;Place;Situation Person: Oriented Place: Oriented Situation: Oriented Memory: Appears intact Focused Attention: Appears intact Sustained Attention: Appears intact Selective Attention: Appears intact Awareness: Appears intact Safety/Judgment: Appears intact Brief Interview for Mental Status (BIMS) Repetition of Three Words (First Attempt): 3 Temporal Orientation: Year: Correct Temporal Orientation: Month: Accurate within 5 days Temporal Orientation: Day: Correct Recall: "Sock": Yes, no cue required Recall: "Blue":  Yes, no cue required Recall: "Bed": Yes, no cue required BIMS Summary Score: 15 Sensation Sensation Light Touch: Appears Intact Hot/Cold: Appears Intact Proprioception: Appears Intact Stereognosis: Appears Intact Coordination Gross Motor Movements are Fluid and  Coordinated: No Fine Motor Movements are Fluid and Coordinated: Yes Heel Shin Test: mild L sided ataxia Motor  Motor Motor - Discharge Observations: mild ataxia in LLE Mobility    Mod I with mobility with SPC during ADLs Trunk/Postural Assessment    Functional  Balance Static Sitting Balance Static Sitting - Level of Assistance: 7: Independent Dynamic Sitting Balance Dynamic Sitting - Level of Assistance: 7: Independent Static Standing Balance Static Standing - Level of Assistance: 7: Independent Dynamic Standing Balance Dynamic Standing - Level of Assistance: 6: Modified independent (Device/Increase time) Extremity/Trunk Assessment RUE Assessment RUE Assessment: Within Functional Limits LUE Assessment Active Range of Motion (AROM) Comments: improved from admission, sh flexion now 85 vs 30-40. 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

## 2022-02-04 NOTE — Progress Notes (Signed)
Inpatient Rehabilitation Care Coordinator Discharge Note   Patient Details  Name: Randy [name] MRN: [id number] Date of Birth: 1951-03-16   Discharge location: Home  Length of Stay: 11 Days  Discharge activity level: MODI/SUP  Home/community participation: Spouse  Patient response XI:[id number] Literacy - How often do you need to have someone help you when you read instructions, pamphlets, or other written material from your doctor or pharmacy?: Never  Patient response NL:ZJQBHA Isolation - How often do you feel lonely or isolated from those around you?: Never  Services provided included: MD, RD, PT, OT, SLP, RN, CM, TR, Pharmacy, SW  Financial Services:  Charity fundraiser Utilized: Risk manager  Choices offered to/list presented to: patient  Follow-up services arranged:  [place]: [facility]         Patient response to transportation need: Is the patient able to respond to transportation needs?: Yes In the past 12 months, has lack of transportation kept you from medical appointments or from getting medications?: No In the past 12 months, has lack of transportation kept you from meetings, work, or from getting things needed for daily living?: No    Comments (or additional information):  Patient/Family verbalized understanding of follow-up arrangements:  Yes  Individual responsible for coordination of the follow-up plan: self or spouse  Confirmed correct DME delivered: Dyanne Iha 02/04/2022    Dyanne Iha

## 2022-02-05 ENCOUNTER — Other Ambulatory Visit: Payer: Self-pay | Admitting: Cardiology

## 2022-02-05 DIAGNOSIS — I480 Paroxysmal atrial fibrillation: Secondary | ICD-10-CM

## 2022-02-05 DIAGNOSIS — I6350 Cerebral infarction due to unspecified occlusion or stenosis of unspecified cerebral artery: Secondary | ICD-10-CM

## 2022-02-05 LAB — CBC
HCT: 49 % (ref 39.0–52.0)
Hemoglobin: 17.3 g/dL — ABNORMAL HIGH (ref 13.0–17.0)
MCH: 32.8 pg (ref 26.0–34.0)
MCHC: 35.3 g/dL (ref 30.0–36.0)
MCV: 93 fL (ref 80.0–100.0)
Platelets: 233 10*3/uL (ref 150–400)
RBC: 5.27 MIL/uL (ref 4.22–5.81)
RDW: 13.4 % (ref 11.5–15.5)
WBC: 4.8 10*3/uL (ref 4.0–10.5)
nRBC: 0 % (ref 0.0–0.2)

## 2022-02-05 LAB — BASIC METABOLIC PANEL
Anion gap: 9 (ref 5–15)
BUN: 23 mg/dL (ref 8–23)
CO2: 27 mmol/L (ref 22–32)
Calcium: 10 mg/dL (ref 8.9–10.3)
Chloride: 102 mmol/L (ref 98–111)
Creatinine, Ser: 1.12 mg/dL (ref 0.61–1.24)
GFR, Estimated: >60 mL/min (ref >60)
Glucose, Bld: 127 mg/dL — ABNORMAL HIGH (ref 70–99)
Potassium: 4.4 mmol/L (ref 3.5–5.1)
Sodium: 138 mmol/L (ref 135–145)

## 2022-02-05 MED ORDER — ASPIRIN 81 MG PO TBEC: 81.0000 mg | DELAYED_RELEASE_TABLET | Freq: Every day | ORAL | 0 refills | Status: AC

## 2022-02-05 NOTE — Plan of Care (Signed)
  Problem: RH Balance Goal: LTG Patient will maintain dynamic standing balance (PT) Description: LTG:  Patient will maintain dynamic standing balance with assistance during mobility activities (PT) Outcome: Completed/Met Flowsheets (Taken 01/26/2022 1118 by Lorie Phenix, PT) LTG: Pt will maintain dynamic standing balance during mobility activities with:: Independent with assistive device    Problem: RH Bed Mobility Goal: LTG Patient will perform bed mobility with assist (PT) Description: LTG: Patient will perform bed mobility with assistance, with/without cues (PT). Outcome: Completed/Met Flowsheets (Taken 01/26/2022 1118 by Lorie Phenix, PT) LTG: Pt will perform bed mobility with assistance level of: Independent   Problem: RH Bed to Chair Transfers Goal: LTG Patient will perform bed/chair transfers w/assist (PT) Description: LTG: Patient will perform bed to chair transfers with assistance (PT). Outcome: Completed/Met Flowsheets (Taken 01/26/2022 1118 by Lorie Phenix, PT) LTG: Pt will perform Bed to Chair Transfers with assistance level: Independent with assistive device    Problem: RH Car Transfers Goal: LTG Patient will perform car transfers with assist (PT) Description: LTG: Patient will perform car transfers with assistance (PT). Outcome: Completed/Met Flowsheets (Taken 01/26/2022 1118 by Lorie Phenix, PT) LTG: Pt will perform car transfers with assist:: Supervision/Verbal cueing   Problem: RH Furniture Transfers Goal: LTG Patient will perform furniture transfers w/assist (OT/PT) Description: LTG: Patient will perform furniture transfers  with assistance (OT/PT). Outcome: Completed/Met Flowsheets (Taken 01/26/2022 1118 by Lorie Phenix, PT) LTG: Pt will perform furniture transfers with assist:: Independent with assistive device    Problem: RH Ambulation Goal: LTG Patient will ambulate in controlled environment (PT) Description: LTG: Patient will ambulate in  a controlled environment, # of feet with assistance (PT). Outcome: Completed/Met Flowsheets Taken 02/05/2022 0532 by Alger Simons, PT LTG: Pt will ambulate in controlled environ  assist needed:: Independent with assistive device Taken 01/26/2022 1118 by Lorie Phenix, PT LTG: Ambulation distance in controlled environment: 162f with LRAD Goal: LTG Patient will ambulate in home environment (PT) Description: LTG: Patient will ambulate in home environment, # of feet with assistance (PT). Outcome: Completed/Met Flowsheets (Taken 01/26/2022 1118 by TLorie Phenix PT) LTG: Pt will ambulate in home environ  assist needed:: Independent with assistive device LTG: Ambulation distance in home environment: 526fwith LRAD Goal: LTG Patient will ambulate in community environment (PT) Description: LTG: Patient will ambulate in community environment, # of feet with assistance (PT). Outcome: Completed/Met Flowsheets (Taken 01/26/2022 1118 by TuLorie PhenixPT) LTG: Pt will ambulate in community environ  assist needed:: Supervision/Verbal cueing LTG: Ambulation distance in community environment: 15043f Problem: RH Stairs Goal: LTG Patient will ambulate up and down stairs w/assist (PT) Description: LTG: Patient will ambulate up and down # of stairs with assistance (PT) Outcome: Completed/Met Flowsheets (Taken 01/26/2022 1118 by TucLorie PhenixT) LTG: Pt will ambulate up/down stairs assist needed:: Supervision/Verbal cueing LTG: Pt will  ambulate up and down number of stairs: 12 steps to acces home with at least 1 rail

## 2022-02-05 NOTE — Progress Notes (Signed)
Patient is restful throughout shift in no acute distress or discomfort,Tylenol 650 mg po administered for left thigh aching discomfort. Patient anticipates discharge home today,Monitor, call bell within reach, bed alarm on.Continue regime

## 2022-02-05 NOTE — Progress Notes (Signed)
PROGRESS NOTE   Subjective/Complaints:  No issues overnite, anxious for d/c today , wife at bedside   ROS: Unable to obtain d/t patient not in room.    Objective:   No results found. Recent Labs    02/05/22 0509  WBC 4.8  HGB 17.3*  HCT 49.0  PLT 233    Recent Labs    02/05/22 0509  NA 138  K 4.4  CL 102  CO2 27  GLUCOSE 127*  BUN 23  CREATININE 1.12  CALCIUM 10.0     Intake/Output Summary (Last 24 hours) at 02/05/2022 0820 Last data filed at 02/05/2022 0732 Gross per 24 hour  Intake 456 ml  Output 700 ml  Net -244 ml         Physical Exam: Vital Signs Blood pressure 127/72, pulse (!) 57, temperature 98.3 F (36.8 C), temperature source Oral, resp. rate 18, height 5\' 10"  (1.778 m), weight 91.8 kg, SpO2 94 %.  From prior exams:  General: No acute distress Mood and affect are appropriate Heart: Regular rate and rhythm no rubs murmurs or extra sounds Lungs: Clear to auscultation, breathing unlabored, no rales or wheezes Abdomen: Positive bowel sounds, soft nontender to palpation, nondistended Extremities: No clubbing, cyanosis, or edema   MSK:     No pain with Left knee ROM, no effusion or jt deformity no pain to palp in  popliteal area, over quad and patellar tendons or along joint lines       Strength:           RUE and RLE 5/5 Left UE 4+, LLE 4+    Neurologic exam:  Cognition: AAO to person, place, time and event.  Language: Fluent, No substitutions or neoglisms. Mild dysarthria.   Mood: Pleasant affect, appropriate mood.  Sensation: To light touch intact in BL UEs and LEs   Spasticity: MAS 0 in all extremities.   Assessment/Plan: 1. Functional deficits right pontine infarct Stable for D/C today F/u PCP in 3-4 weeks F/u PM&R 2 weeks See D/C summary See D/C instructions   Care Tool:  Bathing    Body parts bathed by patient: Right arm, Left arm, Chest, Abdomen, Front perineal  area, Right upper leg, Left upper leg, Face, Buttocks, Left lower leg, Right lower leg   Body parts bathed by helper: Buttocks, Right lower leg, Left lower leg     Bathing assist Assist Level: Supervision/Verbal cueing     Upper Body Dressing/Undressing Upper body dressing   What is the patient wearing?: Pull over shirt    Upper body assist Assist Level: Supervision/Verbal cueing    Lower Body Dressing/Undressing Lower body dressing      What is the patient wearing?: Underwear/pull up, Pants     Lower body assist Assist for lower body dressing: Supervision/Verbal cueing     Toileting Toileting    Toileting assist Assist for toileting: Supervision/Verbal cueing     Transfers Chair/bed transfer  Transfers assist     Chair/bed transfer assist level: Independent with assistive device Chair/bed transfer assistive device:   Ambulation assist      Assist level: Independent with assistive device Assistive device: Cane-straight  Max distance: 347ft   Walk 10 feet activity   Assist     Assist level: Independent with assistive device Assistive device: Cane-straight   Walk 50 feet activity   Assist    Assist level: Independent with assistive device Assistive device: Cane-straight    Walk 150 feet activity   Assist    Assist level: Independent with assistive device Assistive device: Cane-straight    Walk 10 feet on uneven surface  activity   Assist     Assist level: Supervision/Verbal cueing Assistive device: Cane-straight   Wheelchair     Assist Is the patient using a wheelchair?: No Type of Wheelchair: Manual    Wheelchair assist level: Supervision/Verbal cueing Max wheelchair distance: 152ft    Wheelchair 50 feet with 2 turns activity    Assist        Assist Level: Supervision/Verbal cueing   Wheelchair 150 feet activity     Assist      Assist Level: Minimal Assistance - Patient  > 75%   Blood pressure 127/72, pulse (!) 57, temperature 98.3 F (36.8 C), temperature source Oral, resp. rate 18, height 5\' 10"  (1.778 m), weight 91.8 kg, SpO2 94 %.  Medical Problem List and Plan: 1. 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  Plan 30-day cardiac event monitor on discharge             -patient may shower             -ELOS/Goals: 10/31               Con't CIR- - PT/OT and SLP, 2.  Antithrombotics: -DVT/anticoagulation:  Pharmaceutical: Heparin             -antiplatelet therapy: Aspirin 81 mg daily and Plavix 75 mg day x3 weeks then Plavix alone (per neurology consult note; DC summary lists as continue ASA alone).    3. Pain Management: Tylenol PRN, Oxycodone 5 mg every 6 hours as needed     10/21- denies pain- con't regimen prn 4. Mood/Behavior/Sleep: Cymbalta 30 mg daily, Paxil 5 mg nightly.  Provide emotional support             -antipsychotic agents: N/A   5. Neuropsych/cognition: This patient is capable of making decisions on his own behalf.   6. Skin/Wound Care: Routine skin checks   7. Fluids/Electrolytes/Nutrition: Routine in and outs with follow-up chemistries.                  - 10/30: No labs this Am; will order for tomorrow    8.  Permissive hypertension (up to 220/110, normotension goal at 5-7 days).  Coreg 12.5 mg twice daily, lisinopril 20 mg daily.  Monitor with increased mobility    Vitals:   02/04/22 1925 02/05/22 0448  BP: 122/78 127/72  Pulse: 62 (!) 57  Resp: 16 18  Temp: 98.7 F (37.1 C) 98.3 F (36.8 C)  SpO2: 98% 94%  Controlled 10/31  9.  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  Monitor for any signs of fluid overload.  Follow-up outpatient cardiology services Filed Weights   02/03/22 0514 02/04/22 0511 02/05/22 0448  Weight: 95 kg 91.9 kg 91.8 kg                      - Daily weights stable/downtrending    10.  History of alcohol and tobacco use (half ppd).  NicoDerm  patch.  Provide counseling.  Monitor for any signs  of withdrawal.               - + chronic cough, likely underlying chronic lung dz; add PRN duonebs   10/21- no cough heard this AM-con't regimen 11.  Hyperlipidemia.  Lipitor   12.  BPH.  Check PVR.  Patient on Hytrin 4 mg nightly prior to admission held for permissive hypertension.  Resume as needed   13. Rosacea. Takes OTC cream at home; advised wife bring in and can add do med list.    14. Constipation. Having 2x BM yesterday but endorsing hard/difficult to pass stools.         - Start Miralax daily + Sennakot-S PRN   Last BM 10/28  15. OSA: noncompliant with home CPAP. Monitor vitals.  16. Hx of blepharospasm , receives botox injections   LOS: 11 days A FACE TO FACE EVALUATION WAS PERFORMED  Erick Colace 02/05/2022, 8:20 AM

## 2022-02-05 NOTE — Progress Notes (Signed)
Inpatient Rehabilitation Discharge Medication Review by a Pharmacist  A complete drug regimen review was completed for this patient to identify any potential clinically significant medication issues.  High Risk Drug Classes Is patient taking? Indication by Medication  Antipsychotic No   Anticoagulant No   Antibiotic No   Opioid Yes Oxycodone - PRN pain  Antiplatelet Yes Aspirin, Plavix x 3 wks, then Plavix alone- CVA prophylaxis  Hypoglycemics/insulin No   Vasoactive Medication Yes Coreg, Lisinopril- hypertension, heart failure  Chemotherapy No   Other Yes Atorvastatin - hyperlipidemia Cetirizine - allergies Duloxetine, paroxetine - mood Omeprazole - GERD ppx Latanoprost - dry eyes Miralax - constipation Nicotine patch - smoking cessation Vitamin D, MVI - supplementation      Type of Medication Issue Identified Description of Issue Recommendation(s)  Drug Interaction(s) (clinically significant)     Duplicate Therapy     Allergy     No Medication Administration End Date     Incorrect Dose     Additional Drug Therapy Needed     Significant med changes from prior encounter (inform family/care partners about these prior to discharge). Metoprolol changed to Coreg Communicate medication changes with patient/family at discharge  Other       Clinically significant medication issues were identified that warrant physician communication and completion of prescribed/recommended actions by midnight of the next day:  No   Pharmacist comments: DAPT for total of 21 days then clopidogrel monotherapy per neurology recommendations EOT date added to aspirin    Time spent performing this drug regimen review (minutes): 20   Thank you for allowing pharmacy to be a part of this patient's care.  Ardyth Harps, PharmD Clinical Pharmacist

## 2022-02-05 NOTE — Progress Notes (Signed)
Physical Therapy Discharge Summary  Patient Details  Name: Randy [name] MRN: [id number] Date of Birth: Dec 21, 1950  Date of Discharge from [place] 30, 2023  Today's Date: 02/05/2022   Patient has met 9 of 9 long term goals due to improved activity tolerance, improved balance, improved postural control, increased strength, functional use of  left upper extremity and left lower extremity, improved attention, improved awareness, and improved coordination.  Patient to discharge at an ambulatory level Modified Independent.   Patient's care partner is independent to provide the necessary physical assistance at discharge.  Reasons goals not met: n/a  Recommendation:  Patient will benefit from ongoing skilled PT services in outpatient setting to continue to advance safe functional mobility, address ongoing impairments in strength, coordination, balance, activity tolerance, cognition, safety awareness, and to continue to minimize fall risk.  Equipment: No equipment provided  Reasons for discharge: treatment goals met and discharge from hospital  Patient/family agrees with progress made and goals achieved: Yes  PT Discharge Precautions/Restrictions Precautions Precautions: Fall Precaution Comments: mild L hemi, old L rotator cuff tear Restrictions Weight Bearing Restrictions: No Vital Signs Therapy Vitals Temp: 98.3 F (36.8 C) Temp Source: Oral Pulse Rate: (!) 57 Resp: 18 BP: 127/72 Patient Position (if appropriate): Lying Oxygen Therapy SpO2: 94 % O2 Device: Room Air Pain Pain Assessment Pain Scale: 0-10 Faces Pain Scale: No hurt Pain Interference Pain Interference Pain Effect on Sleep: 1. Rarely or not at all Pain Interference with Therapy Activities: 1. Rarely or not at all Pain Interference with Day-to-Day Activities: 1. Rarely or not at all Vision/Perception  Vision - History Ability to See in Adequate Light: 1 Impaired Vision - Assessment Eye  Alignment: Within Functional Limits Ocular Range of Motion: Within Functional Limits Tracking/Visual Pursuits: Able to track stimulus in all quads without difficulty Saccades: Within functional limits Perception Perception: Within Functional Limits Praxis Praxis: Intact  Cognition Overall Cognitive Status: Within Functional Limits for tasks assessed Arousal/Alertness: Awake/alert Orientation Level: Oriented X4 Focused Attention: Appears intact Sustained Attention: Appears intact Selective Attention: Appears intact Memory: Appears intact Awareness: Appears intact Problem Solving: Appears intact Self Monitoring: Appears intact Safety/Judgment: Appears intact Sensation Sensation Light Touch: Appears Intact Coordination Gross Motor Movements are Fluid and Coordinated: No Fine Motor Movements are Fluid and Coordinated: Yes Heel Shin Test: mild L sided ataxia Motor  Motor Motor: Other (comment);Ataxia (hemipareisis) Motor - Discharge Observations: mild ataxia in LLE  Mobility Bed Mobility Bed Mobility: Supine to Sit;Sit to Supine Supine to Sit: Independent with assistive device Sit to Supine: Independent with assistive device Transfers Transfers: Sit to Stand;Stand to Sit;Stand Pivot Transfers Sit to Stand: Independent with assistive device Stand to Sit: Independent with assistive device Stand Pivot Transfers: Independent with assistive device Transfer (Assistive device): Straight cane Locomotion  Gait Ambulation: Yes Gait Assistance: Independent with assistive device Gait Distance (Feet): 300 Feet Assistive device: Straight cane Gait Gait: Yes Gait Pattern: Impaired Gait Pattern:  (decreaed but adequate L foot clearance) Gait velocity: decreased Stairs / Additional Locomotion Stairs: Yes Stairs Assistance: Independent with assistive device;Supervision/Verbal cueing Stair Management Technique: One rail Right Number of Stairs: 12 Height of Stairs: 6 Ramp:  Independent with assistive device Curb: Independent with assistive Production manager Mobility: Yes Wheelchair Assistance: Chartered loss adjuster: Both lower extermities Wheelchair Parts Management: Needs assistance Distance: 75 ft  Trunk/Postural Assessment  Cervical Assessment Cervical Assessment: Exceptions to [facility] (forward head with decreased cervical curvature) Thoracic Assessment Thoracic Assessment: Exceptions to [facility] (rounded shoulders) Lumbar Assessment  Lumbar Assessment: Exceptions to [facility] (decreased lordosis) Postural Control Postural Control: Within Functional Limits  Balance Balance Balance Assessed: Yes Standardized Balance Assessment Standardized Balance Assessment: Berg Balance Test Berg Balance Test Sit to Stand: Able to stand without using hands and stabilize independently Standing Unsupported: Able to stand safely 2 minutes Sitting with Back Unsupported but Feet Supported on Floor or Stool: Able to sit safely and securely 2 minutes Stand to Sit: Sits safely with minimal use of hands Transfers: Able to transfer safely, minor use of hands Standing Unsupported with Eyes Closed: Able to stand 10 seconds with supervision Standing Ubsupported with Feet Together: Able to place feet together independently and stand 1 minute safely From Standing, Reach Forward with Outstretched Arm: Can reach forward >12 cm safely (5") From Standing Position, Pick up Object from Floor: Able to pick up shoe safely and easily From Standing Position, Turn to Look Behind Over each Shoulder: Looks behind from both sides and weight shifts well Turn 360 Degrees: Able to turn 360 degrees safely in 4 seconds or less Standing Unsupported, Alternately Place Feet on Step/Stool: Able to stand independently and safely and complete 8 steps in 20 seconds Standing Unsupported, One Foot in Front: Able to plae foot ahead of the other independently and hold 30  seconds Standing on One Leg: Able to lift leg independently and hold 5-10 seconds Total Score: 52 Static Sitting Balance Static Sitting - Balance Support: Feet unsupported;No upper extremity supported Static Sitting - Level of Assistance: 7: Independent Dynamic Sitting Balance Dynamic Sitting - Balance Support: Feet unsupported;No upper extremity supported Dynamic Sitting - Level of Assistance: 7: Independent Dynamic Sitting - Balance Activities: Lateral lean/weight shifting;Forward lean/weight shifting;Reaching for objects;Reaching across midline;Reaching for weighted objects Static Standing Balance Static Standing - Balance Support: During functional activity;Bilateral upper extremity supported Static Standing - Level of Assistance: 7: Independent Dynamic Standing Balance Dynamic Standing - Balance Support: During functional activity Dynamic Standing - Level of Assistance: 6: Modified independent (Device/Increase time) Extremity Assessment      RLE Assessment RLE Assessment: Within Functional Limits General Strength Comments: 4+/5 to 5/5 LLE Assessment LLE Assessment: Exceptions to [facility] General Strength Comments: 4+/5 to 5/5 with delayed initiation   Alger Simons PT, DPT, CSRS 02/05/2022, 5:30 AM

## 2022-02-08 ENCOUNTER — Ambulatory Visit: Payer: No Typology Code available for payment source

## 2022-02-11 DIAGNOSIS — I480 Paroxysmal atrial fibrillation: Secondary | ICD-10-CM | POA: Diagnosis not present

## 2022-02-11 DIAGNOSIS — I6350 Cerebral infarction due to unspecified occlusion or stenosis of unspecified cerebral artery: Secondary | ICD-10-CM | POA: Diagnosis not present

## 2022-02-18 ENCOUNTER — Encounter: Payer: Self-pay | Admitting: Registered Nurse

## 2022-02-18 ENCOUNTER — Encounter: Payer: Medicare Other | Attending: Registered Nurse | Admitting: Registered Nurse

## 2022-02-18 VITALS — BP 155/89 | HR 70 | Ht 70.0 in | Wt 224.0 lb

## 2022-02-18 DIAGNOSIS — I10 Essential (primary) hypertension: Secondary | ICD-10-CM | POA: Diagnosis not present

## 2022-02-18 DIAGNOSIS — I6350 Cerebral infarction due to unspecified occlusion or stenosis of unspecified cerebral artery: Secondary | ICD-10-CM | POA: Insufficient documentation

## 2022-02-18 NOTE — Progress Notes (Unsigned)
Subjective:    Patient ID: Randy [name], male    DOB: 12/21/1950, 71 y.o.   MRN: [id number]  HPI: Randy [name] is a 71 y.o. male who is here for HFU of his  Right Pontine Cerebrovascular Accident and Essential Hypertension. He presented to [facility] on 01/20/2022 with acute onset of left sided weakness, dizziness and slurred speech.  Dr. Carren Rang H&P note:  HPI: Randy [name] is a 71 y.o. male with medical history significant of BPH, hyperlipidemia, hypertension, alcohol abuse, smoking use disorder, and more presents the ED with a chief complaint of weakness.  Patient reports that between 6 PM and 8 PM last night he started having left-sided weakness and slurred speech.  Patient reports he was drinking at the time.  He drank about a liter of wine.  He reports he had no headache.  He felt off balance, and ultimately at 3 AM he fell backwards striking the back of his head.  He was diaphoretic at that time.  He reports he was so weak he could not get up and his wife had to help him.  He did not lose consciousness.  Patient reports that he fell because he felt off balance.  He just put mouthwash in his mouth to gargle, and when throwing his head back his whole body went back.  Patient reports he has had no change in vision.  No change in hearing.  At first he says no numbness or paresthesias, but then he says his left shoulder has some paresthesias.  He reports his speech is slurred and slow.  His speech is still not returned to baseline per his report.  He denies any chest pain or palpitations.  Patient reports that he knew his gait was off, but did not feel like it was an asymmetric weakness issue.  He just felt weak everywhere per his report.  Patient reports he is right-hand dominant.  He had no difficulty swallowing.  He has no other complaints at this time.  Patient reports that up until this point he was in his normal state of health.   Patient does smoke 1 pack of cigarettes per  day.  He is thought about quitting and has nicotine patches at home but has not started the quitting process yet.  Advised on the importance of cessation.  Patient drinks alcohol.  He is not able to quantify how much he drinks, but that he does drink most nights of the week.  Last night he drank a liter of wine, sometimes he drinks 1/5 of liquor.  Patient reports he uses no illicit drugs.  He is vaccinated for COVID.  Patient is full code.  CT Head: WO Contrast:  IMPRESSION: 1. No acute intracranial abnormality.  MRI: WO Contrast IMPRESSION: 1. Acute nonhemorrhagic right paramedian pontine infarct measures up to 9 mm. 2. Moderate generalized atrophy. 3. Degenerative changes in the upper cervical spine with at least mild central canal narrowing at C3-4.   Neurology was consulted and he was maintained on Aspirin and Plavix x 3 weeks then Plavix alone.  Randy [name] was admitted to inpatient rehabilitation on 01/25/2022 and discharged home on 02/05/2022. He is receiving Home health Therapy from [facility]. He states he has pain in his right hip. He rates his pain 3.   [facility] was called today, he is scheduled for HFU appointment. Randy [name] understanding.     Pain Inventory Average Pain 3 Pain Right Now 3 My pain is dull  LOCATION OF PAIN  buttocks, hip, thigh, leg  BOWEL Number of stools per week: 5  BLADDER Normal    Mobility walk without assistance how many minutes can you walk? 30 ability to climb steps?  yes do you drive?  yes  Function retired  Neuro/Psych numbness anxiety suicidal thoughts  Prior Studies Any changes since last visit?  no  Physicians involved in your care Any changes since last visit?  no   Family History  Problem Relation Age of Onset   Heart attack Father    Dementia Sister    Arthritis Sister    Breast cancer Sister    Healthy Son    Healthy Daughter    Allergic rhinitis Neg Hx    Angioedema  Neg Hx    Asthma Neg Hx    Atopy Neg Hx    Eczema Neg Hx    Immunodeficiency Neg Hx    Urticaria Neg Hx    Social History   Socioeconomic History   Marital status: Divorced    Spouse name: Not on file   Number of children: Not on file   Years of education: Not on file   Highest education level: Not on file  Occupational History   Not on file  Tobacco Use   Smoking status: Every Day    Packs/day: 1.00    Types: Cigarettes   Smokeless tobacco: Never  Vaping Use   Vaping Use: Never used  Substance and Sexual Activity   Alcohol use: Yes    Comment: 2/5 th per week;   Drug use: No   Sexual activity: Not on file  Other Topics Concern   Not on file  Social History Narrative   Not on file   Social Determinants of Health   Financial Resource Strain: Not on file  Food Insecurity: No Food Insecurity (01/21/2022)   Hunger Vital Sign    Worried About Running Out of Food in the Last Year: Never true    Ran Out of Food in the Last Year: Never true  Transportation Needs: No Transportation Needs (01/21/2022)   PRAPARE - Administrator, Civil Service (Medical): No    Lack of Transportation (Non-Medical): No  Physical Activity: Not on file  Stress: Not on file  Social Connections: Not on file   Past Surgical History:  Procedure Laterality Date   NECK SURGERY  2015   TONSILLECTOMY     Past Medical History:  Diagnosis Date   Angio-edema    Blepharospasm    BPH (benign prostatic hyperplasia)    Cervical myelopathy (HCC)    Glaucoma 2021   Hyperlipidemia    Nephrolithiasis    Optic neuropathy    Right   Urticaria    There were no vitals taken for this visit.  Opioid Risk Score:   Fall Risk Score:  `1  Depression screen PHQ 2/9      No data to display            Review of Systems  Musculoskeletal:        Buttocks,hip,thigh, leg pain      Objective:   Physical Exam Vitals and nursing note reviewed.  Constitutional:      Appearance: Normal  appearance.  Cardiovascular:     Rate and Rhythm: Normal rate and regular rhythm.     Pulses: Normal pulses.     Heart sounds: Normal heart sounds.  Pulmonary:     Effort: Pulmonary effort is normal.  Breath sounds: Normal breath sounds.  Musculoskeletal:     Cervical back: Normal range of motion and neck supple.     Comments: Normal Muscle Bulk and Muscle Testing Reveals:  Upper Extremities: Right: Full ROM and Muscle Strength 5/5 Left Upper Extremity: Decreased ROM 90 Degrees and Muscle Strength 5/5 Lower Extremities: Full ROM and Muscle Strength 5/5 Arises from Table slowly using cane for support Narrow Based  Gait     Skin:    General: Skin is warm and dry.  Neurological:     Mental Status: He is alert and oriented to person, place, and time.  Psychiatric:        Mood and Affect: Mood normal.        Behavior: Behavior normal.         Assessment & Plan:  Right Pontine Cerebrovascular Accident: Continue outpatient therapy with [facility]. [facility] was called today, he has a scheduled HFU appointment. He verbalizes understanding. Continue current medication regimen. Continue to monitor.  Essential Hypertension: Continue current medication regimen. PCP following. Continue to Monitor.   F/U with Dr Wynn Banker in 4- 6 weeks

## 2022-02-21 ENCOUNTER — Encounter: Payer: Self-pay | Admitting: Registered Nurse

## 2022-03-11 ENCOUNTER — Ambulatory Visit: Payer: No Typology Code available for payment source | Admitting: Internal Medicine

## 2022-03-14 ENCOUNTER — Encounter: Payer: Self-pay | Admitting: Neurology

## 2022-03-14 ENCOUNTER — Ambulatory Visit (INDEPENDENT_AMBULATORY_CARE_PROVIDER_SITE_OTHER): Payer: Medicare Other | Admitting: Neurology

## 2022-03-14 VITALS — BP 149/86 | HR 70 | Ht 70.0 in | Wt 225.0 lb

## 2022-03-14 DIAGNOSIS — R3915 Urgency of urination: Secondary | ICD-10-CM | POA: Diagnosis not present

## 2022-03-14 DIAGNOSIS — I639 Cerebral infarction, unspecified: Secondary | ICD-10-CM

## 2022-03-14 DIAGNOSIS — R269 Unspecified abnormalities of gait and mobility: Secondary | ICD-10-CM | POA: Diagnosis not present

## 2022-03-14 NOTE — Patient Instructions (Signed)
Orders Placed This Encounter  Procedures   MR CERVICAL SPINE WO CONTRAST   MR LUMBAR SPINE WO CONTRAST      Take Plavix alone, no need for Asprin 81mg  daily

## 2022-03-14 NOTE — Progress Notes (Signed)
Chief Complaint  Patient presents with   Hospitalization Follow-up    Rm 15. Alone. NP internal referral for Right pontine cerebrovascular accident.      ASSESSMENT AND PLAN  Randy [name] is a 71 y.o. male   Right pontine stroke in October 2023,  With mild residual spastic left hemiparesis,  Vascular risk factor of aging, hypertension, hyperlipidemia  Plavix 75 mg alone, getting supplies from [place], stop aspirin 81 mg daily  Significant gait abnormality,  Brisk reflexes bilateral lower extremity, worsening low back pain,  MRI of cervical and lumbar spine to rule out cervical spondylitic myelopathy with superimposed lumbar radiculopathy  Return To Clinic With NP In 6 Months   DIAGNOSTIC DATA (LABS, IMAGING, TESTING) - I reviewed patient records, labs, notes, testing and imaging myself where available.  MPRESSIONS Left ventricular ejection fraction, by estimation, is 40 to 45%. The left ventricle has mildly decreased function. The left ventricle demonstrates global hypokinesis. Left ventricular diastolic parameters are consistent with Grade I diastolic dysfunction (impaired relaxation). 1. Right ventricular systolic function is mildly reduced. The right ventricular size is normal. Tricuspid regurgitation signal is inadequate for assessing PA pressure. 2. The mitral valve is grossly normal. Trivial mitral valve regurgitation. No evidence of mitral stenosis. 3. The aortic valve is tricuspid. There is moderate calcification of the aortic valve. There is mild thickening of the aortic valve. Aortic valve regurgitation is not visualized. Aortic valve sclerosis/calcification is present, without any evidence of aortic stenosis. 4. The inferior vena cava is normal in size with greater than 50% respiratory variability, suggesting right atrial pressure of 3 mmHg.   US Carotid artery No significant stenosis of internal carotid arteries.  MEDICAL HISTORY:  Randy [name] is a  71 year old male, seen in request by his primary care PA   Angiulli, Reuel Boom to follow-up for stroke, initial evaluation December 13, 2021  I reviewed and summarized the referring note. PMHX. HLD HTN Cervical decompression, Jan 2016, Glaucoma Right optic neuropathy, different shades of color,  Used to smoke 1/2 ppd, quit since stroke in Oct 2023. Used to drink alcohol moderately   He was admitted to the hospital January 20, 2022 for stroke after transient loss of consciousness, developed left-sided weakness,  Personally reviewed MRI of the brain, acute right pontine paramedian stroke, measuring up to 9 mm, moderate generalized atrophy, MRA of the brain, evidence of intracranial atherosclerotic disease,  Echocardiogram 45 to 50%, no evidence of intra ventricular thrombus  He was on aspirin 81 mg daily was discharged home with 75 mg Plavix, supposed to on dual antiplatelet agent for 3 weeks then single agent, he is getting Plavix from [place], still taking both  He recovered well, able to ambulate without assistant, still has mild residual left arm and leg weakness, unsteady gait,  He complains of longstanding history of low back pain, mild unsteady gait, now with increased difficulty also worsening urinary urgency,   PHYSICAL EXAM:   Vitals:   03/14/22 1254  BP: (!) 149/86  Pulse: 70  Weight: 225 lb (102.1 kg)  Height: 5\' 10"  (1.778 m)      Body mass index is 32.28 kg/m.  PHYSICAL EXAMNIATION:  Gen: NAD, conversant, well nourised, well groomed                     Cardiovascular: Regular rate rhythm, no peripheral edema, warm, nontender. Eyes: Conjunctivae clear without exudates or hemorrhage Neck: Supple, no carotid bruits. Pulmonary: Clear to auscultation bilaterally   NEUROLOGICAL EXAM:  MENTAL STATUS: Speech/cognition: Awake, alert, oriented to history taking and casual conversation CRANIAL NERVES: CN II: Visual fields are full to confrontation. Pupils are round equal  and briskly reactive to light. OD 20/30, OS 20/20-1. CN III, IV, VI: extraocular movement are normal. No ptosis. CN V: Facial sensation is intact to light touch CN VII: Face is symmetric with normal eye closure  CN VIII: Hearing is normal to causal conversation. CN IX, X: Phonation is normal. CN XI: Head turning and shoulder shrug are intact  MOTOR: Fixation of left upper extremity on rapid rotating movement, mild left arm pronation drift, mild left hip flexion weakness  REFLEXES: Reflexes are 2+ and symmetric at the biceps, triceps, 3/3 knees, and absent at ankles. Plantar responses are extensor bilaterally  SENSORY: Length dependent decreased light touch, pinprick to mid shin level  COORDINATION: There is no trunk or limb dysmetria noted.  GAIT/STANCE: Needed push-up to get up from sitting position, stooped forward, stiff, unsteady gait,  REVIEW OF SYSTEMS:  Full 14 system review of systems performed and notable only for as above All other review of systems were negative.   ALLERGIES: Allergies  Allergen Reactions   Hornet Venom Anaphylaxis   Wasp Venom Anaphylaxis   Yellow Jacket Venom [Bee Venom] Anaphylaxis   Other     Dogs and Cats per allergy clinic     HOME MEDICATIONS: Current Outpatient Medications  Medication Sig Dispense Refill   atorvastatin (LIPITOR) 80 MG tablet Take 0.5 tablets (40 mg total) by mouth daily. 30 tablet 0   carvedilol (COREG) 12.5 MG tablet Take 1 tablet (12.5 mg total) by mouth 2 (two) times daily with a meal. 60 tablet 0   cetirizine (ZYRTEC) 10 MG tablet Take 2 tablets (20 mg total) by mouth 2 (two) times daily. (Patient taking differently: Take 10 mg by mouth daily.) 120 tablet 5   Cholecalciferol (VITAMIN D) 50 MCG (2000 UT) tablet Take 1 tablet (2,000 Units total) by mouth daily. 30 tablet 0   clopidogrel (PLAVIX) 75 MG tablet Take 1 tablet (75 mg total) by mouth daily. 30 tablet 0   DULoxetine (CYMBALTA) 30 MG capsule Take 1 capsule (30  mg total) by mouth daily. 30 capsule 0   folic acid (FOLVITE) 1 MG tablet Take 1 tablet (1 mg total) by mouth daily. 30 tablet 0   lisinopril (ZESTRIL) 40 MG tablet Take 0.5 tablets (20 mg total) by mouth daily. 30 tablet 0   Multiple Vitamins-Minerals (CENTRUM) tablet Take 1 tablet by mouth daily.     naproxen (NAPROSYN) 250 MG tablet Take by mouth 2 (two) times daily as needed.     omeprazole (PRILOSEC) 20 MG capsule Take 1 capsule (20 mg total) by mouth daily. 30 capsule 0   PARoxetine (PAXIL) 10 MG tablet Take 0.5 tablets (5 mg total) by mouth at bedtime. 30 tablet 0   acetaminophen (TYLENOL) 325 MG tablet Take 2 tablets (650 mg total) by mouth every 4 (four) hours as needed for mild pain (or temp > 37.5 C (99.5 F)). (Patient not taking: Reported on 03/14/2022)     latanoprost (XALATAN) 0.005 % ophthalmic solution Place 1 drop into both eyes at bedtime. (Patient not taking: Reported on 03/14/2022)     nicotine (NICODERM CQ - DOSED IN MG/24 HOURS) 21 mg/24hr patch 21 mg patch daily x1 week then 14 mg patch daily x3 weeks then 7 mg patch daily x3 weeks and stop (Patient not taking: Reported on 03/14/2022) 28 patch 0  oxyCODONE (OXY IR/ROXICODONE) 5 MG immediate release tablet Take 1 tablet (5 mg total) by mouth every 6 (six) hours as needed for moderate pain. 15 tablet 0   polyethylene glycol (MIRALAX / GLYCOLAX) 17 g packet Take 17 g by mouth daily. (Patient not taking: Reported on 03/14/2022) 14 each 0   No current facility-administered medications for this visit.    PAST MEDICAL HISTORY: Past Medical History:  Diagnosis Date   Angio-edema    Blepharospasm    BPH (benign prostatic hyperplasia)    Cervical myelopathy (HCC)    Glaucoma 2021   Hyperlipidemia    Nephrolithiasis    Optic neuropathy    Right   Urticaria     PAST SURGICAL HISTORY: Past Surgical History:  Procedure Laterality Date   NECK SURGERY  2015   TONSILLECTOMY      FAMILY HISTORY: Family History  Problem  Relation Age of Onset   Heart attack Father    Dementia Sister    Arthritis Sister    Breast cancer Sister    Healthy Son    Healthy Daughter    Allergic rhinitis Neg Hx    Angioedema Neg Hx    Asthma Neg Hx    Atopy Neg Hx    Eczema Neg Hx    Immunodeficiency Neg Hx    Urticaria Neg Hx     SOCIAL HISTORY: Social History   Socioeconomic History   Marital status: Divorced    Spouse name: Not on file   Number of children: Not on file   Years of education: Not on file   Highest education level: Not on file  Occupational History   Not on file  Tobacco Use   Smoking status: Former    Packs/day: 1.00    Types: Cigarettes    Quit date: 01/25/2022    Years since quitting: 0.1   Smokeless tobacco: Never  Vaping Use   Vaping Use: Never used  Substance and Sexual Activity   Alcohol use: Yes    Comment: 2/5 th per week;   Drug use: No   Sexual activity: Not on file  Other Topics Concern   Not on file  Social History Narrative   Not on file   Social Determinants of Health   Financial Resource Strain: Not on file  Food Insecurity: No Food Insecurity (01/21/2022)   Hunger Vital Sign    Worried About Running Out of Food in the Last Year: Never true    Ran Out of Food in the Last Year: Never true  Transportation Needs: No Transportation Needs (01/21/2022)   PRAPARE - Administrator, Civil Service (Medical): No    Lack of Transportation (Non-Medical): No  Physical Activity: Not on file  Stress: Not on file  Social Connections: Not on file  Intimate Partner Violence: Not At Risk (01/21/2022)   Humiliation, Afraid, Rape, and Kick questionnaire    Fear of Current or Ex-Partner: No    Emotionally Abused: No    Physically Abused: No    Sexually Abused: No      Levert Feinstein, M.D. Ph.D.  [facility] [address] [place], [place] [address] Ph: [phone] Fax: [phone]  CC:  Charlton Amor, PA-C [address]  [place],  [place] [address]  Rema Jasmine, NP

## 2022-03-20 ENCOUNTER — Telehealth: Payer: Self-pay | Admitting: Neurology

## 2022-03-20 NOTE — Telephone Encounter (Signed)
[employer] medicare NPR sent to [facility] [phone] 

## 2022-03-27 ENCOUNTER — Telehealth (INDEPENDENT_AMBULATORY_CARE_PROVIDER_SITE_OTHER): Payer: Self-pay | Admitting: Gastroenterology

## 2022-03-27 NOTE — Telephone Encounter (Signed)
Randy [name] from the [facility] left voice mail message stating this patient was previously scheduled to have a colonoscopy - patient had a stroke and had to cancel - states his doctor has released him to go ahead with the colonoscopy - please advise - ph# [phone] ext [phone] - thanks

## 2022-03-27 NOTE — Telephone Encounter (Signed)
LMTRC

## 2022-03-28 ENCOUNTER — Encounter: Payer: Self-pay | Admitting: Gastroenterology

## 2022-03-28 NOTE — Telephone Encounter (Signed)
Informed Suzie that spoke with provider and pt will need an office visit before we can reschedule.

## 2022-03-28 NOTE — Telephone Encounter (Signed)
Appointment has been made for 05/16/22.

## 2022-04-04 ENCOUNTER — Encounter: Payer: Self-pay | Admitting: Physical Medicine & Rehabilitation

## 2022-04-04 ENCOUNTER — Encounter: Payer: Medicare Other | Attending: Registered Nurse | Admitting: Physical Medicine & Rehabilitation

## 2022-04-04 VITALS — BP 155/94 | HR 70 | Ht 70.0 in | Wt 225.0 lb

## 2022-04-04 DIAGNOSIS — I6350 Cerebral infarction due to unspecified occlusion or stenosis of unspecified cerebral artery: Secondary | ICD-10-CM | POA: Diagnosis not present

## 2022-04-04 NOTE — Patient Instructions (Signed)

## 2022-04-04 NOTE — Progress Notes (Signed)
Subjective:    Patient ID: Randy [name], male    DOB: 09/12/1950, 71 y.o.   MRN: [id number] 71 y.o. right-handed male with history of BPH glaucoma systolic and diastolic heart failure hyperlipidemia hypertension alcohol as well as tobacco use.  Per chart review lives with spouse independent prior to admission.  Presented to [facility] 01/20/2022 with acute onset of left-sided weakness dizziness and slurred speech.  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  Moderate generalized atrophy.  MRI of the head and neck moderate to high-grade stenosis proximal M3 segments of the right superior division.  Patient did not receive tPA.  Admission chemistries unremarkable except glucose 149 alcohol negative.  Carotid ultrasound no stenosis.  Echocardiogram with ejection fraction of 40 to 45%.  Left ventricle demonstrating global hypokinesis as well as grade 1 diastolic dysfunction.  Neurology follow-up placed on low-dose aspirin and Plavix x3 weeks then Plavix alone as documented by neurology services 01/21/2022.   Admit date: 01/25/2022 Discharge date: 02/05/2022 HPI  Patient living at home with his wife.  Wife works 12-hour shifts at night.  The patient is dressing and bathing himself independently.  He does not use an assistive device.  He has completed his post hospital rehabilitation. He has followed up with neurology.  Reviewed Dr. Terrace Arabia note The patient tried driving once although he did not have any type of medical clearance. He had no difficulties. His daughter was with him today.  She is visiting from [place]. Patient has not seen PCP thus far  Pain Inventory Average Pain 3 Pain Right Now 3 My pain is tingling and aching  In the last 24 hours, has pain interfered with the following? General activity 0 Relation with others 0 Enjoyment of life 0 What TIME of day is your pain at its worst? morning  Sleep (in general) Fair  Pain is worse  with: bending and sitting Pain improves with: medication Relief from Meds: 7  Family History  Problem Relation Age of Onset   Heart attack Father    Dementia Sister    Arthritis Sister    Breast cancer Sister    Healthy Son    Healthy Daughter    Allergic rhinitis Neg Hx    Angioedema Neg Hx    Asthma Neg Hx    Atopy Neg Hx    Eczema Neg Hx    Immunodeficiency Neg Hx    Urticaria Neg Hx    Social History   Socioeconomic History   Marital status: Divorced    Spouse name: Not on file   Number of children: Not on file   Years of education: Not on file   Highest education level: Not on file  Occupational History   Not on file  Tobacco Use   Smoking status: Former    Packs/day: 1.00    Types: Cigarettes    Quit date: 01/25/2022    Years since quitting: 0.1   Smokeless tobacco: Never  Vaping Use   Vaping Use: Never used  Substance and Sexual Activity   Alcohol use: Yes    Comment: 2/5 th per week;   Drug use: No   Sexual activity: Not on file  Other Topics Concern   Not on file  Social History Narrative   Not on file   Social Determinants of Health   Financial Resource Strain: Not on file  Food Insecurity: No Food Insecurity (01/21/2022)   Hunger Vital Sign    Worried About Running  Out of Food in the Last Year: Never true    Ran Out of Food in the Last Year: Never true  Transportation Needs: No Transportation Needs (01/21/2022)   PRAPARE - Hydrologist (Medical): No    Lack of Transportation (Non-Medical): No  Physical Activity: Not on file  Stress: Not on file  Social Connections: Not on file   Past Surgical History:  Procedure Laterality Date   NECK SURGERY  2015   TONSILLECTOMY     Past Surgical History:  Procedure Laterality Date   NECK SURGERY  2015   TONSILLECTOMY     Past Medical History:  Diagnosis Date   Angio-edema    Blepharospasm    BPH (benign prostatic hyperplasia)    Cervical myelopathy ([place])     Glaucoma 2021   Hyperlipidemia    Nephrolithiasis    Optic neuropathy    Right   Urticaria    BP (!) 155/94   Pulse 70   Ht 5\' 10"  (1.778 m)   Wt 225 lb (102.1 kg)   SpO2 96%   BMI 32.28 kg/m   Opioid Risk Score:   Fall Risk Score:  `1  Depression screen [facility] 2/9     04/04/2022   10:47 AM 02/18/2022    1:25 PM  Depression screen PHQ 2/9  Decreased Interest 0 1  Down, Depressed, Hopeless 0 1  PHQ - 2 Score 0 2  Altered sleeping  0  Tired, decreased energy  0  Change in appetite  1  Feeling bad or failure about yourself   0  Trouble concentrating  0  Moving slowly or fidgety/restless  0  Suicidal thoughts  1  PHQ-9 Score  4  Difficult doing work/chores  Somewhat difficult      Review of Systems  Musculoskeletal:  Positive for back pain.       Left Shoulder Pain Left Hip Pain   All other systems reviewed and are negative.     Objective:   Physical Exam Vitals and nursing note reviewed.  Constitutional:      Appearance: He is obese.  HENT:     Head: Normocephalic and atraumatic.  Eyes:     Extraocular Movements: Extraocular movements intact.     Conjunctiva/sclera: Conjunctivae normal.     Pupils: Pupils are equal, round, and reactive to light.  Musculoskeletal:     Right lower leg: No edema.     Left lower leg: No edema.     Comments: No pain with upper extremity or lower extremity range of motion no joint swelling noted. Posture mildly kyphotic  Skin:    General: Skin is warm and dry.  Neurological:     General: No focal deficit present.     Mental Status: He is alert and oriented to person, place, and time.     Comments: Motor strength is 5/5 bilateral deltoid bicep and tricep 4/5 left grip 5/5 right grip Lower extremity 5/5 bilateral hip flexor knee extensor ankle dorsiflexor Extraocular muscles intact No evidence of visual field cut Sensation is intact to light touch bilateral upper and lower limbs Ambulates without assistive device no evidence  of toe drag or knee instability.  Psychiatric:        Mood and Affect: Mood normal.        Behavior: Behavior normal.    Fine motor intact finger thumb opposition bilateral upper limbs       Assessment & Plan:   1.  History  of right pontine infarct excellent recovery.  Still has some left hand weakness as his main residual.  No formal rehabilitation needed at this time. Discussed return to driving with the patient and his daughter.  She can help supervise this. Graduated return to driving instructions were provided. It is recommended that the patient first drives with another licensed driver in an empty parking lot. If the patient does well with this, and they can drive on a quiet street with the licensed driver. If the patient does well with this they can drive on a busy street with a licensed driver. If the patient does well with this, the next time out they can go by himself. For the first month after resuming driving, I recommend no nighttime or Interstate driving.   Patient will follow-up medically with PCP Follow-up with neurology in approximately 6 months Physical medicine rehab follow-up on as needed basis

## 2022-04-05 ENCOUNTER — Encounter: Payer: Medicare Other | Admitting: Physical Medicine & Rehabilitation

## 2022-05-16 ENCOUNTER — Encounter: Payer: Self-pay | Admitting: Gastroenterology

## 2022-05-16 ENCOUNTER — Telehealth: Payer: Self-pay | Admitting: *Deleted

## 2022-05-16 ENCOUNTER — Ambulatory Visit (INDEPENDENT_AMBULATORY_CARE_PROVIDER_SITE_OTHER): Payer: No Typology Code available for payment source | Admitting: Gastroenterology

## 2022-05-16 VITALS — BP 129/82 | HR 69 | Temp 97.4°F | Ht 69.0 in | Wt 225.4 lb

## 2022-05-16 DIAGNOSIS — Z86010 Personal history of colonic polyps: Secondary | ICD-10-CM

## 2022-05-16 NOTE — Patient Instructions (Signed)
We are talking with the neurologist to make sure it's ok to stop the Plavix 5 days before the procedure.  We will plan for the procedure around March or April with Dr. Jenetta Downer!  It was good to meet you today!  It was a pleasure to see you today. I want to create trusting relationships with patients and provide genuine, compassionate, and quality care. I truly value your feedback, so please be on the lookout for a survey regarding your visit with me today. I appreciate your time in completing this!    Annitta Needs, PhD, ANP-BC [facility]

## 2022-05-16 NOTE — Addendum Note (Signed)
Addended by: Harvel Quale on: 05/16/2022 04:29 PM   Modules accepted: Level of Service

## 2022-05-16 NOTE — Progress Notes (Addendum)
Gastroenterology Office Note    Referring Provider: Allayne Butcher, NP Primary Care Physician:  Allayne Butcher, NP  Primary GI: Dr. Jenetta Downer   Chief Complaint   Chief Complaint  Patient presents with   New Patient (Initial Visit)    Referral for colonoscopy     History of Present Illness   Randy [name] is a 72 y.o. male presenting today at the request of Allayne Butcher, NP due to need for surveillance colonoscopy.   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 He is on Plavix 75 mg daily.   History of polyps in the past, with last about 5 years ago. In [place].   No abdominal pain, N/V, changes in bowel habits, constipation, diarrhea, overt GI bleeding, GERD, dysphagia, unexplained weight loss, lack of appetite, unexplained weight gain.   No FH colon cancer.        Past Medical History:  Diagnosis Date   Angio-edema    Blepharospasm    BPH (benign prostatic hyperplasia)    Cervical myelopathy ([facility])    CVA (cerebral vascular accident) ([place]) 01/2022   Glaucoma 2021   Hyperlipidemia    Nephrolithiasis    Optic neuropathy    Right   Urticaria     Past Surgical History:  Procedure Laterality Date   NECK SURGERY  2015   TONSILLECTOMY      Current Outpatient Medications  Medication Sig Dispense Refill   acetaminophen (TYLENOL) 325 MG tablet Take 2 tablets (650 mg total) by mouth every 4 (four) hours as needed for mild pain (or temp > 37.5 C (99.5 F)).     atorvastatin (LIPITOR) 80 MG tablet Take 0.5 tablets (40 mg total) by mouth daily. 30 tablet 0   carvedilol (COREG) 12.5 MG tablet Take 1 tablet (12.5 mg total) by mouth 2 (two) times daily with a meal. 60 tablet 0   cetirizine (ZYRTEC) 10 MG tablet Take 2 tablets (20 mg total) by mouth 2 (two) times daily. (Patient taking differently: Take 10 mg by mouth daily.) 120 tablet 5   Cholecalciferol (VITAMIN D) 50 MCG (2000 UT) tablet  Take 1 tablet (2,000 Units total) by mouth daily. 30 tablet 0   clopidogrel (PLAVIX) 75 MG tablet Take 1 tablet (75 mg total) by mouth daily. 30 tablet 0   DULoxetine (CYMBALTA) 30 MG capsule Take 1 capsule (30 mg total) by mouth daily. 30 capsule 0   folic acid (FOLVITE) 1 MG tablet Take 1 tablet (1 mg total) by mouth daily. 30 tablet 0   latanoprost (XALATAN) 0.005 % ophthalmic solution Place 1 drop into both eyes at bedtime.     lisinopril (ZESTRIL) 40 MG tablet Take 0.5 tablets (20 mg total) by mouth daily. 30 tablet 0   Multiple Vitamins-Minerals (CENTRUM) tablet Take 1 tablet by mouth daily.     naproxen (NAPROSYN) 250 MG tablet Take by mouth 2 (two) times daily as needed.     omeprazole (PRILOSEC) 20 MG capsule Take 1 capsule (20 mg total) by mouth daily. 30 capsule 0   PARoxetine (PAXIL) 10 MG tablet Take 0.5 tablets (5 mg total) by mouth at bedtime. 30 tablet 0   polyethylene glycol (MIRALAX / GLYCOLAX) 17 g packet Take 17 g by mouth daily. 14 each 0   No current facility-administered medications for this visit.    Allergies as of 05/16/2022 - Review Complete 05/16/2022  Allergen Reaction Noted  Hornet venom Anaphylaxis 01/01/2017   Wasp venom Anaphylaxis 01/01/2017   Yellow jacket venom [bee venom] Anaphylaxis 01/01/2017   Other  12/05/2021    Family History  Problem Relation Age of Onset   Heart attack Father    Dementia Sister    Arthritis Sister    Breast cancer Sister    Healthy Daughter    Healthy Son    Allergic rhinitis Neg Hx    Angioedema Neg Hx    Asthma Neg Hx    Atopy Neg Hx    Eczema Neg Hx    Immunodeficiency Neg Hx    Urticaria Neg Hx    Colon cancer Neg Hx     Social History   Socioeconomic History   Marital status: Married    Spouse name: Not on file   Number of children: Not on file   Years of education: Not on file   Highest education level: Not on file  Occupational History   Not on file  Tobacco Use   Smoking status: Former     Packs/day: 1.00    Types: Cigarettes    Quit date: 01/25/2022    Years since quitting: 0.3   Smokeless tobacco: Never  Vaping Use   Vaping Use: Never used  Substance and Sexual Activity   Alcohol use: Yes    Comment: 2 5ths per week   Drug use: No   Sexual activity: Not on file  Other Topics Concern   Not on file  Social History Narrative   Not on file   Social Determinants of Health   Financial Resource Strain: Not on file  Food Insecurity: No Food Insecurity (01/21/2022)   Hunger Vital Sign    Worried About Running Out of Food in the Last Year: Never true    Ran Out of Food in the Last Year: Never true  Transportation Needs: No Transportation Needs (01/21/2022)   PRAPARE - Hydrologist (Medical): No    Lack of Transportation (Non-Medical): No  Physical Activity: Not on file  Stress: Not on file  Social Connections: Not on file  Intimate Partner Violence: Not At Risk (01/21/2022)   Humiliation, Afraid, Rape, and Kick questionnaire    Fear of Current or Ex-Partner: No    Emotionally Abused: No    Physically Abused: No    Sexually Abused: No     Review of Systems   Gen: Denies any fever, chills, fatigue, weight loss, lack of appetite.  CV: Denies chest pain, heart palpitations, peripheral edema, syncope.  Resp: Denies shortness of breath at rest or with exertion. Denies wheezing or cough.  GI: Denies dysphagia or odynophagia. Denies jaundice, hematemesis, fecal incontinence. GU : Denies urinary burning, urinary frequency, urinary hesitancy MS: Denies joint pain, muscle weakness, cramps, or limitation of movement.  Derm: Denies rash, itching, dry skin Psych: Denies depression, anxiety, memory loss, and confusion Heme: Denies bruising, bleeding, and enlarged lymph nodes.   Physical Exam   BP 129/82   Pulse 69   Temp (!) 97.4 F (36.3 C)   Ht 5\' 9"  (1.753 m)   Wt 225 lb 6.4 oz (102.2 kg)   BMI 33.29 kg/m  General:   Alert and  oriented. Pleasant and cooperative. Well-nourished and well-developed.  Head:  Normocephalic and atraumatic. Eyes:  Without icterus Ears:  Normal auditory acuity. Lungs:  Clear to auscultation bilaterally.  Heart:  S1, S2 present without murmurs appreciated.  Abdomen:  +BS, soft, non-tender and non-distended. No  HSM noted. Possible ventral hernia.  Rectal:  Deferred  Msk:  Symmetrical without gross deformities.  Extremities:  Without edema. Neurologic:  Alert and  oriented x4 Skin:  Intact without significant lesions or rashes. Psych:  Alert and cooperative. Normal mood and affect.   Assessment   Randy [name] is a 72 y.o. male presenting today with 72 y.o. male presenting today at the request of Allayne Butcher, NP due to need for surveillance colonoscopy. History significant for right pontine stroke in  Oct 2023 with mild residual spastic left hemiparesis. He is on Plavix 75 mg daily.   Notes history of polyps with last colonoscopy about 5 years ago in [place]; I do not have these reports.   He has no concerning lower or upper GI signs/symptoms.  Will need to reach out to Neurology regarding holding Plavix. Anticipate at least waiting 6 months (around March/April 2024) prior to pursuing elective colonoscopy.       PLAN   Request clearance from Neurology  Will need to hold Plavix X 5 days  Will arrange colonoscopy with Dr. Jenetta Downer once clearance obtained.   Annitta Needs, PhD, ANP-BC [facility]   I have reviewed the note and agree with the APP's assessment as described in this progress note  Maylon Peppers, MD Gastroenterology and [facility]

## 2022-05-16 NOTE — Telephone Encounter (Addendum)
    05/16/22  [name] 10-23-1950  What type of surgery is being performed? Colonoscopy  When is surgery scheduled? TBD in about March/April 2024  What type of clearance is required (medical or pharmacy to hold medication or both? Medical/medication  Are there any medications that need to be held prior to surgery and how long? Plavix x 5 days prior  Name of physician performing surgery?  Dr. Maylon Peppers [facility] at [facility] Phone: [phone] Fax: [phone]  Anethesia type (none, local, MAC, general)? MAC  _______________________________________________  Patient suffered pontine stroke in October 2023, consistent with small vessel disease, vascular risk factor of aging, hypertension, hyperlipidemia, it is okay to stop Plavix 5 days prior to scheduled colonoscopy, please resume as soon as possible, please increase water intake perisurgical period of time to avoid hypovolemia  Marcial Pacas, M.D. Ph.D.  [facility] [place], [place] [address] Phone: [phone] Fax:      [phone]

## 2022-05-17 ENCOUNTER — Encounter: Payer: Self-pay | Admitting: Gastroenterology

## 2022-05-28 NOTE — Telephone Encounter (Signed)
May hold Plavix X 5 days. Ok to arrange colonoscopy with Dr. Jenetta Downer, ASA 3.

## 2022-05-28 NOTE — Telephone Encounter (Signed)
Noted will schedule in April per encounter form

## 2022-06-10 ENCOUNTER — Other Ambulatory Visit (HOSPITAL_COMMUNITY): Payer: Self-pay | Admitting: Internal Medicine

## 2022-06-10 DIAGNOSIS — K4090 Unilateral inguinal hernia, without obstruction or gangrene, not specified as recurrent: Secondary | ICD-10-CM

## 2022-06-12 ENCOUNTER — Encounter: Payer: Self-pay | Admitting: *Deleted

## 2022-06-12 NOTE — Progress Notes (Unsigned)
Patient ID: Randy [name], male   DOB: April 02, 1951, 72 y.o.   MRN: [id number] Patient only wore [employer] 30 day cardiac event monitor for 22 hrs. Charges/ Order cancelled.

## 2022-06-13 MED ORDER — PEG 3350-KCL-NA BICARB-NACL 420 G PO SOLR: 4000.0000 mL | Freq: Once | ORAL | 0 refills | Status: DC

## 2022-06-13 NOTE — Addendum Note (Signed)
Addended by: Vicente Males on: 06/13/2022 03:38 PM   Modules accepted: Orders

## 2022-06-13 NOTE — Telephone Encounter (Signed)
Contacted pt to schedule his TCS (ASA3). Pt states he will need to get with wife to see what days she will be available. Informed pt to give me a call back when he can schedule.

## 2022-06-13 NOTE — Telephone Encounter (Signed)
Pt returned call and scheduled for 07/10/22. Instructions mailed to patient. Prep sent to pharmacy.  PA approved via [employer]- Authorization [id number] Will call pt back with pre op appt

## 2022-06-17 NOTE — Telephone Encounter (Signed)
Pt contacted and made aware of pre op time-07/5822 in person at [facility] at 10:30 AM

## 2022-06-19 NOTE — Telephone Encounter (Signed)
Now he has Randy [name]: [id number] exp. 06/19/22-07/19/22

## 2022-06-19 NOTE — Telephone Encounter (Signed)
The patient never had a [facility] referral sent to our office when he was referred here from the hospital so he would have to get a referral from them if he wants the [place] to cover the MRI.

## 2022-06-21 ENCOUNTER — Ambulatory Visit (HOSPITAL_COMMUNITY): Payer: Medicare PPO

## 2022-07-05 ENCOUNTER — Ambulatory Visit (HOSPITAL_COMMUNITY)
Admission: RE | Admit: 2022-07-05 | Discharge: 2022-07-05 | Disposition: A | Discharge status: Home / Self Care | Payer: No Typology Code available for payment source | Source: Ambulatory Visit | Attending: Internal Medicine | Admitting: Internal Medicine

## 2022-07-05 DIAGNOSIS — K4090 Unilateral inguinal hernia, without obstruction or gangrene, not specified as recurrent: Secondary | ICD-10-CM | POA: Insufficient documentation

## 2022-07-09 ENCOUNTER — Other Ambulatory Visit (INDEPENDENT_AMBULATORY_CARE_PROVIDER_SITE_OTHER): Payer: Self-pay

## 2022-07-09 MED ORDER — PEG 3350-KCL-NA BICARB-NACL 420 G PO SOLR: 4000.0000 mL | Freq: Once | ORAL | 0 refills | Status: AC

## 2022-07-12 ENCOUNTER — Encounter (HOSPITAL_COMMUNITY): Payer: Self-pay

## 2022-07-12 ENCOUNTER — Encounter (HOSPITAL_COMMUNITY)
Admission: RE | Admit: 2022-07-12 | Discharge: 2022-07-12 | Disposition: A | Discharge status: Home / Self Care | Payer: No Typology Code available for payment source | Source: Ambulatory Visit | Attending: Gastroenterology

## 2022-07-15 ENCOUNTER — Telehealth: Payer: Self-pay | Admitting: Gastroenterology

## 2022-07-15 NOTE — Telephone Encounter (Signed)
Pt contacted. Pt states "they gave me call right after I left the message".

## 2022-07-15 NOTE — Telephone Encounter (Signed)
Patient left a message he was having a procedure 4/9 and asked if we would call him back.  That is all that was said on the message.

## 2022-07-16 ENCOUNTER — Ambulatory Visit (HOSPITAL_COMMUNITY)
Admission: RE | Admit: 2022-07-16 | Discharge: 2022-07-16 | Disposition: A | Discharge status: Home / Self Care | Payer: No Typology Code available for payment source | Attending: Gastroenterology | Admitting: Gastroenterology

## 2022-07-16 ENCOUNTER — Ambulatory Visit (HOSPITAL_COMMUNITY): Payer: No Typology Code available for payment source | Admitting: Anesthesiology

## 2022-07-16 ENCOUNTER — Encounter (HOSPITAL_COMMUNITY): Payer: Self-pay | Admitting: Gastroenterology

## 2022-07-16 ENCOUNTER — Encounter (HOSPITAL_COMMUNITY)
Admission: RE | Disposition: A | Discharge status: Home / Self Care | Payer: Self-pay | Source: Home / Self Care | Attending: Gastroenterology

## 2022-07-16 DIAGNOSIS — D124 Benign neoplasm of descending colon: Secondary | ICD-10-CM | POA: Diagnosis not present

## 2022-07-16 DIAGNOSIS — I10 Essential (primary) hypertension: Secondary | ICD-10-CM | POA: Diagnosis not present

## 2022-07-16 DIAGNOSIS — G709 Myoneural disorder, unspecified: Secondary | ICD-10-CM | POA: Insufficient documentation

## 2022-07-16 DIAGNOSIS — D123 Benign neoplasm of transverse colon: Secondary | ICD-10-CM | POA: Diagnosis not present

## 2022-07-16 DIAGNOSIS — D125 Benign neoplasm of sigmoid colon: Secondary | ICD-10-CM | POA: Diagnosis not present

## 2022-07-16 DIAGNOSIS — K635 Polyp of colon: Secondary | ICD-10-CM | POA: Insufficient documentation

## 2022-07-16 DIAGNOSIS — N400 Enlarged prostate without lower urinary tract symptoms: Secondary | ICD-10-CM | POA: Diagnosis not present

## 2022-07-16 DIAGNOSIS — Z86010 Personal history of colonic polyps: Secondary | ICD-10-CM

## 2022-07-16 DIAGNOSIS — D122 Benign neoplasm of ascending colon: Secondary | ICD-10-CM

## 2022-07-16 DIAGNOSIS — Z8673 Personal history of transient ischemic attack (TIA), and cerebral infarction without residual deficits: Secondary | ICD-10-CM

## 2022-07-16 DIAGNOSIS — Z87891 Personal history of nicotine dependence: Secondary | ICD-10-CM | POA: Diagnosis not present

## 2022-07-16 DIAGNOSIS — Z1211 Encounter for screening for malignant neoplasm of colon: Secondary | ICD-10-CM | POA: Diagnosis present

## 2022-07-16 DIAGNOSIS — E785 Hyperlipidemia, unspecified: Secondary | ICD-10-CM | POA: Diagnosis not present

## 2022-07-16 HISTORY — PX: POLYPECTOMY: SHX5525

## 2022-07-16 HISTORY — PX: COLONOSCOPY WITH PROPOFOL: SHX5780

## 2022-07-16 LAB — HM COLONOSCOPY

## 2022-07-16 SURGERY — COLONOSCOPY WITH PROPOFOL
Anesthesia: General

## 2022-07-16 MED ORDER — LACTATED RINGERS IV SOLN: INTRAVENOUS | Status: DC

## 2022-07-16 MED ORDER — LIDOCAINE HCL 1 % IJ SOLN
INTRAMUSCULAR | Status: DC | PRN
  Administered 2022-07-16: 50 mg via INTRADERMAL

## 2022-07-16 MED ORDER — PHENYLEPHRINE HCL (PRESSORS) 10 MG/ML IV SOLN
INTRAVENOUS | Status: DC | PRN
  Administered 2022-07-16: 80 ug via INTRAVENOUS
  Administered 2022-07-16: 120 ug via INTRAVENOUS
  Administered 2022-07-16: 8 ug via INTRAVENOUS

## 2022-07-16 MED ORDER — PROPOFOL 10 MG/ML IV BOLUS
INTRAVENOUS | Status: DC | PRN
  Administered 2022-07-16: 50 mg via INTRAVENOUS
  Administered 2022-07-16: 20 mg via INTRAVENOUS
  Administered 2022-07-16: 120 mg via INTRAVENOUS
  Administered 2022-07-16: 30 mg via INTRAVENOUS
  Administered 2022-07-16: 50 mg via INTRAVENOUS
  Administered 2022-07-16: 30 mg via INTRAVENOUS
  Administered 2022-07-16: 50 mg via INTRAVENOUS
  Administered 2022-07-16: 20 mg via INTRAVENOUS
  Administered 2022-07-16: 50 mg via INTRAVENOUS
  Administered 2022-07-16: 30 mg via INTRAVENOUS

## 2022-07-16 NOTE — Op Note (Signed)
[facility] Patient Name: Randy [name] Procedure Date: 07/16/2022 12:00 PM MRN: [id number] Date of Birth: 07-19-1950 Attending MD: Katrinka Blazinganiel Castaneda , , [phone] CSN: [id number] Age: 72 Admit Type: Outpatient Procedure:                Colonoscopy Indications:              Surveillance: Personal history of colonic polyps                            (unknown histology) on last colonoscopy 5 years ago Providers:                Katrinka Blazinganiel Castaneda, Sheran FavaJessica Boudreaux, Burke Keelsrisann                            Tilley, Technician Referring MD:              Medicines:                Monitored Anesthesia Care Complications:            No immediate complications. Estimated Blood Loss:     Estimated blood loss: none. Procedure:                Pre-Anesthesia Assessment:                           - Prior to the procedure, a History and Physical                            was performed, and patient medications, allergies                            and sensitivities were reviewed. The patient's                            tolerance of previous anesthesia was reviewed.                           - The risks and benefits of the procedure and the                            sedation options and risks were discussed with the                            patient. All questions were answered and informed                            consent was obtained.                           - ASA Grade Assessment: II - A patient with mild                            systemic disease.                           After obtaining informed consent, the colonoscope  was passed under direct vision. Throughout the                            procedure, the patient's blood pressure, pulse, and                            oxygen saturations were monitored continuously. The                            [id number]-[id number] ([id number]) scope was introduced through                            the anus and advanced to the the  cecum, identified                            by appendiceal orifice and ileocecal valve. The                            colonoscopy was performed without difficulty. The                            patient tolerated the procedure well. The quality                            of the bowel preparation was good. Scope In: 12:54:27 PM Scope Out: 1:25:54 PM Scope Withdrawal Time: 0 hours 20 minutes 48 seconds  Total Procedure Duration: 0 hours 31 minutes 27 seconds  Findings:      The perianal and digital rectal examinations were normal.      Two sessile polyps were found in the transverse colon and ascending       colon. The polyps were 3 to 5 mm in size. These polyps were removed with       a cold snare. Resection and retrieval were complete.      Three sessile polyps were found in the sigmoid colon and descending       colon. The polyps were 3 to 5 mm in size. These polyps were removed with       a cold snare. Resection and retrieval were complete.      The retroflexed view of the distal rectum and anal verge was normal and       showed no anal or rectal abnormalities. Impression:               - Two 3 to 5 mm polyps in the transverse colon and                            in the ascending colon, removed with a cold snare.                            Resected and retrieved.                           - Three 3 to 5 mm polyps in the sigmoid colon and  in the descending colon, removed with a cold snare.                            Resected and retrieved.                           - The distal rectum and anal verge are normal on                            retroflexion view. Moderate Sedation:      Per Anesthesia Care Recommendation:           - Discharge patient to home (ambulatory).                           - Resume previous diet.                           - Await pathology results.                           - Repeat colonoscopy for surveillance based on                             pathology results.                           -Restart Plavix today. Procedure Code(s):        --- Professional ---                           [phone], Colonoscopy, flexible; with removal of                            tumor(s), polyp(s), or other lesion(s) by snare                            technique Diagnosis Code(s):        --- Professional ---                           Z86.010, Personal history of colonic polyps                           D12.3, Benign neoplasm of transverse colon (hepatic                            flexure or splenic flexure)                           D12.2, Benign neoplasm of ascending colon                           D12.5, Benign neoplasm of sigmoid colon                           D12.4, Benign neoplasm of descending colon CPT copyright 2022 American Medical Association. All  rights reserved. The codes documented in this report are preliminary and upon coder review may  be revised to meet current compliance requirements. Katrinka Blazing, MD Katrinka Blazing,  07/16/2022 1:31:22 PM This report has been signed electronically. Number of Addenda: 0

## 2022-07-16 NOTE — H&P (Signed)
Randy [name] is an 72 y.o. male.   Chief Complaint: History of colon polyps HPI: 72 year old male with past medical history of angioedema, BPH, CVA, hyperlipidemia, coming for history of colonic polyps.  Last colonoscopy was performed 5 years ago and there were him, had polyps but no report is available.  The patient denies having any complaints such as melena, hematochezia, abdominal pain or distention, change in her bowel movement consistency or frequency, no changes in weight recently.  No family history of colorectal cancer.    Past Medical History:  Diagnosis Date   Angio-edema    Blepharospasm    BPH (benign prostatic hyperplasia)    Cervical myelopathy    CVA (cerebral vascular accident) 01/2022   Glaucoma 2021   Hyperlipidemia    Nephrolithiasis    Optic neuropathy    Right   Urticaria     Past Surgical History:  Procedure Laterality Date   NECK SURGERY  2015   TONSILLECTOMY      Family History  Problem Relation Age of Onset   Heart attack Father    Dementia Sister    Arthritis Sister    Breast cancer Sister    Healthy Daughter    Healthy Son    Allergic rhinitis Neg Hx    Angioedema Neg Hx    Asthma Neg Hx    Atopy Neg Hx    Eczema Neg Hx    Immunodeficiency Neg Hx    Urticaria Neg Hx    Colon cancer Neg Hx    Social History:  reports that he quit smoking about 5 months ago. His smoking use included cigarettes. He smoked an average of 1 pack per day. He has never used smokeless tobacco. He reports current alcohol use. He reports that he does not use drugs.  Allergies:  Allergies  Allergen Reactions   Hornet Venom Anaphylaxis   Wasp Venom Anaphylaxis   Yellow Jacket Venom [Bee Venom] Anaphylaxis   Other     Dogs and Cats per allergy clinic     Medications Prior to Admission  Medication Sig Dispense Refill   atorvastatin (LIPITOR) 80 MG tablet Take 0.5 tablets (40 mg total) by mouth daily. 30 tablet 0   Carboxymethylcellulose Sod PF (LUBRICATING  PLUS EYE DROPS) 0.5 % SOLN Place 1 drop into both eyes every morning.     carvedilol (COREG) 12.5 MG tablet Take 1 tablet (12.5 mg total) by mouth 2 (two) times daily with a meal. 60 tablet 0   cetirizine (ZYRTEC) 10 MG tablet Take 2 tablets (20 mg total) by mouth 2 (two) times daily. (Patient taking differently: Take 10 mg by mouth daily.) 120 tablet 5   Cholecalciferol (VITAMIN D) 50 MCG (2000 UT) tablet Take 1 tablet (2,000 Units total) by mouth daily. 30 tablet 0   clopidogrel (PLAVIX) 75 MG tablet Take 1 tablet (75 mg total) by mouth daily. 30 tablet 0   DULoxetine (CYMBALTA) 30 MG capsule Take 1 capsule (30 mg total) by mouth daily. 30 capsule 0   famotidine (PEPCID) 20 MG tablet Take 20 mg by mouth at bedtime.     folic acid (FOLVITE) 1 MG tablet Take 1 tablet (1 mg total) by mouth daily. 30 tablet 0   latanoprost (XALATAN) 0.005 % ophthalmic solution Place 1 drop into both eyes at bedtime.     lisinopril (ZESTRIL) 40 MG tablet Take 0.5 tablets (20 mg total) by mouth daily. 30 tablet 0   modafinil (PROVIGIL) 100 MG tablet Take 100 mg  by mouth every morning.     Multiple Vitamins-Minerals (CENTRUM) tablet Take 1 tablet by mouth daily.     naproxen sodium (ANAPROX) 550 MG tablet Take 550 mg by mouth 2 (two) times daily as needed for moderate pain.     omeprazole (PRILOSEC) 20 MG capsule Take 1 capsule (20 mg total) by mouth daily. 30 capsule 0   PARoxetine (PAXIL) 10 MG tablet Take 0.5 tablets (5 mg total) by mouth at bedtime. 30 tablet 0   polyethylene glycol (MIRALAX / GLYCOLAX) 17 g packet Take 17 g by mouth daily. (Patient taking differently: Take 17 g by mouth daily as needed for moderate constipation.) 14 each 0   terazosin (HYTRIN) 2 MG capsule Take 4 mg by mouth at bedtime.      No results found for this or any previous visit (from the past 48 hour(s)). No results found.  Review of Systems  All other systems reviewed and are negative.   There were no vitals taken for this  visit. Physical Exam  GENERAL: The patient is AO x3, in no acute distress. HEENT: Head is normocephalic and atraumatic. EOMI are intact. Mouth is well hydrated and without lesions. NECK: Supple. No masses LUNGS: Clear to auscultation. No presence of rhonchi/wheezing/rales. Adequate chest expansion HEART: RRR, normal s1 and s2. ABDOMEN: Soft, nontender, no guarding, no peritoneal signs, and nondistended. BS +. No masses. EXTREMITIES: Without any cyanosis, clubbing, rash, lesions or edema. NEUROLOGIC: AOx3, no focal motor deficit. SKIN: no jaundice, no rashes  Assessment/Plan 72 year old male with past medical history of angioedema, BPH, CVA, hyperlipidemia, coming for history of colonic polyps.  Will proceed with colonoscopy. Dolores Frame, MD 07/16/2022, 11:29 AM

## 2022-07-16 NOTE — Transfer of Care (Signed)
Immediate Anesthesia Transfer of Care Note  Patient: Randy [name]  Procedure(s) Performed: COLONOSCOPY WITH PROPOFOL POLYPECTOMY  Patient Location: Short Stay  Anesthesia Type:General  Level of Consciousness: awake  Airway & Oxygen Therapy: Patient Spontanous Breathing  Post-op Assessment: Report given to RN  Post vital signs: Reviewed and stable  Last Vitals:  Vitals Value Taken Time  BP    Temp    Pulse    Resp    SpO2      Last Pain:  Vitals:   07/16/22 1249  TempSrc:   PainSc: 0-No pain         Complications: No notable events documented.

## 2022-07-16 NOTE — Anesthesia Preprocedure Evaluation (Signed)
Anesthesia Evaluation  Patient identified by MRN, date of birth, ID band Patient awake    Reviewed: Allergy & Precautions, H&P , NPO status , Patient's Chart, lab work & pertinent test results, reviewed documented beta blocker date and time   Airway Mallampati: II  TM Distance: >3 FB Neck ROM: full    Dental no notable dental hx.    Pulmonary neg pulmonary ROS, former smoker   Pulmonary exam normal breath sounds clear to auscultation       Cardiovascular Exercise Tolerance: Good hypertension,  Rhythm:regular Rate:Normal     Neuro/Psych  Neuromuscular disease CVA  negative psych ROS   GI/Hepatic negative GI ROS, Neg liver ROS,,,  Endo/Other  negative endocrine ROS    Renal/GU negative Renal ROS  negative genitourinary   Musculoskeletal   Abdominal   Peds  Hematology negative hematology ROS (+)   Anesthesia Other Findings   Reproductive/Obstetrics negative OB ROS                             Anesthesia Physical Anesthesia Plan  ASA: 3  Anesthesia Plan: General   Post-op Pain Management:    Induction:   PONV Risk Score and Plan: Propofol infusion  Airway Management Planned:   Additional Equipment:   Intra-op Plan:   Post-operative Plan:   Informed Consent: I have reviewed the patients History and Physical, chart, labs and discussed the procedure including the risks, benefits and alternatives for the proposed anesthesia with the patient or authorized representative who has indicated his/her understanding and acceptance.     Dental Advisory Given  Plan Discussed with: CRNA  Anesthesia Plan Comments:        Anesthesia Quick Evaluation

## 2022-07-16 NOTE — Anesthesia Postprocedure Evaluation (Signed)
Anesthesia Post Note  Patient: Randy [name]  Procedure(s) Performed: COLONOSCOPY WITH PROPOFOL POLYPECTOMY  Patient location during evaluation: Short Stay Anesthesia Type: General Level of consciousness: awake and alert Pain management: pain level controlled Vital Signs Assessment: post-procedure vital signs reviewed and stable Respiratory status: spontaneous breathing Cardiovascular status: blood pressure returned to baseline and stable Postop Assessment: no apparent nausea or vomiting Anesthetic complications: no   No notable events documented.   Last Vitals:  Vitals:   07/16/22 1139  BP: (!) 146/85  Pulse: 77  Resp: 18  Temp: 36.7 C  SpO2: 95%    Last Pain:  Vitals:   07/16/22 1249  TempSrc:   PainSc: 0-No pain                 Jeffie Widdowson

## 2022-07-16 NOTE — Discharge Instructions (Addendum)
You are being discharged to home.  Resume your previous diet.  We are waiting for your pathology results.  Your physician has recommended a repeat colonoscopy for surveillance based on pathology results.  Restart Plavix today. 

## 2022-07-17 ENCOUNTER — Encounter (INDEPENDENT_AMBULATORY_CARE_PROVIDER_SITE_OTHER): Payer: Self-pay | Admitting: *Deleted

## 2022-07-17 LAB — SURGICAL PATHOLOGY

## 2022-07-23 ENCOUNTER — Encounter (HOSPITAL_COMMUNITY): Payer: Self-pay | Admitting: Gastroenterology

## 2022-08-02 ENCOUNTER — Ambulatory Visit: Payer: Medicare PPO | Admitting: Allergy & Immunology

## 2022-08-09 ENCOUNTER — Ambulatory Visit (INDEPENDENT_AMBULATORY_CARE_PROVIDER_SITE_OTHER): Payer: No Typology Code available for payment source | Admitting: Family Medicine

## 2022-08-09 ENCOUNTER — Encounter: Payer: Self-pay | Admitting: Family Medicine

## 2022-08-09 ENCOUNTER — Other Ambulatory Visit: Payer: Self-pay

## 2022-08-09 VITALS — BP 140/82 | HR 84 | Temp 97.9°F | Resp 18

## 2022-08-09 DIAGNOSIS — J3089 Other allergic rhinitis: Secondary | ICD-10-CM

## 2022-08-09 DIAGNOSIS — J302 Other seasonal allergic rhinitis: Secondary | ICD-10-CM | POA: Diagnosis not present

## 2022-08-09 DIAGNOSIS — Z91038 Other insect allergy status: Secondary | ICD-10-CM

## 2022-08-09 DIAGNOSIS — L508 Other urticaria: Secondary | ICD-10-CM | POA: Diagnosis not present

## 2022-08-09 MED ORDER — FAMOTIDINE 20 MG PO TABS: 20.0000 mg | ORAL_TABLET | Freq: Two times a day (BID) | ORAL | 5 refills | Status: AC

## 2022-08-09 MED ORDER — EPINEPHRINE 0.3 MG/0.3ML IJ SOAJ: 0.3000 mg | INTRAMUSCULAR | 1 refills | Status: AC | PRN

## 2022-08-09 MED ORDER — CETIRIZINE HCL 10 MG PO TABS
10.0000 mg | ORAL_TABLET | Freq: Two times a day (BID) | ORAL | 5 refills | Status: DC
Start: 2022-08-09 — End: 2022-10-05

## 2022-08-09 NOTE — Progress Notes (Unsigned)
   [address] [place] [address] Dept: [phone]  FOLLOW UP NOTE  Patient ID: Randy [name], male    DOB: [birth year]  Age: 72 y.o. MRN: [id number] Date of Office Visit: 08/09/2022  Assessment  Chief Complaint: No chief complaint on file.  HPI Randy [name] is a 72 year old male who presents to the clinic for follow-up visit.  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   Drug Allergies:  Allergies  Allergen Reactions   Hornet Venom Anaphylaxis   Wasp Venom Anaphylaxis   Yellow Jacket Venom [Bee Venom] Anaphylaxis   Other     Dogs and Cats per allergy clinic     Physical Exam: There were no vitals taken for this visit.   Physical Exam  Diagnostics:    Assessment and Plan: No diagnosis found.  No orders of the defined types were placed in this encounter.   There are no Patient Instructions on file for this visit.  No follow-ups on file.    Thank you for the opportunity to care for this patient.  Please do not hesitate to contact me with questions.  Thermon Leyland, FNP [facility]

## 2022-08-09 NOTE — Patient Instructions (Signed)
Allergic rhinitis Continue allergen avoidance measures directed toward dust mite, mold, cat, and dog as listed below Consider saline nasal rinses as needed for nasal symptoms. Use this before any medicated nasal sprays for best result  Hives (urticaria) Take the least amount of medications while remaining hive free Cetirizine (Zyrtec) 10mg  twice a day and famotidine (Pepcid) 20 mg twice a day. If no symptoms for 7-14 days then decrease to. Cetirizine (Zyrtec) 10mg  twice a day and famotidine (Pepcid) 20 mg once a day.  If no symptoms for 7-14 days then decrease to. Cetirizine (Zyrtec) 10mg  twice a day.  If no symptoms for 7-14 days then decrease to. Cetirizine (Zyrtec) 10mg  once a day.  May use Benadryl (diphenhydramine) as needed for breakthrough hives       If symptoms return, then step up dosage Keep a detailed symptom journal including foods eaten, contact with allergens, medications taken, weather changes.    Stinging insect allergy In case of an allergic reaction, take Benadryl 50 mg every 4 hours, and if life-threatening symptoms occur, inject with EpiPen 0.3 mg. Restart venom immunotherapy  Call the clinic if this treatment plan is not working well for you.  Follow up in 2 months or sooner if needed.

## 2022-08-11 ENCOUNTER — Encounter: Payer: Self-pay | Admitting: Family Medicine

## 2022-08-26 ENCOUNTER — Ambulatory Visit (INDEPENDENT_AMBULATORY_CARE_PROVIDER_SITE_OTHER): Payer: No Typology Code available for payment source

## 2022-08-26 DIAGNOSIS — Z91038 Other insect allergy status: Secondary | ICD-10-CM

## 2022-09-04 ENCOUNTER — Ambulatory Visit (INDEPENDENT_AMBULATORY_CARE_PROVIDER_SITE_OTHER): Payer: No Typology Code available for payment source

## 2022-09-04 DIAGNOSIS — Z91038 Other insect allergy status: Secondary | ICD-10-CM

## 2022-09-11 ENCOUNTER — Ambulatory Visit (INDEPENDENT_AMBULATORY_CARE_PROVIDER_SITE_OTHER): Payer: No Typology Code available for payment source

## 2022-09-11 DIAGNOSIS — Z91038 Other insect allergy status: Secondary | ICD-10-CM

## 2022-09-16 ENCOUNTER — Ambulatory Visit (INDEPENDENT_AMBULATORY_CARE_PROVIDER_SITE_OTHER): Payer: No Typology Code available for payment source

## 2022-09-16 DIAGNOSIS — Z91038 Other insect allergy status: Secondary | ICD-10-CM | POA: Diagnosis not present

## 2022-09-23 ENCOUNTER — Ambulatory Visit (INDEPENDENT_AMBULATORY_CARE_PROVIDER_SITE_OTHER): Payer: No Typology Code available for payment source

## 2022-09-23 DIAGNOSIS — Z91038 Other insect allergy status: Secondary | ICD-10-CM

## 2022-09-24 NOTE — Progress Notes (Unsigned)
No chief complaint on file.   HISTORY OF PRESENT ILLNESS:  09/24/22 ALL:  Randy [name] is a 72 y.o. male here today for follow up for CVA 01/2022. He was seen in consult with Dr Terrace Arabia 03/2022. Advised to stop asa and continue Plavix and atorvastatin. MRI ordered of cervical and lumbar spine for concerns of back pain, brisk reflexes and urinary urgency.  Since,   Cervical and lumbar imaging not completed.   Cervical imaging with [facility] 11/2021 showed: 1. Multilevel facet and uncovertebral hypertrophy causes severe neural foraminal narrowing on the right at C6-C7, moderate to severe neural foraminal narrowing on the left at C6-C7, moderate neural foraminal narrowing bilaterally at C5-C6 and on the right at C4-C5, and mild neural  foraminal narrowing on the left at C4-C5.  2. C3-C4 mild spinal canal stenosis with moderate to severe right  and moderate left neural foraminal narrowing.  3. C7-T1 moderate bilateral neural foraminal narrowing.  4. C2-C3 mild right neural foraminal narrowing.   HISTORY (copied from Dr Zannie Cove previous note)  Randy [name] is a 72 year old male, seen in request by his primary care PA   Angiulli, Reuel Boom to follow-up for stroke, initial evaluation December 13, 2021   I reviewed and summarized the referring note. PMHX. HLD HTN Cervical decompression, Jan 2016, Glaucoma Right optic neuropathy, different shades of color,  Used to smoke 1/2 ppd, quit since stroke in Oct 2023. Used to drink alcohol moderately    He was admitted to the hospital January 20, 2022 for stroke after transient loss of consciousness, developed left-sided weakness,   Personally reviewed MRI of the brain, acute right pontine paramedian stroke, measuring up to 9 mm, moderate generalized atrophy, MRA of the brain, evidence of intracranial atherosclerotic disease,   Echocardiogram 45 to 50%, no evidence of intra ventricular thrombus   He was on aspirin 81 mg daily was discharged home  with 75 mg Plavix, supposed to on dual antiplatelet agent for 3 weeks then single agent, he is getting Plavix from [place], still taking both   He recovered well, able to ambulate without assistant, still has mild residual left arm and leg weakness, unsteady gait,   He complains of longstanding history of low back pain, mild unsteady gait, now with increased difficulty also worsening urinary urgency   REVIEW OF SYSTEMS: Out of a complete 14 system review of symptoms, the patient complains only of the following symptoms, back pain and all other reviewed systems are negative.   ALLERGIES: Allergies  Allergen Reactions   Hornet Venom Anaphylaxis   Wasp Venom Anaphylaxis   Yellow Jacket Venom [Bee Venom] Anaphylaxis   Other     Dogs and Cats per allergy clinic      HOME MEDICATIONS: Outpatient Medications Prior to Visit  Medication Sig Dispense Refill   atorvastatin (LIPITOR) 80 MG tablet Take 0.5 tablets (40 mg total) by mouth daily. 30 tablet 0   Carboxymethylcellulose Sod PF (LUBRICATING PLUS EYE DROPS) 0.5 % SOLN Place 1 drop into both eyes every morning.     carvedilol (COREG) 12.5 MG tablet Take 1 tablet (12.5 mg total) by mouth 2 (two) times daily with a meal. 60 tablet 0   cetirizine (ZYRTEC) 10 MG tablet Take 1 tablet (10 mg total) by mouth 2 (two) times daily. 60 tablet 5   Cholecalciferol (VITAMIN D) 50 MCG (2000 UT) tablet Take 1 tablet (2,000 Units total) by mouth daily. 30 tablet 0   clopidogrel (PLAVIX) 75 MG tablet Take 1 tablet (  75 mg total) by mouth daily. 30 tablet 0   DULoxetine (CYMBALTA) 30 MG capsule Take 1 capsule (30 mg total) by mouth daily. 30 capsule 0   EPINEPHrine (EPIPEN 2-PAK) 0.3 mg/0.3 mL IJ SOAJ injection Inject 0.3 mg into the muscle as needed for anaphylaxis. 0.3 mL 1   famotidine (PEPCID) 20 MG tablet Take 1 tablet (20 mg total) by mouth 2 (two) times daily. 60 tablet 5   folic acid (FOLVITE) 1 MG tablet Take 1 tablet (1 mg total) by mouth daily. 30  tablet 0   latanoprost (XALATAN) 0.005 % ophthalmic solution Place 1 drop into both eyes at bedtime.     lisinopril (ZESTRIL) 40 MG tablet Take 0.5 tablets (20 mg total) by mouth daily. 30 tablet 0   modafinil (PROVIGIL) 100 MG tablet Take 100 mg by mouth every morning.     Multiple Vitamins-Minerals (CENTRUM) tablet Take 1 tablet by mouth daily.     naproxen sodium (ANAPROX) 550 MG tablet Take 550 mg by mouth 2 (two) times daily as needed for moderate pain.     omeprazole (PRILOSEC) 20 MG capsule Take 1 capsule (20 mg total) by mouth daily. 30 capsule 0   PARoxetine (PAXIL) 10 MG tablet Take 0.5 tablets (5 mg total) by mouth at bedtime. 30 tablet 0   polyethylene glycol (MIRALAX / GLYCOLAX) 17 g packet Take 17 g by mouth daily. (Patient taking differently: Take 17 g by mouth daily as needed for moderate constipation.) 14 each 0   terazosin (HYTRIN) 2 MG capsule Take 4 mg by mouth at bedtime.     No facility-administered medications prior to visit.     PAST MEDICAL HISTORY: Past Medical History:  Diagnosis Date   Angio-edema    Blepharospasm    BPH (benign prostatic hyperplasia)    Cervical myelopathy (HCC)    CVA (cerebral vascular accident) (HCC) 01/2022   Glaucoma 2021   Hyperlipidemia    Nephrolithiasis    Optic neuropathy    Right   Urticaria      PAST SURGICAL HISTORY: Past Surgical History:  Procedure Laterality Date   COLONOSCOPY WITH PROPOFOL N/A 07/16/2022   Procedure: COLONOSCOPY WITH PROPOFOL;  Surgeon: Dolores Frame, MD;  Location: [facility];  Service: Gastroenterology;  Laterality: N/A;  10:15am, asa 3   NECK SURGERY  2015   POLYPECTOMY  07/16/2022   Procedure: POLYPECTOMY;  Surgeon: Dolores Frame, MD;  Location: [facility];  Service: Gastroenterology;;   TONSILLECTOMY       FAMILY HISTORY: Family History  Problem Relation Age of Onset   Heart attack Father    Dementia Sister    Arthritis Sister    Breast cancer Sister     Healthy Daughter    Healthy Son    Allergic rhinitis Neg Hx    Angioedema Neg Hx    Asthma Neg Hx    Atopy Neg Hx    Eczema Neg Hx    Immunodeficiency Neg Hx    Urticaria Neg Hx    Colon cancer Neg Hx      SOCIAL HISTORY: Social History   Socioeconomic History   Marital status: Married    Spouse name: Not on file   Number of children: Not on file   Years of education: Not on file   Highest education level: Not on file  Occupational History   Not on file  Tobacco Use   Smoking status: Former    Packs/day: 1  Types: Cigarettes    Quit date: 01/25/2022    Years since quitting: 0.6   Smokeless tobacco: Never  Vaping Use   Vaping Use: Never used  Substance and Sexual Activity   Alcohol use: Yes    Comment: 2 5ths per week   Drug use: No   Sexual activity: Not on file  Other Topics Concern   Not on file  Social History Narrative   Not on file   Social Determinants of Health   Financial Resource Strain: Not on file  Food Insecurity: No Food Insecurity (01/21/2022)   Hunger Vital Sign    Worried About Running Out of Food in the Last Year: Never true    Ran Out of Food in the Last Year: Never true  Transportation Needs: No Transportation Needs (01/21/2022)   PRAPARE - Administrator, Civil Service (Medical): No    Lack of Transportation (Non-Medical): No  Physical Activity: Not on file  Stress: Not on file  Social Connections: Not on file  Intimate Partner Violence: Not At Risk (01/21/2022)   Humiliation, Afraid, Rape, and Kick questionnaire    Fear of Current or Ex-Partner: No    Emotionally Abused: No    Physically Abused: No    Sexually Abused: No     PHYSICAL EXAM  There were no vitals filed for this visit. There is no height or weight on file to calculate BMI.  Generalized: Well developed, in no acute distress  Cardiology: normal rate and rhythm, no murmur auscultated  Respiratory: clear to auscultation bilaterally    Neurological  examination  Mentation: Alert oriented to time, place, history taking. Follows all commands speech and language fluent Cranial nerve II-XII: Pupils were equal round reactive to light. Extraocular movements were full, visual field were full on confrontational test. Facial sensation and strength were normal. Uvula tongue midline. Head turning and shoulder shrug  were normal and symmetric. Motor: The motor testing reveals 5 over 5 strength of all 4 extremities. Good symmetric motor tone is noted throughout.  Sensory: Sensory testing is intact to soft touch on all 4 extremities. No evidence of extinction is noted.  Coordination: Cerebellar testing reveals good finger-nose-finger and heel-to-shin bilaterally.  Gait and station: Gait is normal. Tandem gait is normal. Romberg is negative. No drift is seen.  Reflexes: Deep tendon reflexes are symmetric and normal bilaterally.    DIAGNOSTIC DATA (LABS, IMAGING, TESTING) - I reviewed patient records, labs, notes, testing and imaging myself where available.  Lab Results  Component Value Date   WBC 4.8 02/05/2022   HGB 17.3 (H) 02/05/2022   HCT 49.0 02/05/2022   MCV 93.0 02/05/2022   PLT 233 02/05/2022      Component Value Date/Time   NA 138 02/05/2022 0509   K 4.4 02/05/2022 0509   CL 102 02/05/2022 0509   CO2 27 02/05/2022 0509   GLUCOSE 127 (H) 02/05/2022 0509   BUN 23 02/05/2022 0509   CREATININE 1.12 02/05/2022 0509   CREATININE 1.00 10/07/2016 1040   CALCIUM 10.0 02/05/2022 0509   PROT 6.3 (L) 01/28/2022 0509   ALBUMIN 3.4 (L) 01/28/2022 0509   AST 38 01/28/2022 0509   ALT 54 (H) 01/28/2022 0509   ALKPHOS 65 01/28/2022 0509   BILITOT 0.7 01/28/2022 0509   GFRNONAA >60 02/05/2022 0509   GFRNONAA 78 10/07/2016 1040   GFRAA >89 10/07/2016 1040   Lab Results  Component Value Date   CHOL 149 01/21/2022   HDL 33 (L) 01/21/2022  LDLCALC 90 01/21/2022   TRIG 131 01/21/2022   CHOLHDL 4.5 01/21/2022   Lab Results  Component Value  Date   HGBA1C 5.8 (H) 01/21/2022   Lab Results  Component Value Date   VITAMINB12 301 01/21/2022   Lab Results  Component Value Date   TSH 0.73 10/07/2016        No data to display               No data to display           ASSESSMENT AND PLAN  72 y.o. year old male  has a past medical history of Angio-edema, Blepharospasm, BPH (benign prostatic hyperplasia), Cervical myelopathy (HCC), CVA (cerebral vascular accident) (HCC) (01/2022), Glaucoma (2021), Hyperlipidemia, Nephrolithiasis, Optic neuropathy, and Urticaria. here with    No diagnosis found.  Arlester Marker ***.  Healthy lifestyle habits encouraged. *** will follow up with PCP as directed. *** will return to see me in ***, sooner if needed. *** verbalizes understanding and agreement with this plan.   No orders of the defined types were placed in this encounter.    No orders of the defined types were placed in this encounter.    Shawnie Dapper, MSN, FNP-C 09/24/2022, 7:57 AM  [facility] [address] [place], [place] [address] [phone]

## 2022-09-24 NOTE — Patient Instructions (Incomplete)

## 2022-09-25 ENCOUNTER — Ambulatory Visit (INDEPENDENT_AMBULATORY_CARE_PROVIDER_SITE_OTHER): Payer: Medicare PPO | Admitting: Family Medicine

## 2022-09-25 ENCOUNTER — Encounter: Payer: Self-pay | Admitting: Family Medicine

## 2022-09-25 VITALS — BP 149/87 | HR 83 | Ht 70.0 in | Wt 232.0 lb

## 2022-09-25 DIAGNOSIS — I639 Cerebral infarction, unspecified: Secondary | ICD-10-CM | POA: Diagnosis not present

## 2022-09-30 ENCOUNTER — Ambulatory Visit (INDEPENDENT_AMBULATORY_CARE_PROVIDER_SITE_OTHER): Payer: No Typology Code available for payment source

## 2022-09-30 DIAGNOSIS — Z91038 Other insect allergy status: Secondary | ICD-10-CM | POA: Diagnosis not present

## 2022-10-05 ENCOUNTER — Emergency Department (HOSPITAL_COMMUNITY)
Admission: EM | Admit: 2022-10-05 | Discharge: 2022-10-05 | Disposition: A | Discharge status: Home / Self Care | Payer: No Typology Code available for payment source | Attending: Emergency Medicine | Admitting: Emergency Medicine

## 2022-10-05 ENCOUNTER — Encounter (HOSPITAL_COMMUNITY): Payer: Self-pay | Admitting: Emergency Medicine

## 2022-10-05 ENCOUNTER — Other Ambulatory Visit: Payer: Self-pay

## 2022-10-05 ENCOUNTER — Emergency Department (HOSPITAL_COMMUNITY): Payer: No Typology Code available for payment source

## 2022-10-05 DIAGNOSIS — Z79899 Other long term (current) drug therapy: Secondary | ICD-10-CM | POA: Diagnosis not present

## 2022-10-05 DIAGNOSIS — I10 Essential (primary) hypertension: Secondary | ICD-10-CM | POA: Insufficient documentation

## 2022-10-05 DIAGNOSIS — R0602 Shortness of breath: Secondary | ICD-10-CM | POA: Diagnosis present

## 2022-10-05 HISTORY — DX: Essential (primary) hypertension: I10

## 2022-10-05 MED ORDER — LISINOPRIL 10 MG PO TABS
20.0000 mg | ORAL_TABLET | Freq: Once | ORAL | Status: AC
  Administered 2022-10-05: 20 mg via ORAL
  Filled 2022-10-05: qty 2

## 2022-10-05 NOTE — ED Provider Notes (Signed)
[place] EMERGENCY DEPARTMENT AT [facility] Emergency Dept Provider Note   CSN: [id number] Arrival date & time: 10/05/22  1351     History {Add pertinent medical, surgical, social history, OB history to HPI:1} Chief Complaint  Patient presents with   Hypertension   Shortness of Breath    Amrom Maskell is a 72 y.o. male.  Patient is concerned about her blood pressure.  It has been running high recently and he talked to his doctor assigned to the emergency department   Hypertension  Shortness of Breath      Home Medications Prior to Admission medications   Medication Sig Start Date End Date Taking? Authorizing Provider  abobotulinumtoxinA (DYSPORT) 300 units SOLR injection Inject into the muscle. 08/12/22  Yes [provider]  atorvastatin (LIPITOR) 80 MG tablet Take 0.5 tablets by mouth daily. 09/06/22  Yes [provider]  Carboxymethylcellulose Sod PF (LUBRICATING PLUS EYE DROPS) 0.5 % SOLN Place 1 drop into both eyes every morning.   Yes [provider]  carvedilol (COREG) 12.5 MG tablet Take 1 tablet (12.5 mg total) by mouth 2 (two) times daily with a meal. 02/04/22  Yes Angiulli, Mcarthur Rossetti, PA-C  cetirizine (ZYRTEC) 10 MG tablet Take 10 mg by mouth daily.   Yes [provider]  Cholecalciferol (VITAMIN D) 50 MCG (2000 UT) tablet Take 1 tablet (2,000 Units total) by mouth daily. 02/04/22  Yes Angiulli, Mcarthur Rossetti, PA-C  clopidogrel (PLAVIX) 75 MG tablet Take 1 tablet (75 mg total) by mouth daily. 02/04/22  Yes Angiulli, Mcarthur Rossetti, PA-C  DULoxetine (CYMBALTA) 30 MG capsule Take 1 capsule (30 mg total) by mouth daily. 02/04/22  Yes Angiulli, Mcarthur Rossetti, PA-C  EPINEPHrine (EPIPEN 2-PAK) 0.3 mg/0.3 mL IJ SOAJ injection Inject 0.3 mg into the muscle as needed for anaphylaxis. 08/09/22  Yes Ambs, Norvel Richards, FNP  famotidine (PEPCID) 20 MG tablet Take 1 tablet (20 mg total) by mouth 2 (two) times daily. Patient taking differently: Take 20 mg by mouth daily.  08/09/22  Yes Ambs, Norvel Richards, FNP  folic acid (FOLVITE) 1 MG tablet Take 1 tablet (1 mg total) by mouth daily. 02/04/22  Yes Angiulli, Mcarthur Rossetti, PA-C  latanoprost (XALATAN) 0.005 % ophthalmic solution Place 1 drop into both eyes at bedtime. 09/07/21  Yes [provider]  lisinopril (ZESTRIL) 40 MG tablet Take 0.5 tablets (20 mg total) by mouth daily. 02/04/22  Yes Angiulli, Mcarthur Rossetti, PA-C  modafinil (PROVIGIL) 100 MG tablet Take 100 mg by mouth every morning. 04/09/22  Yes [provider]  Multiple Vitamins-Minerals (CENTRUM) tablet Take 1 tablet by mouth daily.   Yes [provider]  naproxen sodium (ANAPROX) 550 MG tablet Take 550 mg by mouth 2 (two) times daily as needed for moderate pain.   Yes [provider]  omeprazole (PRILOSEC) 20 MG capsule Take 1 capsule (20 mg total) by mouth daily. 02/04/22  Yes Angiulli, Mcarthur Rossetti, PA-C  PARoxetine (PAXIL) 10 MG tablet Take 0.5 tablets (5 mg total) by mouth at bedtime. 02/04/22  Yes Angiulli, Mcarthur Rossetti, PA-C  polyethylene glycol (MIRALAX / GLYCOLAX) 17 g packet Take 17 g by mouth daily. Patient taking differently: Take 17 g by mouth daily as needed for moderate constipation. 02/04/22  Yes Angiulli, Mcarthur Rossetti, PA-C  terazosin (HYTRIN) 2 MG capsule Take 4 mg by mouth at bedtime. 04/09/22  Yes [provider]      Allergies    Hornet venom, Wasp venom, Yellow jacket venom [bee venom], and  Other    Review of Systems   Review of Systems  Physical Exam Updated Vital Signs BP (!) 174/115   Pulse 62   Temp 98.4 F (36.9 C) (Oral)   Resp (!) 24   Ht 5\' 10"  (1.778 m)   Wt 105.2 kg   SpO2 95%   BMI 33.29 kg/m  Physical Exam  ED Results / Procedures / Treatments   Labs (all labs ordered are listed, but only abnormal results are displayed) Labs Reviewed - No data to display  EKG EKG Interpretation Date/Time:  Saturday October 05 2022 14:50:10 EDT Ventricular Rate:  63 PR Interval:  146 QRS Duration:  74 QT  Interval:  400 QTC Calculation: 409 R Axis:   38  Text Interpretation: Normal sinus rhythm Normal ECG When compared with ECG of 20-Jan-2022 21:59, PREVIOUS ECG IS PRESENT Confirmed by Bethann Berkshire [phone]) on 10/05/2022 2:56:40 PM  Radiology No results found.  Procedures Procedures  {Document cardiac monitor, telemetry assessment procedure when appropriate:1}  Medications Ordered in ED Medications  lisinopril (ZESTRIL) tablet 20 mg (has no administration in time range)    ED Course/ Medical Decision Making/ A&P   {   Click here for ABCD2, HEART and other calculatorsREFRESH Note before signing :1}                          Medical Decision Making Amount and/or Complexity of Data Reviewed Radiology: ordered.  Risk Prescription drug management.   Patient with poorly controlled blood pressure.  Will increase his lisinopril so he is taking 40 mg a day and follow-up with PCP  {Document critical care time when appropriate:1} {Document review of labs and clinical decision tools ie heart score, Chads2Vasc2 etc:1}  {Document your independent review of radiology images, and any outside records:1} {Document your discussion with family members, caretakers, and with consultants:1} {Document social determinants of health affecting pt's care:1} {Document your decision making why or why not admission, treatments were needed:1} Final Clinical Impression(s) / ED Diagnoses Final diagnoses:  Primary hypertension    Rx / DC Orders ED Discharge Orders     None

## 2022-10-05 NOTE — Discharge Instructions (Signed)
Increase your lisinopril to 40mg  or a whole pill a day and follow up with your md in 1-2 weeks

## 2022-10-05 NOTE — ED Triage Notes (Signed)
Pt ambulatory to triage with c/o elevated BP.  States it has been elevated for the last week.  States is taking his prescribed medications without change.  Denies neurological symptoms at this time.  States he contacted the [place] and was advised to come here.  Pt also endorses SHOB.

## 2022-10-07 ENCOUNTER — Ambulatory Visit: Payer: No Typology Code available for payment source

## 2022-10-14 ENCOUNTER — Ambulatory Visit: Payer: No Typology Code available for payment source

## 2022-10-16 ENCOUNTER — Ambulatory Visit: Payer: No Typology Code available for payment source | Admitting: Family Medicine

## 2022-10-16 NOTE — Progress Notes (Deleted)
   [address] [place] [address] Dept: [phone]  FOLLOW UP NOTE  Patient ID: Randy [name], male    DOB: 08-23-1950  Age: 72 y.o. MRN: [id number] Date of Office Visit: 10/16/2022  Assessment  Chief Complaint: No chief complaint on file.  HPI Randy [name] is a 72 year old male who presents to the clinic for follow-up visit.  He was last seen in this clinic on 08/09/2022 by Thermon Leyland, FNP, for evaluation of allergic rhinitis, urticaria, and stinging insect allergy.   His last environmental allergy testing was on 10/01/2016 and was positive to dust mite, mold, cat, and dog.  He restarted venom immunotherapy on 08/26/2022  Drug Allergies:  Allergies  Allergen Reactions   Hornet Venom Anaphylaxis   Wasp Venom Anaphylaxis   Yellow Jacket Venom [Bee Venom] Anaphylaxis   Other     Dogs and Cats per allergy clinic     Physical Exam: There were no vitals taken for this visit.   Physical Exam  Diagnostics:    Assessment and Plan: No diagnosis found.  No orders of the defined types were placed in this encounter.   There are no Patient Instructions on file for this visit.  No follow-ups on file.    Thank you for the opportunity to care for this patient.  Please do not hesitate to contact me with questions.  Thermon Leyland, FNP [facility]

## 2022-10-21 ENCOUNTER — Ambulatory Visit: Payer: No Typology Code available for payment source

## 2022-10-21 DIAGNOSIS — Z91038 Other insect allergy status: Secondary | ICD-10-CM

## 2022-10-28 ENCOUNTER — Ambulatory Visit (INDEPENDENT_AMBULATORY_CARE_PROVIDER_SITE_OTHER): Payer: No Typology Code available for payment source

## 2022-10-28 DIAGNOSIS — Z91038 Other insect allergy status: Secondary | ICD-10-CM | POA: Diagnosis not present

## 2022-11-04 ENCOUNTER — Ambulatory Visit (INDEPENDENT_AMBULATORY_CARE_PROVIDER_SITE_OTHER): Payer: No Typology Code available for payment source

## 2022-11-04 DIAGNOSIS — Z91038 Other insect allergy status: Secondary | ICD-10-CM | POA: Diagnosis not present

## 2022-11-11 ENCOUNTER — Ambulatory Visit: Payer: Medicare PPO

## 2022-11-12 ENCOUNTER — Telehealth: Payer: Self-pay | Admitting: Family Medicine

## 2022-11-12 NOTE — Telephone Encounter (Signed)
Patient's current authorization, [id number] expires on 11-30-2022.   Faxed renewal of services request to [facility], [phone]  Called and spoke to patient. Advised him of expiration date and to reach out to PCP for renewal. Patient verbalized understanding.

## 2022-11-18 ENCOUNTER — Ambulatory Visit (INDEPENDENT_AMBULATORY_CARE_PROVIDER_SITE_OTHER): Payer: No Typology Code available for payment source

## 2022-11-18 DIAGNOSIS — Z91038 Other insect allergy status: Secondary | ICD-10-CM

## 2022-11-25 ENCOUNTER — Ambulatory Visit (INDEPENDENT_AMBULATORY_CARE_PROVIDER_SITE_OTHER): Payer: No Typology Code available for payment source

## 2022-11-25 DIAGNOSIS — Z91038 Other insect allergy status: Secondary | ICD-10-CM | POA: Diagnosis not present

## 2022-12-02 ENCOUNTER — Ambulatory Visit: Payer: Medicare PPO

## 2022-12-02 NOTE — Telephone Encounter (Signed)
Received new authorization, [id number] with preliminary dates of 11-18-2022 to 11-18-2023.   Authorization indexed to media: AMB Referral - [facility] AUTH. PRELIMINARY EXP 11-18-2023

## 2023-01-13 NOTE — Telephone Encounter (Signed)
Patient called and expressed that the [facility] called him and said that he could resume his treatment with Allergy and Asthma as well as immunotherapy. Patient wanted to confirm. Randy [name] can you confirm?  If approved he'd like to resume immunotherapy. Please advise on where patient should start as his last venom injections were on August 19 and he received 0.10 of 0.01 of the wasp and 0.10 of 0.03 of the Mixed Vespid.   Thank you.

## 2023-01-15 NOTE — Telephone Encounter (Signed)
Let's decrease back to 0.05 mL of the 0.03 mcg/mL of the mixed vespid and 0.05 mL of the 0.01 mcg/mL of the wasp.  Malachi Bonds, MD [facility]

## 2023-01-21 NOTE — Telephone Encounter (Addendum)
Left a message for the patient to call the office regarding the Covid Vaccine. We did receive a few Covid Vaccines and was wondering if patient was still interested in receiving the Vaccine.

## 2023-01-28 NOTE — Telephone Encounter (Signed)
Patient called back regarding the Covid Vaccine and is interested in getting the vaccine.

## 2023-02-04 NOTE — Telephone Encounter (Signed)
Called and left a message for patient to call our office back to get him on the schedule for Monday November 4th to restart his venom injections.

## 2023-03-18 ENCOUNTER — Other Ambulatory Visit (HOSPITAL_COMMUNITY): Payer: Self-pay | Admitting: Nurse Practitioner

## 2023-03-18 DIAGNOSIS — Z0189 Encounter for other specified special examinations: Secondary | ICD-10-CM

## 2023-03-18 DIAGNOSIS — Z87891 Personal history of nicotine dependence: Secondary | ICD-10-CM

## 2023-03-27 ENCOUNTER — Ambulatory Visit: Payer: No Typology Code available for payment source | Admitting: Neurology

## 2023-03-27 ENCOUNTER — Telehealth: Payer: Self-pay | Admitting: Family Medicine

## 2023-03-27 NOTE — Telephone Encounter (Signed)
Pt called to r/s appt. Rescheduled

## 2023-04-04 ENCOUNTER — Ambulatory Visit (HOSPITAL_COMMUNITY)
Admission: RE | Admit: 2023-04-04 | Discharge: 2023-04-04 | Disposition: A | Discharge status: Home / Self Care | Payer: No Typology Code available for payment source | Source: Ambulatory Visit | Attending: Nurse Practitioner | Admitting: Nurse Practitioner

## 2023-04-04 DIAGNOSIS — Z87891 Personal history of nicotine dependence: Secondary | ICD-10-CM | POA: Insufficient documentation

## 2023-04-04 DIAGNOSIS — Z0189 Encounter for other specified special examinations: Secondary | ICD-10-CM | POA: Insufficient documentation

## 2023-05-13 ENCOUNTER — Telehealth: Payer: Self-pay | Admitting: Family Medicine

## 2023-05-13 NOTE — Telephone Encounter (Signed)
 Appointment details confirmed

## 2023-07-14 ENCOUNTER — Ambulatory Visit (INDEPENDENT_AMBULATORY_CARE_PROVIDER_SITE_OTHER): Payer: No Typology Code available for payment source | Admitting: Neurology

## 2023-07-14 ENCOUNTER — Encounter: Payer: Self-pay | Admitting: Neurology

## 2023-07-14 VITALS — BP 117/77 | HR 85 | Ht 70.0 in | Wt 243.0 lb

## 2023-07-14 DIAGNOSIS — I639 Cerebral infarction, unspecified: Secondary | ICD-10-CM

## 2023-07-14 DIAGNOSIS — G245 Blepharospasm: Secondary | ICD-10-CM | POA: Insufficient documentation

## 2023-07-14 NOTE — Progress Notes (Signed)
 Chief Complaint  Patient presents with   Follow-up    Pt in 15, HERE alone Pt is here for follow up on CVA. Pt states he is doing well. States has no concerns.      ASSESSMENT AND PLAN: Stroke:  Vascular risk factor of hypertension, hyperlipidemia, smoking, drinking  Continue Plavix 75 mg daily,  Encourage moderate exercise   Follow-up with primary care and return to clinic for new issues    HISTORY  Randy [name] is a 73 year old male, seen in request by his primary care PA   Angiulli, Reuel Boom to follow-up for stroke, initial evaluation December 13, 2021   I reviewed and summarized the referring note. PMHX. HLD HTN Cervical decompression, Jan 2016, Glaucoma Right optic neuropathy, different shades of color,  Used to smoke 1/2 ppd, quit since stroke in Oct 2023. Used to drink alcohol moderately    He was admitted to the hospital January 20, 2022 for stroke after transient loss of consciousness, developed left-sided weakness,  MRI of the brain, acute right pontine paramedian stroke, measuring up to 9 mm, moderate generalized atrophy, MRA of the brain, evidence of intracranial atherosclerotic disease,   Echocardiogram 45 to 50%, no evidence of intra ventricular thrombus   He was on aspirin 81 mg daily was discharged home with 75 mg Plavix, supposed to on dual antiplatelet agent for 3 weeks then single agent, he is getting Plavix from [place], still taking both   He recovered well, able to ambulate without assistant, still has mild residual left arm and leg weakness, unsteady gait,   He complains of longstanding history of low back pain, mild unsteady gait, now with increased difficulty also worsening urinary urgency  UPDATE July 14 2023: He has no recurrent strokelike symptoms, mild unsteady gait, abdominal hernia, taking Plavix 75 mg daily alone.   REVIEW OF SYSTEMS: Out of a complete 14 system review of symptoms, the patient complains only of the following symptoms,  back pain and all other reviewed systems are negative.   ALLERGIES: Allergies  Allergen Reactions   Hornet Venom Anaphylaxis   Wasp Venom Anaphylaxis   Yellow Jacket Venom [Bee Venom] Anaphylaxis   Other     Dogs and Cats per allergy clinic      HOME MEDICATIONS: Outpatient Medications Prior to Visit  Medication Sig Dispense Refill   abobotulinumtoxinA (DYSPORT) 300 units SOLR injection Inject into the muscle.     atorvastatin (LIPITOR) 80 MG tablet Take 0.5 tablets by mouth daily.     Carboxymethylcellulose Sod PF (LUBRICATING PLUS EYE DROPS) 0.5 % SOLN Place 1 drop into both eyes every morning.     carvedilol (COREG) 12.5 MG tablet Take 1 tablet (12.5 mg total) by mouth 2 (two) times daily with a meal. 60 tablet 0   cetirizine (ZYRTEC) 10 MG tablet Take 10 mg by mouth daily.     Cholecalciferol (VITAMIN D) 50 MCG (2000 UT) tablet Take 1 tablet (2,000 Units total) by mouth daily. 30 tablet 0   clopidogrel (PLAVIX) 75 MG tablet Take 1 tablet (75 mg total) by mouth daily. 30 tablet 0   DULoxetine (CYMBALTA) 30 MG capsule Take 1 capsule (30 mg total) by mouth daily. 30 capsule 0   EPINEPHrine (EPIPEN 2-PAK) 0.3 mg/0.3 mL IJ SOAJ injection Inject 0.3 mg into the muscle as needed for anaphylaxis. 0.3 mL 1   famotidine (PEPCID) 20 MG tablet Take 1 tablet (20 mg total) by mouth 2 (two) times daily. (Patient taking differently: Take 20  mg by mouth daily.) 60 tablet 5   folic acid (FOLVITE) 1 MG tablet Take 1 tablet (1 mg total) by mouth daily. 30 tablet 0   latanoprost (XALATAN) 0.005 % ophthalmic solution Place 1 drop into both eyes at bedtime.     lisinopril (ZESTRIL) 40 MG tablet Take 0.5 tablets (20 mg total) by mouth daily. 30 tablet 0   modafinil (PROVIGIL) 100 MG tablet Take 100 mg by mouth every morning.     Multiple Vitamins-Minerals (CENTRUM) tablet Take 1 tablet by mouth daily.     naproxen sodium (ANAPROX) 550 MG tablet Take 550 mg by mouth 2 (two) times daily as needed for  moderate pain.     omeprazole (PRILOSEC) 20 MG capsule Take 1 capsule (20 mg total) by mouth daily. 30 capsule 0   PARoxetine (PAXIL) 10 MG tablet Take 0.5 tablets (5 mg total) by mouth at bedtime. 30 tablet 0   polyethylene glycol (MIRALAX / GLYCOLAX) 17 g packet Take 17 g by mouth daily. (Patient taking differently: Take 17 g by mouth daily as needed for moderate constipation.) 14 each 0   terazosin (HYTRIN) 2 MG capsule Take 4 mg by mouth at bedtime.     No facility-administered medications prior to visit.     PAST MEDICAL HISTORY: Past Medical History:  Diagnosis Date   Angio-edema    Blepharospasm    BPH (benign prostatic hyperplasia)    Cervical myelopathy (HCC)    CVA (cerebral vascular accident) (HCC) 01/2022   Glaucoma 2021   Hyperlipidemia    Hypertension    Nephrolithiasis    Optic neuropathy    Right   Urticaria      PAST SURGICAL HISTORY: Past Surgical History:  Procedure Laterality Date   COLONOSCOPY WITH PROPOFOL N/A 07/16/2022   Procedure: COLONOSCOPY WITH PROPOFOL;  Surgeon: Dolores Frame, MD;  Location: [facility];  Service: Gastroenterology;  Laterality: N/A;  10:15am, asa 3   NECK SURGERY  2015   POLYPECTOMY  07/16/2022   Procedure: POLYPECTOMY;  Surgeon: Marguerita Merles, Reuel Boom, MD;  Location: [facility];  Service: Gastroenterology;;   TONSILLECTOMY       FAMILY HISTORY: Family History  Problem Relation Age of Onset   Heart attack Father    Dementia Sister    Arthritis Sister    Breast cancer Sister    Healthy Daughter    Healthy Son    Allergic rhinitis Neg Hx    Angioedema Neg Hx    Asthma Neg Hx    Atopy Neg Hx    Eczema Neg Hx    Immunodeficiency Neg Hx    Urticaria Neg Hx    Colon cancer Neg Hx      SOCIAL HISTORY: Social History   Socioeconomic History   Marital status: Married    Spouse name: Not on file   Number of children: Not on file   Years of education: Not on file   Highest education level: Not on  file  Occupational History   Not on file  Tobacco Use   Smoking status: Former    Current packs/day: 0.00    Types: Cigarettes    Quit date: 01/25/2022    Years since quitting: 1.4   Smokeless tobacco: Never  Vaping Use   Vaping status: Never Used  Substance and Sexual Activity   Alcohol use: Yes    Comment: 2 5ths per week   Drug use: No   Sexual activity: Not on file  Other Topics  Concern   Not on file  Social History Narrative   Not on file   Social Drivers of Health   Financial Resource Strain: Not on file  Food Insecurity: No Food Insecurity (01/21/2022)   Hunger Vital Sign    Worried About Running Out of Food in the Last Year: Never true    Ran Out of Food in the Last Year: Never true  Transportation Needs: No Transportation Needs (01/21/2022)   PRAPARE - Administrator, Civil Service (Medical): No    Lack of Transportation (Non-Medical): No  Physical Activity: Not on file  Stress: Not on file  Social Connections: Not on file  Intimate Partner Violence: Not At Risk (01/21/2022)   Humiliation, Afraid, Rape, and Kick questionnaire    Fear of Current or Ex-Partner: No    Emotionally Abused: No    Physically Abused: No    Sexually Abused: No     PHYSICAL EXAM  Vitals:   07/14/23 1128  BP: 117/77  Pulse: 85  Weight: 243 lb (110.2 kg)  Height: 5\' 10"  (1.778 m)   Body mass index is 34.87 kg/m.   PHYSICAL EXAMNIATION:  Gen: NAD, conversant, well nourised, well groomed                     Cardiovascular: Regular rate rhythm, no peripheral edema, warm, nontender. Eyes: Conjunctivae clear without exudates or hemorrhage Neck: Supple, no carotid bruits. Pulmonary: Clear to auscultation bilaterally   NEUROLOGICAL EXAM:  MENTAL STATUS: Speech/cognition: Awake, alert oriented to history taking and casual conversation  CRANIAL NERVES: CN II: Visual fields are full to confrontation.  Pupils are round equal and briskly reactive to light. CN III,  IV, VI: extraocular movement are normal. No ptosis. CN V: Facial sensation is intact to pinprick in all 3 divisions bilaterally. Corneal responses are intact.  CN VII: Face is symmetric with normal eye closure and smile. CN VIII: Hearing is normal to casual conversation CN IX, X: Palate elevates symmetrically. Phonation is normal. CN XI: Head turning and shoulder shrug are intact   MOTOR: Normal muscle tone, strength,  REFLEXES: Reflexes are 2+ and symmetric at the biceps, triceps, knees, and ankles. Plantar responses are flexor.  SENSORY: Intact to light touch, pinprick, positional and vibratory sensation are intact in fingers and toes.  COORDINATION: Rapid alternating movements and fine finger movements are intact. There is no dysmetria on finger-to-nose and heel-knee-shin.    GAIT/STANCE: Push-up to get up from seated position, unsteady, cautious    DIAGNOSTIC DATA (LABS, IMAGING, TESTING) - I reviewed patient records, labs, notes, testing and imaging myself where available.  Lab Results  Component Value Date   WBC 4.8 02/05/2022   HGB 17.3 (H) 02/05/2022   HCT 49.0 02/05/2022   MCV 93.0 02/05/2022   PLT 233 02/05/2022      Component Value Date/Time   NA 138 02/05/2022 0509   K 4.4 02/05/2022 0509   CL 102 02/05/2022 0509   CO2 27 02/05/2022 0509   GLUCOSE 127 (H) 02/05/2022 0509   BUN 23 02/05/2022 0509   CREATININE 1.12 02/05/2022 0509   CREATININE 1.00 10/07/2016 1040   CALCIUM 10.0 02/05/2022 0509   PROT 6.3 (L) 01/28/2022 0509   ALBUMIN 3.4 (L) 01/28/2022 0509   AST 38 01/28/2022 0509   ALT 54 (H) 01/28/2022 0509   ALKPHOS 65 01/28/2022 0509   BILITOT 0.7 01/28/2022 0509   GFRNONAA >60 02/05/2022 0509   GFRNONAA 78 10/07/2016 1040  GFRAA >89 10/07/2016 1040   Lab Results  Component Value Date   CHOL 149 01/21/2022   HDL 33 (L) 01/21/2022   LDLCALC 90 01/21/2022   TRIG 131 01/21/2022   CHOLHDL 4.5 01/21/2022   Lab Results  Component Value Date    HGBA1C 5.8 (H) 01/21/2022   Lab Results  Component Value Date   VITAMINB12 301 01/21/2022   Lab Results  Component Value Date   TSH 0.73 10/07/2016   Levert Feinstein, M.D. Ph.D.  [facility] [address] [place], [place] [address] Phone: [phone] Fax:      [phone]

## 2023-10-03 ENCOUNTER — Other Ambulatory Visit (HOSPITAL_COMMUNITY): Payer: Self-pay

## 2023-10-03 DIAGNOSIS — R319 Hematuria, unspecified: Secondary | ICD-10-CM

## 2023-10-22 NOTE — Telephone Encounter (Signed)
 Spoke with patient, he is going to get the Covid vaccine at his local pharmacy.

## 2023-10-24 ENCOUNTER — Telehealth: Payer: Self-pay | Admitting: Allergy & Immunology

## 2023-10-24 NOTE — Telephone Encounter (Signed)
 Pt stated he stays in side now and he don't need the referral.

## 2023-11-20 ENCOUNTER — Ambulatory Visit (HOSPITAL_COMMUNITY): Admission: RE | Admit: 2023-11-20 | Source: Ambulatory Visit

## 2023-11-20 ENCOUNTER — Encounter (HOSPITAL_COMMUNITY): Payer: Self-pay

## 2023-12-26 ENCOUNTER — Ambulatory Visit (HOSPITAL_COMMUNITY)
Admission: RE | Admit: 2023-12-26 | Discharge: 2023-12-26 | Disposition: A | Discharge status: Home / Self Care | Source: Ambulatory Visit

## 2023-12-26 DIAGNOSIS — R319 Hematuria, unspecified: Secondary | ICD-10-CM | POA: Insufficient documentation

## 2023-12-26 LAB — POCT I-STAT CREATININE: Creatinine, Ser: 1 mg/dL (ref 0.61–1.24)

## 2023-12-26 MED ORDER — IOHEXOL 350 MG/ML SOLN: 125.0000 mL | Freq: Once | INTRAVENOUS | Status: DC | PRN

## 2023-12-26 MED ORDER — IOHEXOL 300 MG/ML  SOLN
125.0000 mL | Freq: Once | INTRAMUSCULAR | Status: AC | PRN
  Administered 2023-12-26: 125 mL via INTRAVENOUS

## 2024-01-21 ENCOUNTER — Encounter (INDEPENDENT_AMBULATORY_CARE_PROVIDER_SITE_OTHER): Payer: Self-pay | Admitting: Gastroenterology

## 2024-02-27 ENCOUNTER — Other Ambulatory Visit (HOSPITAL_COMMUNITY): Payer: Self-pay

## 2024-02-27 DIAGNOSIS — M79662 Pain in left lower leg: Secondary | ICD-10-CM

## 2024-03-09 ENCOUNTER — Ambulatory Visit (HOSPITAL_COMMUNITY)
Admission: RE | Admit: 2024-03-09 | Discharge: 2024-03-09 | Disposition: A | Discharge status: Home / Self Care | Source: Ambulatory Visit

## 2024-03-09 ENCOUNTER — Other Ambulatory Visit (HOSPITAL_COMMUNITY): Payer: Self-pay

## 2024-03-09 DIAGNOSIS — M79662 Pain in left lower leg: Secondary | ICD-10-CM | POA: Insufficient documentation
